# Patient Record
Sex: Male | Born: 1945 | Race: White | Hispanic: No | Smoking: Former smoker
Health system: Southern US, Community
[De-identification: ages and names within clinical notes are randomized; demographics above are authoritative.]

## PROBLEM LIST (undated history)

## (undated) DIAGNOSIS — F1911 Other psychoactive substance abuse, in remission: Secondary | ICD-10-CM

## (undated) DIAGNOSIS — G8929 Other chronic pain: Secondary | ICD-10-CM

## (undated) DIAGNOSIS — Z95810 Presence of automatic (implantable) cardiac defibrillator: Secondary | ICD-10-CM

## (undated) DIAGNOSIS — E079 Disorder of thyroid, unspecified: Secondary | ICD-10-CM

## (undated) DIAGNOSIS — K635 Polyp of colon: Secondary | ICD-10-CM

## (undated) DIAGNOSIS — F329 Major depressive disorder, single episode, unspecified: Secondary | ICD-10-CM

## (undated) DIAGNOSIS — M545 Low back pain, unspecified: Secondary | ICD-10-CM

## (undated) DIAGNOSIS — K219 Gastro-esophageal reflux disease without esophagitis: Secondary | ICD-10-CM

## (undated) DIAGNOSIS — L03115 Cellulitis of right lower limb: Secondary | ICD-10-CM

## (undated) DIAGNOSIS — Z8674 Personal history of sudden cardiac arrest: Secondary | ICD-10-CM

## (undated) DIAGNOSIS — K5790 Diverticulosis of intestine, part unspecified, without perforation or abscess without bleeding: Secondary | ICD-10-CM

## (undated) DIAGNOSIS — L02415 Cutaneous abscess of right lower limb: Secondary | ICD-10-CM

## (undated) DIAGNOSIS — I10 Essential (primary) hypertension: Secondary | ICD-10-CM

## (undated) DIAGNOSIS — H269 Unspecified cataract: Secondary | ICD-10-CM

## (undated) DIAGNOSIS — F32A Depression, unspecified: Secondary | ICD-10-CM

## (undated) DIAGNOSIS — G4700 Insomnia, unspecified: Secondary | ICD-10-CM

## (undated) DIAGNOSIS — B1920 Unspecified viral hepatitis C without hepatic coma: Secondary | ICD-10-CM

## (undated) HISTORY — DX: Diverticulosis of intestine, part unspecified, without perforation or abscess without bleeding: K57.90

## (undated) HISTORY — DX: Polyp of colon: K63.5

## (undated) HISTORY — DX: Gastro-esophageal reflux disease without esophagitis: K21.9

## (undated) HISTORY — DX: Disorder of thyroid, unspecified: E07.9

## (undated) HISTORY — PX: JOINT REPLACEMENT: SHX530

## (undated) HISTORY — DX: Depression, unspecified: F32.A

## (undated) HISTORY — DX: Low back pain, unspecified: M54.50

## (undated) HISTORY — DX: Major depressive disorder, single episode, unspecified: F32.9

## (undated) HISTORY — DX: Other chronic pain: G89.29

## (undated) HISTORY — DX: Low back pain: M54.5

## (undated) HISTORY — DX: Insomnia, unspecified: G47.00

## (undated) HISTORY — PX: CARDIAC CATHETERIZATION: SHX172

## (undated) HISTORY — DX: Unspecified cataract: H26.9

## (undated) HISTORY — DX: Essential (primary) hypertension: I10

---

## 2003-11-24 ENCOUNTER — Emergency Department (HOSPITAL_COMMUNITY): Admission: EM | Admit: 2003-11-24 | Discharge: 2003-11-24 | Payer: Self-pay | Admitting: Emergency Medicine

## 2003-12-07 ENCOUNTER — Ambulatory Visit: Payer: Self-pay | Admitting: Internal Medicine

## 2003-12-12 ENCOUNTER — Ambulatory Visit: Payer: Self-pay | Admitting: Internal Medicine

## 2003-12-13 ENCOUNTER — Ambulatory Visit: Payer: Self-pay | Admitting: *Deleted

## 2004-01-28 ENCOUNTER — Ambulatory Visit: Payer: Self-pay | Admitting: Internal Medicine

## 2004-01-29 ENCOUNTER — Ambulatory Visit: Payer: Self-pay | Admitting: Internal Medicine

## 2004-03-12 ENCOUNTER — Ambulatory Visit: Payer: Self-pay | Admitting: Internal Medicine

## 2004-04-10 ENCOUNTER — Ambulatory Visit: Payer: Self-pay | Admitting: Internal Medicine

## 2004-05-19 ENCOUNTER — Ambulatory Visit: Payer: Self-pay | Admitting: Internal Medicine

## 2004-05-20 ENCOUNTER — Ambulatory Visit (HOSPITAL_COMMUNITY): Admission: RE | Admit: 2004-05-20 | Discharge: 2004-05-20 | Payer: Self-pay | Admitting: Internal Medicine

## 2004-06-13 ENCOUNTER — Ambulatory Visit: Payer: Self-pay | Admitting: Internal Medicine

## 2004-07-03 ENCOUNTER — Ambulatory Visit: Payer: Self-pay | Admitting: Internal Medicine

## 2004-07-22 ENCOUNTER — Ambulatory Visit: Payer: Self-pay | Admitting: Internal Medicine

## 2004-07-30 ENCOUNTER — Encounter: Payer: Self-pay | Admitting: Cardiology

## 2004-07-30 ENCOUNTER — Ambulatory Visit (HOSPITAL_COMMUNITY): Admission: RE | Admit: 2004-07-30 | Discharge: 2004-07-30 | Payer: Self-pay | Admitting: Internal Medicine

## 2004-07-30 ENCOUNTER — Ambulatory Visit: Payer: Self-pay | Admitting: Cardiology

## 2004-08-14 ENCOUNTER — Ambulatory Visit: Payer: Self-pay | Admitting: Internal Medicine

## 2004-09-18 ENCOUNTER — Ambulatory Visit: Payer: Self-pay | Admitting: Nurse Practitioner

## 2004-09-18 ENCOUNTER — Ambulatory Visit (HOSPITAL_COMMUNITY): Admission: RE | Admit: 2004-09-18 | Discharge: 2004-09-18 | Payer: Self-pay | Admitting: Nurse Practitioner

## 2004-09-24 ENCOUNTER — Ambulatory Visit: Payer: Self-pay | Admitting: Internal Medicine

## 2004-09-30 ENCOUNTER — Ambulatory Visit (HOSPITAL_COMMUNITY): Admission: RE | Admit: 2004-09-30 | Discharge: 2004-09-30 | Payer: Self-pay

## 2004-10-16 ENCOUNTER — Ambulatory Visit: Payer: Self-pay | Admitting: Internal Medicine

## 2004-10-23 ENCOUNTER — Ambulatory Visit: Payer: Self-pay | Admitting: Internal Medicine

## 2004-12-25 ENCOUNTER — Ambulatory Visit: Payer: Self-pay | Admitting: Family Medicine

## 2004-12-29 ENCOUNTER — Ambulatory Visit: Payer: Self-pay | Admitting: Internal Medicine

## 2005-05-18 ENCOUNTER — Ambulatory Visit: Payer: Self-pay | Admitting: Internal Medicine

## 2005-06-23 ENCOUNTER — Ambulatory Visit (HOSPITAL_COMMUNITY): Admission: RE | Admit: 2005-06-23 | Discharge: 2005-06-23 | Payer: Self-pay | Admitting: Internal Medicine

## 2005-08-04 ENCOUNTER — Ambulatory Visit: Payer: Self-pay | Admitting: Internal Medicine

## 2005-08-20 ENCOUNTER — Ambulatory Visit: Payer: Self-pay | Admitting: Internal Medicine

## 2005-08-20 ENCOUNTER — Ambulatory Visit (HOSPITAL_COMMUNITY): Admission: RE | Admit: 2005-08-20 | Discharge: 2005-08-20 | Payer: Self-pay | Admitting: Internal Medicine

## 2005-09-04 ENCOUNTER — Ambulatory Visit: Payer: Self-pay | Admitting: *Deleted

## 2005-11-25 ENCOUNTER — Encounter (INDEPENDENT_AMBULATORY_CARE_PROVIDER_SITE_OTHER): Payer: Self-pay | Admitting: Internal Medicine

## 2005-11-25 LAB — CONVERTED CEMR LAB: PSA: 0.72 ng/mL

## 2005-12-07 ENCOUNTER — Ambulatory Visit: Payer: Self-pay | Admitting: Internal Medicine

## 2006-09-20 ENCOUNTER — Encounter (INDEPENDENT_AMBULATORY_CARE_PROVIDER_SITE_OTHER): Payer: Self-pay | Admitting: Internal Medicine

## 2006-09-20 DIAGNOSIS — E039 Hypothyroidism, unspecified: Secondary | ICD-10-CM | POA: Insufficient documentation

## 2006-09-20 DIAGNOSIS — F17200 Nicotine dependence, unspecified, uncomplicated: Secondary | ICD-10-CM | POA: Insufficient documentation

## 2006-09-20 DIAGNOSIS — Z8679 Personal history of other diseases of the circulatory system: Secondary | ICD-10-CM | POA: Insufficient documentation

## 2006-09-20 DIAGNOSIS — M2550 Pain in unspecified joint: Secondary | ICD-10-CM | POA: Insufficient documentation

## 2006-09-20 DIAGNOSIS — I10 Essential (primary) hypertension: Secondary | ICD-10-CM | POA: Insufficient documentation

## 2006-12-08 ENCOUNTER — Encounter (INDEPENDENT_AMBULATORY_CARE_PROVIDER_SITE_OTHER): Payer: Self-pay | Admitting: *Deleted

## 2008-03-23 HISTORY — PX: TOTAL SHOULDER REPLACEMENT: SUR1217

## 2009-03-05 ENCOUNTER — Inpatient Hospital Stay (HOSPITAL_COMMUNITY): Admission: RE | Admit: 2009-03-05 | Discharge: 2009-03-08 | Payer: Self-pay | Admitting: Orthopedic Surgery

## 2010-06-23 LAB — CBC
Hemoglobin: 9.8 g/dL — ABNORMAL LOW (ref 13.0–17.0)
MCHC: 34.8 g/dL (ref 30.0–36.0)
MCV: 92.1 fL (ref 78.0–100.0)
RBC: 3.04 MIL/uL — ABNORMAL LOW (ref 4.22–5.81)
WBC: 7 10*3/uL (ref 4.0–10.5)

## 2010-06-23 LAB — BASIC METABOLIC PANEL
CO2: 26 mEq/L (ref 19–32)
Calcium: 7.8 mg/dL — ABNORMAL LOW (ref 8.4–10.5)
Chloride: 104 mEq/L (ref 96–112)
GFR calc Af Amer: >60 mL/min (ref >60)
Sodium: 134 mEq/L — ABNORMAL LOW (ref 135–145)

## 2010-06-24 LAB — URINE MICROSCOPIC-ADD ON

## 2010-06-24 LAB — ANAEROBIC CULTURE

## 2010-06-24 LAB — URINALYSIS, ROUTINE W REFLEX MICROSCOPIC
Bilirubin Urine: NEGATIVE
Glucose, UA: NEGATIVE mg/dL
Hgb urine dipstick: NEGATIVE
Ketones, ur: NEGATIVE mg/dL
Nitrite: NEGATIVE
Specific Gravity, Urine: 1.018 (ref 1.005–1.030)
pH: 7 (ref 5.0–8.0)

## 2010-06-24 LAB — HEPATIC FUNCTION PANEL
Albumin: 3.6 g/dL (ref 3.5–5.2)
Alkaline Phosphatase: 85 U/L (ref 39–117)
Bilirubin, Direct: 0.3 mg/dL (ref 0.0–0.3)
Total Bilirubin: 0.9 mg/dL (ref 0.3–1.2)

## 2010-06-24 LAB — CBC
Hemoglobin: 15.7 g/dL (ref 13.0–17.0)
MCHC: 35.4 g/dL (ref 30.0–36.0)
MCV: 90.7 fL (ref 78.0–100.0)
Platelets: 177 10*3/uL (ref 150–400)
RBC: 4.9 MIL/uL (ref 4.22–5.81)
RDW: 13.7 % (ref 11.5–15.5)
RDW: 13.8 % (ref 11.5–15.5)
WBC: 6.4 10*3/uL (ref 4.0–10.5)

## 2010-06-24 LAB — BASIC METABOLIC PANEL
BUN: 8 mg/dL (ref 6–23)
BUN: 8 mg/dL (ref 6–23)
CO2: 27 mEq/L (ref 19–32)
CO2: 30 mEq/L (ref 19–32)
Calcium: 7.5 mg/dL — ABNORMAL LOW (ref 8.4–10.5)
Calcium: 7.8 mg/dL — ABNORMAL LOW (ref 8.4–10.5)
Calcium: 7.8 mg/dL — ABNORMAL LOW (ref 8.4–10.5)
Calcium: 9 mg/dL (ref 8.4–10.5)
Chloride: 101 mEq/L (ref 96–112)
Creatinine, Ser: 0.78 mg/dL (ref 0.4–1.5)
Creatinine, Ser: 0.85 mg/dL (ref 0.4–1.5)
GFR calc Af Amer: >60 mL/min (ref >60)
GFR calc Af Amer: >60 mL/min (ref >60)
GFR calc Af Amer: >60 mL/min (ref >60)
GFR calc non Af Amer: >60 mL/min (ref >60)
GFR calc non Af Amer: >60 mL/min (ref >60)
Glucose, Bld: 143 mg/dL — ABNORMAL HIGH (ref 70–99)
Glucose, Bld: 150 mg/dL — ABNORMAL HIGH (ref 70–99)
Glucose, Bld: 93 mg/dL (ref 70–99)
Sodium: 132 mEq/L — ABNORMAL LOW (ref 135–145)
Sodium: 132 mEq/L — ABNORMAL LOW (ref 135–145)
Sodium: 137 mEq/L (ref 135–145)

## 2010-06-24 LAB — DIFFERENTIAL
Basophils Absolute: 0 10*3/uL (ref 0.0–0.1)
Basophils Relative: 1 % (ref 0–1)
Eosinophils Absolute: 0.1 10*3/uL (ref 0.0–0.7)
Eosinophils Relative: 2 % (ref 0–5)
Monocytes Absolute: 0.6 10*3/uL (ref 0.1–1.0)
Monocytes Relative: 13 % — ABNORMAL HIGH (ref 3–12)
Neutro Abs: 2.9 10*3/uL (ref 1.7–7.7)

## 2010-06-24 LAB — WOUND CULTURE

## 2010-06-24 LAB — TYPE AND SCREEN: Antibody Screen: NEGATIVE

## 2010-06-24 LAB — APTT: aPTT: 28 seconds (ref 24–37)

## 2010-09-22 LAB — HM COLONOSCOPY

## 2010-11-27 ENCOUNTER — Ambulatory Visit (HOSPITAL_COMMUNITY): Payer: Self-pay | Admitting: Dentistry

## 2010-11-27 DIAGNOSIS — Z09 Encounter for follow-up examination after completed treatment for conditions other than malignant neoplasm: Secondary | ICD-10-CM

## 2012-06-11 ENCOUNTER — Other Ambulatory Visit: Payer: Self-pay | Admitting: Physician Assistant

## 2012-06-20 ENCOUNTER — Other Ambulatory Visit: Payer: Self-pay | Admitting: Physician Assistant

## 2012-06-20 DIAGNOSIS — G4700 Insomnia, unspecified: Secondary | ICD-10-CM

## 2012-06-20 NOTE — Telephone Encounter (Signed)
Medication refilled per protocol.Patient needs to be seen before any further refills 

## 2012-06-21 ENCOUNTER — Other Ambulatory Visit: Payer: Self-pay | Admitting: Physician Assistant

## 2012-06-21 NOTE — Telephone Encounter (Signed)
Refill 3/31  denied

## 2012-06-22 ENCOUNTER — Encounter: Payer: Self-pay | Admitting: Family Medicine

## 2012-06-22 NOTE — Telephone Encounter (Signed)
Can you make erroneous encounter please. This was done the other day. Thanks!

## 2012-06-22 NOTE — Telephone Encounter (Signed)
This encounter was created in error - please disregard.

## 2012-07-19 ENCOUNTER — Other Ambulatory Visit: Payer: Self-pay | Admitting: Physician Assistant

## 2012-07-20 ENCOUNTER — Other Ambulatory Visit: Payer: Self-pay | Admitting: Physician Assistant

## 2012-07-20 NOTE — Telephone Encounter (Signed)
Medication refilled per protocol. 

## 2012-09-05 ENCOUNTER — Other Ambulatory Visit: Payer: Self-pay | Admitting: Physician Assistant

## 2012-09-05 NOTE — Telephone Encounter (Signed)
Medication refilled per protocol.Patient needs to be seen before any further refills  Pt called appt made 

## 2012-09-09 ENCOUNTER — Other Ambulatory Visit: Payer: Self-pay | Admitting: Family Medicine

## 2012-09-09 ENCOUNTER — Encounter: Payer: Self-pay | Admitting: Family Medicine

## 2012-09-09 ENCOUNTER — Ambulatory Visit (INDEPENDENT_AMBULATORY_CARE_PROVIDER_SITE_OTHER): Payer: BC Managed Care – PPO | Admitting: Family Medicine

## 2012-09-09 VITALS — BP 110/72 | HR 68 | Temp 97.5°F | Resp 16 | Ht 68.5 in | Wt 187.0 lb

## 2012-09-09 DIAGNOSIS — K219 Gastro-esophageal reflux disease without esophagitis: Secondary | ICD-10-CM

## 2012-09-09 DIAGNOSIS — IMO0002 Reserved for concepts with insufficient information to code with codable children: Secondary | ICD-10-CM

## 2012-09-09 DIAGNOSIS — J449 Chronic obstructive pulmonary disease, unspecified: Secondary | ICD-10-CM

## 2012-09-09 DIAGNOSIS — G4700 Insomnia, unspecified: Secondary | ICD-10-CM

## 2012-09-09 DIAGNOSIS — T148XXA Other injury of unspecified body region, initial encounter: Secondary | ICD-10-CM

## 2012-09-09 DIAGNOSIS — I10 Essential (primary) hypertension: Secondary | ICD-10-CM

## 2012-09-09 DIAGNOSIS — E039 Hypothyroidism, unspecified: Secondary | ICD-10-CM

## 2012-09-09 MED ORDER — TEMAZEPAM 15 MG PO CAPS: 15.0000 mg | ORAL_CAPSULE | Freq: Every evening | ORAL | Status: DC | PRN

## 2012-09-09 NOTE — Patient Instructions (Addendum)
We will call with lab results New sleeping pill Temazepam at bedtime Stop trazodone F/U 3 months for CPE

## 2012-09-10 ENCOUNTER — Other Ambulatory Visit: Payer: Self-pay | Admitting: Family Medicine

## 2012-09-10 LAB — COMPREHENSIVE METABOLIC PANEL
ALT: 57 U/L — ABNORMAL HIGH (ref 0–53)
BUN: 17 mg/dL (ref 6–23)
CO2: 29 mEq/L (ref 19–32)
Calcium: 8.9 mg/dL (ref 8.4–10.5)
Creat: 0.85 mg/dL (ref 0.50–1.35)
Total Bilirubin: 0.7 mg/dL (ref 0.3–1.2)

## 2012-09-10 LAB — CBC
HCT: 39.3 % (ref 39.0–52.0)
Hemoglobin: 13.4 g/dL (ref 13.0–17.0)
MCV: 87.7 fL (ref 78.0–100.0)
RBC: 4.48 MIL/uL (ref 4.22–5.81)
RDW: 13.7 % (ref 11.5–15.5)
WBC: 5.3 10*3/uL (ref 4.0–10.5)

## 2012-09-10 LAB — T4: T4, Total: 23.8 ug/dL — ABNORMAL HIGH (ref 5.0–12.5)

## 2012-09-11 ENCOUNTER — Encounter: Payer: Self-pay | Admitting: Family Medicine

## 2012-09-11 DIAGNOSIS — K219 Gastro-esophageal reflux disease without esophagitis: Secondary | ICD-10-CM | POA: Insufficient documentation

## 2012-09-11 DIAGNOSIS — IMO0002 Reserved for concepts with insufficient information to code with codable children: Secondary | ICD-10-CM | POA: Insufficient documentation

## 2012-09-11 DIAGNOSIS — G4700 Insomnia, unspecified: Secondary | ICD-10-CM | POA: Insufficient documentation

## 2012-09-11 DIAGNOSIS — B1920 Unspecified viral hepatitis C without hepatic coma: Secondary | ICD-10-CM | POA: Insufficient documentation

## 2012-09-11 DIAGNOSIS — J449 Chronic obstructive pulmonary disease, unspecified: Secondary | ICD-10-CM | POA: Insufficient documentation

## 2012-09-11 NOTE — Assessment & Plan Note (Signed)
Currently stable, declines therapy, quit tobacco

## 2012-09-11 NOTE — Assessment & Plan Note (Signed)
Well controlled no change to meds 

## 2012-09-11 NOTE — Assessment & Plan Note (Signed)
Check TFT ,continue synthroid 

## 2012-09-11 NOTE — Assessment & Plan Note (Addendum)
Trial of restoril, stop trazodone Failed ambien, trazodone, could not afford lunesta Other option doxepin or elavil

## 2012-09-11 NOTE — Assessment & Plan Note (Signed)
Pt wishes to use OTC prilosec

## 2012-09-11 NOTE — Assessment & Plan Note (Signed)
Keep clean and bandage

## 2012-09-11 NOTE — Progress Notes (Signed)
  Subjective:    Patient ID: Tommy Robbins, male    DOB: 1945-04-03, 67 y.o.   MRN: [id number]  HPI Pt here to f/u chronic medical problems. He has not been seen in 1 year. Still taking all prescribed medications with exception of trazodone dose. He continues to have insomnia, had SE with ambien, trazodone has not helped so he started tapering off months ago, now down to 50mg  at bedtime.  Due for labs. History of HEP C no current treatment, had interferon in [facility], was receiving treatment 8 years ago when he moved here had SE therefore did not continue treatment. COPD- breathing okay, gets SOB with exertion, could not afford inhalers therefore does not use any.  Still under workman's comp for shoulder injury, in pain clinic getting MS Contin twice a day GERD- Stopped his meds, still has some heartburn  Cut skin on plastic bag a few weeks ago  Review of Systems  GEN- denies fatigue, fever, weight loss,weakness, recent illness HEENT- denies eye drainage, change in vision, nasal discharge, CVS- denies chest pain, palpitations RESP- denies SOB, cough, wheeze ABD- denies N/V, change in stools, abd pain GU- denies dysuria, hematuria, dribbling, incontinence MSK- denies joint pain, muscle aches, injury Neuro- denies headache, dizziness, syncope, seizure activity       Objective:   Physical Exam  GEN- NAD, alert and oriented x3 HEENT- PERRL, EOMI, non injected sclera, pink conjunctiva, MMM, oropharynx clear Neck- Supple, no thyromegaly CVS- RRR, no murmur RESP-CTAB ABD-NABS,soft,NT,ND EXT- No edema Pulses- Radial, DP- 2+ Psych- normal affect and mood Skin- Right forearm- skin tear, no drainage      Assessment & Plan:

## 2012-09-11 NOTE — Assessment & Plan Note (Signed)
Known Hep C had interferon treatments > 8 years, previous LFT elevated , repeat

## 2012-09-12 LAB — HEPATITIS C RNA QUANTITATIVE

## 2012-09-13 ENCOUNTER — Other Ambulatory Visit: Payer: Self-pay | Admitting: Family Medicine

## 2012-10-01 ENCOUNTER — Other Ambulatory Visit: Payer: Self-pay | Admitting: Physician Assistant

## 2012-10-15 ENCOUNTER — Other Ambulatory Visit: Payer: Self-pay | Admitting: Physician Assistant

## 2012-10-17 ENCOUNTER — Other Ambulatory Visit: Payer: Self-pay | Admitting: Physician Assistant

## 2012-10-17 NOTE — Telephone Encounter (Signed)
Medication refilled per protocol. 

## 2012-10-29 ENCOUNTER — Other Ambulatory Visit: Payer: Self-pay | Admitting: Physician Assistant

## 2012-10-31 ENCOUNTER — Encounter: Payer: Self-pay | Admitting: Family Medicine

## 2012-10-31 NOTE — Telephone Encounter (Signed)
Medication refill for one time only.  Patient needs to be seen.  Letter sent for patient to call and schedule 

## 2012-11-09 ENCOUNTER — Ambulatory Visit (INDEPENDENT_AMBULATORY_CARE_PROVIDER_SITE_OTHER): Payer: BC Managed Care – PPO | Admitting: Physician Assistant

## 2012-11-09 ENCOUNTER — Encounter: Payer: Self-pay | Admitting: Physician Assistant

## 2012-11-09 VITALS — BP 106/82 | HR 76 | Temp 98.4°F | Resp 20 | Ht 68.5 in | Wt 186.0 lb

## 2012-11-09 DIAGNOSIS — J449 Chronic obstructive pulmonary disease, unspecified: Secondary | ICD-10-CM

## 2012-11-09 DIAGNOSIS — B1920 Unspecified viral hepatitis C without hepatic coma: Secondary | ICD-10-CM

## 2012-11-09 DIAGNOSIS — K219 Gastro-esophageal reflux disease without esophagitis: Secondary | ICD-10-CM

## 2012-11-09 DIAGNOSIS — I10 Essential (primary) hypertension: Secondary | ICD-10-CM

## 2012-11-09 DIAGNOSIS — E039 Hypothyroidism, unspecified: Secondary | ICD-10-CM

## 2012-11-09 DIAGNOSIS — G4700 Insomnia, unspecified: Secondary | ICD-10-CM

## 2012-11-09 MED ORDER — AMLODIPINE BESYLATE 10 MG PO TABS: ORAL_TABLET | ORAL | Status: DC

## 2012-11-09 MED ORDER — POTASSIUM CHLORIDE CRYS ER 10 MEQ PO TBCR: EXTENDED_RELEASE_TABLET | ORAL | Status: DC

## 2012-11-09 MED ORDER — LISINOPRIL-HYDROCHLOROTHIAZIDE 20-12.5 MG PO TABS: ORAL_TABLET | ORAL | Status: DC

## 2012-11-09 MED ORDER — LEVOTHYROXINE SODIUM 50 MCG PO TABS: 50.0000 ug | ORAL_TABLET | Freq: Every day | ORAL | Status: DC

## 2012-11-09 NOTE — Progress Notes (Signed)
Patient ID: Tommy Robbins MRN: [id number], DOB: 03-23-1946, 67 y.o. Date of Encounter: @DATE @  Chief Complaint:  Chief Complaint  Patient presents with  . told needed appt to follow up???    ??thyroid  needs med refill 90 day    HPI: 67 y.o. year old white male  presents for regular office visit and followup. He has no specific complaints today.  #1 insomnia: He says in the past Ambien worked very well as far as him getting good sleep. However it caused him to have very weird dreams.  He prefers not to go back to Ambien. At his last office visit here with Dr. Jeanice Lim in June she started a trial of Restoril. He says that this is working well and he was continued this for now.  #2 hypothyroid: he is taking this as directed. No complaints of significant change in weight or energy level or changes in hair or skin.  #3 history of hepatitis C: He had interferon treatments in [place] in the past. He was receiving treatment for 8 years ago when he moved here. However he then developed adverse effects to the treatment and stopped the treatment.  #4 COPD he was prescribed some inhalers use in the past he cannot afford these. He has stopped smoking. His shortness of breath and dyspnea on exertion are stable even without treatment.  5 chronic shoulder pain he sees the pain clinic.    Past Medical History  Diagnosis Date  . Hypertension   . GERD (gastroesophageal reflux disease)   . Depression   . Hep C w/ coma, chronic   . Thyroid disease     hypothyroid  . Colon polyps   . Diverticulosis   . Insomnia      Home Meds: See attached medication section for current medication list. Any medications entered into computer today will not appear on this note's list. The medications listed below were entered prior to today. Current Outpatient Prescriptions on File Prior to Visit  Medication Sig Dispense Refill  . morphine (MS CONTIN) 15 MG 12 hr tablet Take 15 mg by mouth 3 (three) times daily.  Takes 75mg  BID      . morphine (MS CONTIN) 60 MG 12 hr tablet Take 60 mg by mouth 3 (three) times daily.       . temazepam (RESTORIL) 15 MG capsule Take 1 capsule (15 mg total) by mouth at bedtime as needed for sleep.  30 capsule  3  . traZODone (DESYREL) 100 MG tablet        No current facility-administered medications on file prior to visit.    Allergies: No Known Allergies  History   Social History  . Marital Status: Divorced    Spouse Name: N/A    Number of Children: N/A  . Years of Education: N/A   Occupational History  . Not on file.   Social History Main Topics  . Smoking status: Former Smoker -- 1.00 packs/day for 50 years    Types: Cigarettes    Quit date: 06/10/2011  . Smokeless tobacco: Not on file  . Alcohol Use: Not on file  . Drug Use: Not on file  . Sexual Activity: Not on file   Other Topics Concern  . Not on file   Social History Narrative  . No narrative on file    No family history on file.   Review of Systems:  See HPI for pertinent ROS. All other ROS negative.    Physical Exam: Blood  pressure 106/82, pulse 76, temperature 98.4 F (36.9 C), temperature source Oral, resp. rate 20, height 5' 8.5" (1.74 m), weight 186 lb (84.369 kg)., Body mass index is 27.87 kg/(m^2). General: well-nourished well-developed white male. Appears in no acute distress. Neck: Supple. No thyromegaly. No lymphadenopathy. Lungs: Clear bilaterally to auscultation without wheezes, rales, or rhonchi. Breathing is unlabored. Heart: RRR with S1 S2. No murmurs, rubs, or gallops. Abdomen: Soft, non-tender, non-distended with normoactive bowel sounds. No hepatomegaly. No rebound/guarding. No obvious abdominal masses. Musculoskeletal:  Strength and tone normal for age. Neuro: Alert and oriented X 3. Moves all extremities spontaneously. Gait is normal. CNII-XII grossly in tact. Psych:  Responds to questions appropriately with a normal affect.     ASSESSMENT AND PLAN:  67 y.o.  year old male with  1. HYPERTENSION, BENIGN ESSENTIAL At goal. Continue current medications. Bmet normal 6/14. - amLODipine (NORVASC) 10 MG tablet; TAKE ONE TABLET BY MOUTH ONCE DAILY  Dispense: 90 tablet; Refill: 3 - potassium chloride (KLOR-CON M10) 10 MEQ tablet; TAKE ONE TABLET BY MOUTH EVERY DAY  Dispense: 90 tablet; Refill: 3 - lisinopril-hydrochlorothiazide (PRINZIDE,ZESTORETIC) 20-12.5 MG per tablet; TAKE TWO TABLETS BY MOUTH ONCE DAILY  Dispense: 180 tablet; Refill: 3  2. HYPOTHYROIDISM NOS TSH within normal limits 614. Continue current dose. - levothyroxine (SYNTHROID, LEVOTHROID) 50 MCG tablet; Take 1 tablet (50 mcg total) by mouth daily before breakfast.  Dispense: 90 tablet; Refill: 3  3. Insomnia Continue Restoril when necessary.  4. COPD (chronic obstructive pulmonary disease) Stable off of medication. He did stop smoking one year ago and has remained off of cigarettes.  5. GERD (gastroesophageal reflux disease) Controlled with current medication.  6. Hepatitis C He did not complete therapy for this in the past secondary to adverse effects.   Murray Hodgkins [place], [place], [facility] 11/09/2012 3:13 PM

## 2013-01-04 ENCOUNTER — Telehealth: Payer: Self-pay | Admitting: Family Medicine

## 2013-01-04 MED ORDER — TEMAZEPAM 15 MG PO CAPS: 15.0000 mg | ORAL_CAPSULE | Freq: Every evening | ORAL | Status: DC | PRN

## 2013-01-04 NOTE — Telephone Encounter (Signed)
Okay to refill? 

## 2013-01-04 NOTE — Telephone Encounter (Signed)
Med phoned in °

## 2013-02-25 ENCOUNTER — Other Ambulatory Visit: Payer: Self-pay | Admitting: Physician Assistant

## 2013-02-27 NOTE — Telephone Encounter (Signed)
Medication refilled per protocol. 

## 2013-03-06 ENCOUNTER — Telehealth: Payer: Self-pay | Admitting: Family Medicine

## 2013-03-06 NOTE — Telephone Encounter (Signed)
Pt call back number [phone] Pt is needing you to call him because he states that the pharmacy told him that he need to call up here and tell us to call his insurance company about his medication (omeprazole)

## 2013-03-07 NOTE — Telephone Encounter (Signed)
Pt is calling back from yesterday and he would love a call back today he said

## 2013-03-08 NOTE — Telephone Encounter (Signed)
Called number listed the number is disconnected

## 2013-03-10 NOTE — Telephone Encounter (Signed)
Insurance still could not give me an answer.  States now it must go to a Well[employer] geologist and we will have answer in 24-48 hrs.  Pt aware of status

## 2013-03-10 NOTE — Telephone Encounter (Signed)
Still have not received prior auth from [employer].  Was sent in 03/01/13.  Told patient to call insurance to follow up on PA

## 2013-03-10 NOTE — Telephone Encounter (Signed)
I have contacted insurance company.  They are having problems at their end.  Told them we have been working on this for TWO weeks.  Still unable to give me an answer on OMEPRAZOLE!!!!! They asked that I try to call back in 3-4 hrs.  Pt informed

## 2013-03-21 ENCOUNTER — Telehealth: Payer: Self-pay | Admitting: Family Medicine

## 2013-03-21 MED ORDER — PANTOPRAZOLE SODIUM 40 MG PO TBEC: 40.0000 mg | DELAYED_RELEASE_TABLET | Freq: Every day | ORAL | Status: DC

## 2013-03-21 NOTE — Telephone Encounter (Signed)
Finally heard from insurance about prior auth for Omeprazole.  Omeprazole denied due to patient symptomtology.  Require high dose H2 blocker.  Per provider Protonix 40 mg to be ordered.  Pt called and made aware

## 2013-05-10 ENCOUNTER — Ambulatory Visit (INDEPENDENT_AMBULATORY_CARE_PROVIDER_SITE_OTHER): Payer: BC Managed Care – PPO | Admitting: Family Medicine

## 2013-05-10 ENCOUNTER — Telehealth: Payer: Self-pay | Admitting: *Deleted

## 2013-05-10 ENCOUNTER — Encounter: Payer: Self-pay | Admitting: Family Medicine

## 2013-05-10 VITALS — BP 108/70 | HR 70 | Temp 97.8°F | Resp 18 | Ht 67.0 in | Wt 185.0 lb

## 2013-05-10 DIAGNOSIS — J441 Chronic obstructive pulmonary disease with (acute) exacerbation: Secondary | ICD-10-CM

## 2013-05-10 MED ORDER — ALBUTEROL SULFATE HFA 108 (90 BASE) MCG/ACT IN AERS: 2.0000 | INHALATION_SPRAY | RESPIRATORY_TRACT | Status: DC | PRN

## 2013-05-10 MED ORDER — PREDNISONE 10 MG PO TABS: ORAL_TABLET | ORAL | Status: DC

## 2013-05-10 MED ORDER — DOXYCYCLINE HYCLATE 100 MG PO TABS: 100.0000 mg | ORAL_TABLET | Freq: Two times a day (BID) | ORAL | Status: DC

## 2013-05-10 MED ORDER — METHYLPREDNISOLONE ACETATE 40 MG/ML IJ SUSP
40.0000 mg | Freq: Once | INTRAMUSCULAR | Status: AC
  Administered 2013-05-10: 40 mg via INTRAMUSCULAR

## 2013-05-10 NOTE — Telephone Encounter (Signed)
Received VM from pt. Returned call. Reported that he has SOB and cough. Requested appointment. Appointment scheduled for 05/10/2013 @ 11:30am.

## 2013-05-10 NOTE — Progress Notes (Signed)
Patient ID: Tommy Robbins, male   DOB: Oct 27, 1945, 68 y.o.   MRN: [id number]   Subjective:    Patient ID: Tommy Robbins, male    DOB: 1945-08-12, 68 y.o.   MRN: [id number]  Patient presents for Cough and Shortness of Breath  patient here with cough shortness of breath and wheezing for the past 3 days. 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 He's not been on any maintenance inhalers for the past 2 years. He states that he was out in the cold on Sunday move in some wood when he began to have some cough. It subsequently progressed to shortness of breath and wheezing when he had difficulty walking up the steps. He has not taken any over-the-counter medications. He has not had any fever. He's not had any chest pain.    Review Of Systems:  GEN- denies fatigue, fever, weight loss,weakness, recent illness HEENT- denies eye drainage, change in vision, nasal discharge, CVS- denies chest pain, palpitations RESP- +SOB, +cough, +wheeze ABD- denies N/V, change in stools, abd pain Neuro- denies headache, dizziness, syncope, seizure activity       Objective:    BP 108/70  Pulse 70  Temp(Src) 97.8 F (36.6 C) (Oral)  Resp 18  Ht 5\' 7"  (1.702 m)  Wt 185 lb (83.915 kg)  BMI 28.97 kg/m2  SpO2 94% GEN- NAD, alert and oriented x3 HEENT- PERRL, EOMI, non injected sclera, pink conjunctiva, MMM, oropharynx clear Neck- Supple, no LAD CVS- RRR, no murmur RESP-decreased air movement bilat, +wheeze bilat, no retractions, sat 94%, +rhochi lower bases EXT- No edema Pulses- Radial 2+  S/p neb improved air movement, decreased wheeze, sat 94%      Assessment & Plan:      Problem List Items Addressed This Visit   COPD exacerbation - Primary     Given duo neb in the office. He was also given Depo-Medrol 40 mg IM for exacerbation. He will start prednisone taper tomorrow as well as doxycycline He's been given prescription for albuterol every 4 hours as needed He will followup Friday for recheck Will  likely need to start him on a maintenance medication such as Advair as he was on this in the past and did well on this.    Relevant Medications      predniSONE (DELTASONE) tablet      ALBUTEROL SULFATE HFA 108 (90 BASE) MCG/ACT IN AERS      Note: This dictation was prepared with Dragon dictation along with smaller phrase technology. Any transcriptional errors that result from this process are unintentional.

## 2013-05-10 NOTE — Addendum Note (Signed)
Addended by: Elvina Mattes T on: 05/10/2013 03:02 PM   Modules accepted: Orders

## 2013-05-10 NOTE — Patient Instructions (Signed)
Start prednisone tablets tomorrow Use the albuterol inhaler 2 puffs every 4 hours while awake Start antibiotics doxycycline today Followup on Friday for recheck on your breathing

## 2013-05-10 NOTE — Assessment & Plan Note (Signed)
Given duo neb in the office. He was also given Depo-Medrol 40 mg IM for exacerbation. He will start prednisone taper tomorrow as well as doxycycline He's been given prescription for albuterol every 4 hours as needed He will followup Friday for recheck Will likely need to start him on a maintenance medication such as Advair as he was on this in the past and did well on this.

## 2013-05-12 ENCOUNTER — Encounter: Payer: Self-pay | Admitting: Family Medicine

## 2013-05-12 ENCOUNTER — Telehealth: Payer: Self-pay | Admitting: *Deleted

## 2013-05-12 ENCOUNTER — Ambulatory Visit (INDEPENDENT_AMBULATORY_CARE_PROVIDER_SITE_OTHER): Payer: BC Managed Care – PPO | Admitting: Family Medicine

## 2013-05-12 VITALS — BP 120/70 | HR 90 | Temp 97.7°F | Resp 16 | Ht 67.0 in | Wt 184.0 lb

## 2013-05-12 DIAGNOSIS — J441 Chronic obstructive pulmonary disease with (acute) exacerbation: Secondary | ICD-10-CM

## 2013-05-12 MED ORDER — TEMAZEPAM 15 MG PO CAPS: 15.0000 mg | ORAL_CAPSULE | Freq: Every evening | ORAL | Status: DC | PRN

## 2013-05-12 NOTE — Telephone Encounter (Signed)
Okay, given 3 refills

## 2013-05-12 NOTE — Telephone Encounter (Signed)
Med phoned in °

## 2013-05-12 NOTE — Telephone Encounter (Signed)
Ok to refill 

## 2013-05-12 NOTE — Patient Instructions (Signed)
Continue the current medications Stay inside Okay to humidify the air  Mucinex or robitussin for the mucous Start advair on Monday Call if advair is helping then we will send prescriptions  F/U 2 months

## 2013-05-12 NOTE — Assessment & Plan Note (Signed)
His exam is much improved today I will have him continue the prednisone taper as well as antibiotics in the inhalers. He will start Advair 250/50 on Monday. We will followup in 2 weeks by phone to see how he is doing with the maintenance inhaler

## 2013-05-12 NOTE — Progress Notes (Signed)
Patient ID: Tommy Robbins, male   DOB: [birth year], 68 y.o.   MRN: [id number]   Subjective:    Patient ID: Tommy Robbins, male    DOB: 04/21/1945, 68 y.o.   MRN: [id number]  Patient presents for follow up from Wednesday and Chest Pain  Patient here for 48 hour followup for COPD exacerbation. He states that he still feels short of breath when he is walking up steps and try to do activities but he is not coughing and wheezing as much. He is also able to get some of the sputum up. He is using his inhaler as prescribed typically 3 times a day. He is currently on 40 mg of prednisone.   Review Of Systems:  GEN- denies fatigue, fever, weight loss,weakness, recent illness HEENT- denies eye drainage, change in vision, nasal discharge, CVS- denies chest pain, palpitations RESP- + SOB, +cough, +wheeze        Objective:    BP 120/70  Pulse 90  Temp(Src) 97.7 F (36.5 C) (Oral)  Resp 16  Ht 5\' 7"  (1.702 m)  Wt 184 lb (83.462 kg)  BMI 28.81 kg/m2  SpO2 94% GEN- NAD, alert and oriented x3 HEENT- PERRL, EOMI, non injected sclera, pink conjunctiva, MMM, oropharynx clear Neck- no retractions CVS- RRR, no murmur RESP-few scattered wheeze, good air movement, mild rhonchi walking sat 91%, at rest 95% Pulses- Radial 2+        Assessment & Plan:      Problem List Items Addressed This Visit   None      Note: This dictation was prepared with Dragon dictation along with smaller phrase technology. Any transcriptional errors that result from this process are unintentional.

## 2013-06-01 ENCOUNTER — Telehealth: Payer: Self-pay | Admitting: *Deleted

## 2013-06-01 MED ORDER — FLUTICASONE-SALMETEROL 250-50 MCG/DOSE IN AEPB: 1.0000 | INHALATION_SPRAY | Freq: Two times a day (BID) | RESPIRATORY_TRACT | Status: DC

## 2013-06-01 NOTE — Telephone Encounter (Signed)
Message copied by Phillips Odor on Thu Jun 01, 2013  9:38 AM ------      Message from: Milinda Antis F      Created: Wed May 31, 2013 10:29 PM      Regarding: FW: f/u advair, see if script needed       Please call pt and see if advair helped, if so go ahead and send in script, he was given samples from the office                  ----- Message -----         From: Salley Scarlet, MD         Sent: 05/29/2013           To: Salley Scarlet, MD      Subject: f/u advair, see if script needed                                ------

## 2013-06-01 NOTE — Telephone Encounter (Signed)
Call placed to patient.   States that he feels his breathing has improved with Advair.   Prescription sent to pharmacy.

## 2013-07-10 ENCOUNTER — Telehealth: Payer: Self-pay | Admitting: Family Medicine

## 2013-07-10 MED ORDER — PANTOPRAZOLE SODIUM 40 MG PO TBEC: 40.0000 mg | DELAYED_RELEASE_TABLET | Freq: Every day | ORAL | Status: DC

## 2013-07-10 NOTE — Telephone Encounter (Signed)
Call back number is [phone]  [phone] Pt is needing a refill on Amporzole

## 2013-07-10 NOTE — Telephone Encounter (Signed)
Refill appropriate and filled per protocol. 

## 2013-07-11 ENCOUNTER — Telehealth: Payer: Self-pay | Admitting: *Deleted

## 2013-07-11 MED ORDER — OMEPRAZOLE 20 MG PO CPDR: 20.0000 mg | DELAYED_RELEASE_CAPSULE | Freq: Every day | ORAL | Status: DC

## 2013-07-11 NOTE — Telephone Encounter (Signed)
Received fax from pharmacy stating Protonix is not covered by patient insurance and requesting PA.   MD made aware and new orders obtained to change prescription to Omeprazole.   Prescription sent to pharmacy.

## 2013-07-12 ENCOUNTER — Telehealth: Payer: Self-pay | Admitting: *Deleted

## 2013-07-12 MED ORDER — PANTOPRAZOLE SODIUM 40 MG PO TBEC: 40.0000 mg | DELAYED_RELEASE_TABLET | Freq: Every day | ORAL | Status: DC

## 2013-07-12 NOTE — Telephone Encounter (Signed)
Received PA request for Omeprazole from pharmacy.   Was advised that insurance does not cover daily Omeprazole.   Medication had been changed to Omeprazole from Protonix due to insurance not covering medication.   PA submitted for Protonix.

## 2013-07-18 NOTE — Telephone Encounter (Signed)
PA for Pantroprazole approved.   Case ID: [id number]  06/21/2013- 07/18/2014.  Pharmacy made aware.

## 2013-09-06 ENCOUNTER — Encounter: Payer: Self-pay | Admitting: Physician Assistant

## 2013-09-06 ENCOUNTER — Encounter: Payer: Self-pay | Admitting: Family Medicine

## 2013-09-06 ENCOUNTER — Ambulatory Visit (INDEPENDENT_AMBULATORY_CARE_PROVIDER_SITE_OTHER): Payer: BC Managed Care – PPO | Admitting: Physician Assistant

## 2013-09-06 VITALS — BP 110/74 | HR 68 | Temp 98.2°F | Resp 18 | Wt 192.0 lb

## 2013-09-06 DIAGNOSIS — B3789 Other sites of candidiasis: Secondary | ICD-10-CM

## 2013-09-06 MED ORDER — NYSTATIN 100000 UNIT/GM EX POWD: Freq: Four times a day (QID) | CUTANEOUS | Status: DC

## 2013-09-06 NOTE — Progress Notes (Signed)
Patient ID: Tommy MinerRichard M Robbins MRN: [id number], DOB: 10/18/1945, 68 y.o. Date of Encounter: 09/06/2013, 12:17 PM    Chief Complaint:  Chief Complaint  Patient presents with  . oozy rash in groin area     HPI: 68 y.o. year old white male is that this rash started on the morning of Monday 09/04/13. Started at the right groin and now also has the same type rash on the left groin. Says he has been doing some yard work and being outside sweaty quite a bit.     Home Meds:   Outpatient Prescriptions Prior to Visit  Medication Sig Dispense Refill  . albuterol (PROVENTIL HFA;VENTOLIN HFA) 108 (90 BASE) MCG/ACT inhaler Inhale 2 puffs into the lungs every 4 (four) hours as needed for wheezing or shortness of breath.  1 Inhaler  3  . amLODipine (NORVASC) 10 MG tablet TAKE ONE TABLET BY MOUTH ONCE DAILY  90 tablet  3  . Fluticasone-Salmeterol (ADVAIR) 250-50 MCG/DOSE AEPB Inhale 1 puff into the lungs 2 (two) times daily.  60 each  1  . levothyroxine (SYNTHROID, LEVOTHROID) 50 MCG tablet Take 1 tablet (50 mcg total) by mouth daily before breakfast.  90 tablet  3  . lisinopril-hydrochlorothiazide (PRINZIDE,ZESTORETIC) 20-12.5 MG per tablet TAKE TWO TABLETS BY MOUTH ONCE DAILY  180 tablet  3  . morphine (MS CONTIN) 15 MG 12 hr tablet Take 15 mg by mouth 3 (three) times daily. Takes 75mg  BID      . morphine (MS CONTIN) 60 MG 12 hr tablet Take 60 mg by mouth 3 (three) times daily.       . pantoprazole (PROTONIX) 40 MG tablet Take 1 tablet (40 mg total) by mouth daily.  30 tablet  3  . potassium chloride (KLOR-CON M10) 10 MEQ tablet TAKE ONE TABLET BY MOUTH EVERY DAY  90 tablet  3  . temazepam (RESTORIL) 15 MG capsule Take 1 capsule (15 mg total) by mouth at bedtime as needed for sleep.  30 capsule  3  . doxycycline (VIBRA-TABS) 100 MG tablet Take 1 tablet (100 mg total) by mouth 2 (two) times daily.  14 tablet  0  . predniSONE (DELTASONE) 10 MG tablet Take 40mg  x 3 days,20mg  x 3 days, 10mg  x 3 days  21  tablet  0  . traZODone (DESYREL) 100 MG tablet        No facility-administered medications prior to visit.    Allergies: No Known Allergies    Review of Systems: See HPI for pertinent ROS. All other ROS negative.    Physical Exam: Blood pressure 110/74, pulse 68, temperature 98.2 F (36.8 C), temperature source Oral, resp. rate 18, weight 192 lb (87.091 kg)., Body mass index is 30.06 kg/(m^2). General: WNWD WM. Appears in no acute distress. Lungs: Clear bilaterally to auscultation without wheezes, rales, or rhonchi. Breathing is unlabored. Heart: Regular rhythm. No murmurs, rubs, or gallops. Msk:  Strength and tone normal for age. Skin: Bilateral Groins, in the creases: Diffuse erythema. Moist.  Neuro: Alert and oriented X 3. Moves all extremities spontaneously. Gait is normal. CNII-XII grossly in tact. Psych:  Responds to questions appropriately with a normal affect.     ASSESSMENT AND PLAN:  68 y.o. year old male with  1. Candida rash of groin Duiscussed with him that this is caused by moisture in skin folds. Discussed that in order to prevent reoccurrence he needs to make sure that these areas are dried thoroughly when he gets out of the  shower and also anytime he gets sweaty these areas need to be dried and he needs to change into dry clothing. - nystatin (MYCOSTATIN) powder; Apply topically 4 (four) times daily.  Dispense: 30 g; Refill: 0   Signed, [address] [place], [place], [facility] 09/06/2013 12:17 PM

## 2013-09-14 ENCOUNTER — Other Ambulatory Visit: Payer: Self-pay | Admitting: Family Medicine

## 2013-09-14 NOTE — Telephone Encounter (Signed)
Ok to refill??  Last office visit 09/06/2013.  Last refill 05/12/2013, 2 refills

## 2013-09-15 NOTE — Telephone Encounter (Signed)
Medication called to pharmacy. 

## 2013-09-15 NOTE — Telephone Encounter (Signed)
Okay 

## 2013-09-21 ENCOUNTER — Other Ambulatory Visit: Payer: Self-pay | Admitting: Family Medicine

## 2013-10-11 ENCOUNTER — Other Ambulatory Visit: Payer: Self-pay | Admitting: Family Medicine

## 2013-10-12 NOTE — Telephone Encounter (Signed)
Ok to refill??  Last office visit 09/05/2013.  Last refill 09/15/2013.

## 2013-10-13 NOTE — Telephone Encounter (Signed)
Okay 

## 2013-10-13 NOTE — Telephone Encounter (Signed)
Medication called to pharmacy. 

## 2013-10-31 ENCOUNTER — Other Ambulatory Visit: Payer: Self-pay | Admitting: Family Medicine

## 2013-10-31 NOTE — Telephone Encounter (Signed)
Refill appropriate and filled per protocol. 

## 2013-11-18 ENCOUNTER — Other Ambulatory Visit: Payer: Self-pay | Admitting: Physician Assistant

## 2013-11-18 NOTE — Telephone Encounter (Signed)
Refill appropriate and filled per protocol. 

## 2013-12-09 ENCOUNTER — Other Ambulatory Visit: Payer: Self-pay | Admitting: Physician Assistant

## 2013-12-09 ENCOUNTER — Other Ambulatory Visit: Payer: Self-pay | Admitting: Family Medicine

## 2013-12-09 NOTE — Telephone Encounter (Signed)
Refill appropriate and filled per protocol. 

## 2013-12-11 ENCOUNTER — Other Ambulatory Visit: Payer: Self-pay | Admitting: Physician Assistant

## 2013-12-11 NOTE — Telephone Encounter (Signed)
Medication refilled per protocol. 

## 2013-12-13 ENCOUNTER — Other Ambulatory Visit: Payer: Self-pay | Admitting: Physician Assistant

## 2013-12-13 ENCOUNTER — Encounter: Payer: Self-pay | Admitting: Family Medicine

## 2013-12-13 NOTE — Telephone Encounter (Signed)
Medication refill for one time only.  Patient needs to be seen.  Letter sent for patient to call and schedule 

## 2013-12-18 ENCOUNTER — Other Ambulatory Visit: Payer: Self-pay | Admitting: Family Medicine

## 2013-12-18 NOTE — Telephone Encounter (Signed)
Medication called to pharmacy. 

## 2013-12-18 NOTE — Telephone Encounter (Signed)
Ok to refill??  Last office visit 09/06/2013.  Last refill 10/13/2013, #1 refill.

## 2013-12-18 NOTE — Telephone Encounter (Signed)
ok 

## 2013-12-18 NOTE — Telephone Encounter (Signed)
MD please advise

## 2013-12-25 ENCOUNTER — Other Ambulatory Visit: Payer: Self-pay | Admitting: Family Medicine

## 2013-12-25 ENCOUNTER — Ambulatory Visit (INDEPENDENT_AMBULATORY_CARE_PROVIDER_SITE_OTHER): Payer: BC Managed Care – PPO | Admitting: Physician Assistant

## 2013-12-25 ENCOUNTER — Encounter: Payer: Self-pay | Admitting: Physician Assistant

## 2013-12-25 VITALS — BP 104/78 | HR 72 | Temp 97.9°F | Resp 18 | Ht 67.0 in | Wt 196.0 lb

## 2013-12-25 DIAGNOSIS — G8929 Other chronic pain: Secondary | ICD-10-CM

## 2013-12-25 DIAGNOSIS — G4700 Insomnia, unspecified: Secondary | ICD-10-CM

## 2013-12-25 DIAGNOSIS — M545 Low back pain: Secondary | ICD-10-CM

## 2013-12-25 DIAGNOSIS — E039 Hypothyroidism, unspecified: Secondary | ICD-10-CM

## 2013-12-25 DIAGNOSIS — J439 Emphysema, unspecified: Secondary | ICD-10-CM

## 2013-12-25 DIAGNOSIS — K219 Gastro-esophageal reflux disease without esophagitis: Secondary | ICD-10-CM

## 2013-12-25 DIAGNOSIS — B1920 Unspecified viral hepatitis C without hepatic coma: Secondary | ICD-10-CM

## 2013-12-25 DIAGNOSIS — I10 Essential (primary) hypertension: Secondary | ICD-10-CM

## 2013-12-25 DIAGNOSIS — Z23 Encounter for immunization: Secondary | ICD-10-CM

## 2013-12-25 DIAGNOSIS — Z0000 Encounter for general adult medical examination without abnormal findings: Secondary | ICD-10-CM

## 2013-12-25 MED ORDER — POTASSIUM CHLORIDE CRYS ER 10 MEQ PO TBCR: 10.0000 meq | EXTENDED_RELEASE_TABLET | Freq: Every day | ORAL | Status: DC

## 2013-12-25 MED ORDER — LISINOPRIL-HYDROCHLOROTHIAZIDE 20-12.5 MG PO TABS: ORAL_TABLET | ORAL | Status: DC

## 2013-12-25 MED ORDER — AMLODIPINE BESYLATE 10 MG PO TABS: 10.0000 mg | ORAL_TABLET | Freq: Every day | ORAL | Status: DC

## 2013-12-25 MED ORDER — LEVOTHYROXINE SODIUM 50 MCG PO TABS: 50.0000 ug | ORAL_TABLET | Freq: Every day | ORAL | Status: DC

## 2013-12-25 NOTE — Progress Notes (Signed)
Subjective:   Patient presents for Medicare Annual/Subsequent preventive examination.   Review Past Medical/Family/Social: These are all reviewed today are all are updated in Epic today.   Risk Factors  Current exercise habits: He is unable to exercise secondary to chronic low back pain which she sees the pain clinic for. Dietary issues discussed: He eats a fairly healthy diet. Discussed low-fat diet.  Cardiac risk factors:  Male, Age, HTN, Past Smoker  Depression Screen  (Note: if answer to either of the following is "Yes", a more complete depression screening is indicated)  Over the past two weeks, have you felt down, depressed or hopeless? No Over the past two weeks, have you felt little interest or pleasure in doing things? No Have you lost interest or pleasure in daily life? No Do you often feel hopeless? No Do you cry easily over simple problems? No   Activities of Daily Living  In your present state of health, do you have any difficulty performing the following activities?:  Driving? No  Managing money? No  Feeding yourself? No  Getting from bed to chair? No  Climbing a flight of stairs? No  Preparing food and eating?: No  Bathing or showering? No  Getting dressed: No  Getting to the toilet? No  Using the toilet:No  Moving around from place to place: No  In the past year have you fallen or had a near fall?:No  Are you sexually active? No  Do you have more than one partner? No   Hearing Difficulties: No  Do you often ask people to speak up or repeat themselves? No  Do you experience ringing or noises in your ears? No Do you have difficulty understanding soft or whispered voices? No  Do you feel that you have a problem with memory? No Do you often misplace items? No  Do you feel safe at home? Yes  Cognitive Testing  Alert? Yes Normal Appearance?Yes  Oriented to person? Yes Place? Yes  Time? Yes  Recall of three objects? Yes  Can perform simple calculations? Yes   Displays appropriate judgment?Yes  Can read the correct time from a watch face?Yes   List the Names of Other Physician/Practitioners you currently use:  He goes to the pain clinic. No other medical providers except here and Pain clinic  Indicate any recent Medical Services you may have received from other than [facility] providers in the past year (date may be approximate).   Screening Tests / Date Colonoscopy-- 09/22/2010. Repeat 10 years.                     Zostavax  --discussed today. He is to call his insurance to find out cost and if he is agreeable to pay this price then he will call us and we will send an order to his pharmacy to actually receive immunization there  Mammogram --N/A Influenza Vaccine --plans to get this at [facility] as he has a discount there Tetanus/tdap--- given here 09/14/2011     Assessment:    Annual wellness medicare exam   Plan:    During the course of the visit the patient was educated and counseled about appropriate screening and preventive services including:  Screening mammography ---N/A Colorectal cancer screening --- he had colonoscopy 09/22/2010. To wait 10 years to repeat. Shingles vaccine. Prescription given to that she can get the vaccine at the pharmacy or Medicare part D. today I told him to call his insurance and find out cost of shingles  vaccine. He is agreeable to pay this price and he is to call us and we will send order to his pharmacy where he will actually receive the vaccine here. Screen + for depression. PHQ- 9 score of 12 (moderate depression). We discussed the options of counseling versus possibly a medication. I encouraged her strongly think about the counseling. She is going through some medical problems currently and her husband is as well Mrs. been very stressful for her. She says she will think about it. She does have Xanax to use as needed. Though she may benefit from an SSRI for her more depressive type symptoms but she wants to hold  off at this time.  I aksed her to please have her cardioloist send records since we have none on file.  Diet review for nutrition referral? Yes ____ Not Indicated __x__  Patient Instructions (the written plan) was given to the patient.  Medicare Attestation  I have personally reviewed:  The patient's medical and social history  Their use of alcohol, tobacco or illicit drugs  Their current medications and supplements  The patient's functional ability including ADLs,fall risks, home safety risks, cognitive, and hearing and visual impairment  Diet and physical activities  Evidence for depression or mood disorders  The patient's weight, height, BMI, and visual acuity have been recorded in the chart. I have made referrals, counseling, and provided education to the patient based on review of the above and I have provided the patient with a written personalized care plan for preventive services.     Chief Complaint: Physical (CPE)  HPI: 68 y.o. y/o male here for CPE.    Review of Systems: Consitutional: No fever, chills, fatigue, night sweats, lymphadenopathy, or weight changes. Eyes: No visual changes, eye redness, or discharge. ENT/Mouth: Ears: No otalgia, tinnitus, hearing loss, discharge. Nose: No congestion, rhinorrhea, sinus pain, or epistaxis. Throat: No sore throat, post nasal drip, or teeth pain. Cardiovascular: No CP, palpitations, diaphoresis, DOE, edema, orthopnea, PND. Respiratory: No cough, hemoptysis, SOB, or wheezing. Gastrointestinal: No anorexia, dysphagia, reflux, pain, nausea, vomiting, hematemesis, diarrhea, constipation, BRBPR, or melena. Genitourinary: No dysuria, frequency, urgency, hematuria, incontinence, nocturia, decreased urinary stream, discharge, impotence, or testicular pain/masses. Musculoskeletal:  He has chronic back pain.  Skin: No rash, erythema, lesion changes, pain, warmth, jaundice, or pruritis. Neurological: No headache, dizziness, syncope, seizures,  tremors, memory loss, coordination problems, or paresthesias. Psychological: No anxiety, depression, hallucinations, SI/HI. Endocrine: No fatigue, polydipsia, polyphagia, polyuria, or known diabetes. All other systems were reviewed and are otherwise negative.  Past Medical History  Diagnosis Date  . Hypertension   . GERD (gastroesophageal reflux disease)   . Depression   . Hep C w/ coma, chronic   . Thyroid disease     hypothyroid  . Colon polyps   . Diverticulosis   . Insomnia   . Chronic low back pain      Past Surgical History  Procedure Laterality Date  . Joint replacement Right 03/23/2008    Shoulder- Replacement    Home Meds:  Outpatient Prescriptions Prior to Visit  Medication Sig Dispense Refill  . ADVAIR DISKUS 250-50 MCG/DOSE AEPB INHALE 1 PUFF INTO THE LUNGS 2 (TWO) TIMES DAILY.  60 each  1  . albuterol (PROVENTIL HFA;VENTOLIN HFA) 108 (90 BASE) MCG/ACT inhaler Inhale 2 puffs into the lungs every 4 (four) hours as needed for wheezing or shortness of breath.  1 Inhaler  3  . amLODipine (NORVASC) 10 MG tablet TAKE ONE TABLET BY MOUTH ONCE DAILY  90 tablet  0  . KLOR-CON M10 10 MEQ tablet TAKE ONE TABLET BY MOUTH ONCE DAILY  30 tablet  0  . levothyroxine (SYNTHROID, LEVOTHROID) 50 MCG tablet TAKE ONE TABLET BY MOUTH ONCE DAILY BEFORE BREAKFAST  90 tablet  0  . lisinopril-hydrochlorothiazide (PRINZIDE,ZESTORETIC) 20-12.5 MG per tablet TAKE TWO TABLETS BY MOUTH ONCE DAILY  180 tablet  3  . morphine (MS CONTIN) 15 MG 12 hr tablet Take 15 mg by mouth 3 (three) times daily. Takes 75mg  BID      . morphine (MS CONTIN) 60 MG 12 hr tablet Take 60 mg by mouth 3 (three) times daily.       . pantoprazole (PROTONIX) 40 MG tablet TAKE 1 TABLET BY MOUTH EVERY DAY  30 tablet  3  . temazepam (RESTORIL) 15 MG capsule TAKE ONE CAPSULE BY MOUTH EVERY DAY AT BEDTIME AS NEEDED FOR SLEEP FOR 30 DAYS  30 capsule  1  . nystatin (MYCOSTATIN) powder Apply topically 4 (four) times daily.  30 g  0    No facility-administered medications prior to visit.    Allergies: No Known Allergies  History   Social History  . Marital Status: Divorced    Spouse Name: N/A    Number of Children: N/A  . Years of Education: N/A   Occupational History  . Not on file.   Social History Main Topics  . Smoking status: Former Smoker -- 1.00 packs/day for 50 years    Types: Cigarettes    Quit date: 06/10/2011  . Smokeless tobacco: Never Used  . Alcohol Use: Yes  . Drug Use: No  . Sexual Activity: Not on file   Other Topics Concern  . Not on file   Social History Narrative   Entered 12/2013:   Lives with his Sister, Niece, and Mother   They take turns caring for mother, who is 82   Quit smoking in 2013    Family History  Problem Relation Age of Onset  . Arthritis Father     rheumatoid  . Hypertension Sister   . Arthritis Sister     Physical Exam: Blood pressure 104/78, pulse 72, temperature 97.9 F (36.6 C), temperature source Oral, resp. rate 18, height 5\' 7"  (1.702 m), weight 196 lb (88.905 kg).  General: Well developed, well nourished, WM. Appears in no acute distress. HEENT: Normocephalic, atraumatic. Conjunctiva pink, sclera non-icteric. Pupils 2 mm constricting to 1 mm, round, regular, and equally reactive to light and accomodation. EOMI. Internal auditory canal clear. TMs with good cone of light and without pathology. Nasal mucosa pink. Nares are without discharge. No sinus tenderness. Oral mucosa pink. Dentition good. Pharynx without exudate.   Neck: Supple. Trachea midline. No thyromegaly. Full ROM. No lymphadenopathy. No carotid bruits.  Lungs: Clear to auscultation bilaterally without wheezes, rales, or rhonchi. Breathing is of normal effort and unlabored. Cardiovascular: RRR with S1 S2. No murmurs, rubs, or gallops. Distal pulses 2+ symmetrically. No carotid or abdominal bruits. Abdomen: Soft, non-tender, non-distended with normoactive bowel sounds. No hepatosplenomegaly  or masses. No rebound/guarding. No CVA tenderness. No hernias. Rectal: Deferred by pt. Checking PSA. Musculoskeletal: Full range of motion of legs and arms. Decreased ROM of back.  Skin: Warm and moist without erythema, ecchymosis, wounds, or rash. Neuro: A+Ox3. CN II-XII grossly intact. Moves all extremities spontaneously. Full sensation throughout. Normal gait.  Finger to nose intact. Psych:  Responds to questions appropriately with a normal affect.   Assessment/Plan:  67 y.o. y/o  male here  for CPE -1. Visit for preventive health examination  A. Screening Labs: He is not currently fasting but says that he can easily return tomorrow morning fasting and will do so. - CBC with Differential; Future - COMPLETE METABOLIC PANEL WITH GFR; Future - Lipid panel; Future - TSH; Future - PSA, Medicare; Future  B. Screening For Prostate Cancer: DRE deferred. Check PSA--as above.   C. Screening For Colorectal Cancer: He had colonoscopy 09/22/2010. Was told to repeat 10 years.  D. Immunizations: Flu--he does plan to get seasonal influenza vaccine. However plans to receive this at [facility] secondary to cost. Tetanus----Tdap was given here 09/14/2011 Pneumococcal--- he has received Pneumovax 23 in the past on 09/14/2011.  Will give Prevnar 13 today. He is agreeable to receive this today. Zostavax--- have written this on his AVS today--- he is to call his insurance and find out the coverage of this and what his cost out of pocket would be for this. If he isn't agreeable to pay the price that he will call us and we will send an order to his pharmacy and he will go to the pharmacy to receive the vaccine. Explained and discussed all of this with the patient today and he voices understanding of this process.  Need for prophylactic vaccination against Streptococcus pneumoniae (pneumococcus) - Pneumococcal conjugate vaccine 13-valent    2. Hypothyroidism, unspecified hypothyroidism type TSH  3.  HYPERTENSION, BENIGN ESSENTIAL Blood Pressure at goal. We'll check BMPT when he returns for labs tomorrow. Continue current medications.  4. Pulmonary emphysema, unspecified emphysema type Stable/controlled  5. Hepatitis C virus infection, unspecified chronicity Has been evaluated with the hepatitis clinic in the past.  6. Gastroesophageal reflux disease, esophagitis presence not specified Controlled with current medication  7. Insomnia Controlled with current medication  8. Chronic low back pain Managed by the pain clinic  Routine followup office visit in 6 months or sooner if needed.  Signed:   [address] [place], [place]  12/25/2013 11:29 AM

## 2013-12-26 ENCOUNTER — Other Ambulatory Visit: Payer: BC Managed Care – PPO

## 2013-12-26 DIAGNOSIS — Z0000 Encounter for general adult medical examination without abnormal findings: Secondary | ICD-10-CM

## 2013-12-26 LAB — COMPLETE METABOLIC PANEL WITH GFR
ALBUMIN: 4.1 g/dL (ref 3.5–5.2)
ALT: 57 U/L — ABNORMAL HIGH (ref 0–53)
AST: 57 U/L — AB (ref 0–37)
Alkaline Phosphatase: 90 U/L (ref 39–117)
BUN: 16 mg/dL (ref 6–23)
CALCIUM: 9.2 mg/dL (ref 8.4–10.5)
CO2: 29 mEq/L (ref 19–32)
Chloride: 100 mEq/L (ref 96–112)
Creat: 0.95 mg/dL (ref 0.50–1.35)
GFR, Est African American: >89 mL/min
GFR, Est Non African American: 82 mL/min
Glucose, Bld: 85 mg/dL (ref 70–99)
Potassium: 4 mEq/L (ref 3.5–5.3)
Sodium: 139 mEq/L (ref 135–145)
Total Bilirubin: 1.1 mg/dL (ref 0.2–1.2)
Total Protein: 6.8 g/dL (ref 6.0–8.3)

## 2013-12-26 LAB — CBC WITH DIFFERENTIAL/PLATELET
Basophils Absolute: 0 10*3/uL (ref 0.0–0.1)
Basophils Relative: 0 % (ref 0–1)
Eosinophils Absolute: 0.3 10*3/uL (ref 0.0–0.7)
Eosinophils Relative: 6 % — ABNORMAL HIGH (ref 0–5)
HEMATOCRIT: 42.9 % (ref 39.0–52.0)
Hemoglobin: 14.9 g/dL (ref 13.0–17.0)
LYMPHS PCT: 29 % (ref 12–46)
Lymphs Abs: 1.4 10*3/uL (ref 0.7–4.0)
MCH: 30.7 pg (ref 26.0–34.0)
MCHC: 34.7 g/dL (ref 30.0–36.0)
MCV: 88.3 fL (ref 78.0–100.0)
MONO ABS: 0.7 10*3/uL (ref 0.1–1.0)
Monocytes Relative: 14 % — ABNORMAL HIGH (ref 3–12)
NEUTROS ABS: 2.5 10*3/uL (ref 1.7–7.7)
Neutrophils Relative %: 51 % (ref 43–77)
PLATELETS: 175 10*3/uL (ref 150–400)
RBC: 4.86 MIL/uL (ref 4.22–5.81)
RDW: 14 % (ref 11.5–15.5)
WBC: 4.9 10*3/uL (ref 4.0–10.5)

## 2013-12-26 LAB — LIPID PANEL
Cholesterol: 127 mg/dL (ref 0–200)
HDL: 47 mg/dL (ref >39)
LDL Cholesterol: 52 mg/dL (ref 0–99)
Total CHOL/HDL Ratio: 2.7 Ratio
Triglycerides: 138 mg/dL (ref <150)
VLDL: 28 mg/dL (ref 0–40)

## 2013-12-26 LAB — TSH: TSH: 8.709 u[IU]/mL — ABNORMAL HIGH (ref 0.350–4.500)

## 2013-12-27 ENCOUNTER — Telehealth: Payer: Self-pay | Admitting: Family Medicine

## 2013-12-27 DIAGNOSIS — E039 Hypothyroidism, unspecified: Secondary | ICD-10-CM

## 2013-12-27 LAB — PSA, MEDICARE: PSA: 0.2 ng/mL (ref <=4.00)

## 2013-12-27 MED ORDER — LEVOTHYROXINE SODIUM 75 MCG PO TABS: 75.0000 ug | ORAL_TABLET | Freq: Every day | ORAL | Status: DC

## 2013-12-27 NOTE — Telephone Encounter (Signed)
Pt aware of lab results and change to Thyroid medication.  Understands also need to repeat TSH in 6 weeks

## 2013-12-27 NOTE — Telephone Encounter (Signed)
Message copied by Donne Anon on Wed Dec 27, 2013  4:16 PM ------      Message from: Allayne Butcher      Created: Wed Dec 27, 2013  7:49 AM       Tell patient that the only lab that is a little bit abnormal is his Thyroid. He is on thyroid medication with current dose 50 mcg. Increase the dose to 75 mcg daily.      Send new prescription for levothyroxine 75 mcg one by mouth daily #30 with 1 refill.      Place future order for TSH--tell patient to recheck a TSH in 6 weeks.      Tell patient that all other labs are good.      (FYI--elevated LFTs are secondary to known history of hepatitis) ------

## 2014-01-22 ENCOUNTER — Telehealth: Payer: Self-pay | Admitting: Family Medicine

## 2014-01-22 DIAGNOSIS — I10 Essential (primary) hypertension: Secondary | ICD-10-CM

## 2014-01-22 MED ORDER — LISINOPRIL-HYDROCHLOROTHIAZIDE 20-12.5 MG PO TABS: ORAL_TABLET | ORAL | Status: DC

## 2014-01-22 NOTE — Telephone Encounter (Signed)
Medication refilled per protocol. 

## 2014-01-23 ENCOUNTER — Telehealth: Payer: Self-pay | Admitting: Family Medicine

## 2014-01-23 DIAGNOSIS — I10 Essential (primary) hypertension: Secondary | ICD-10-CM

## 2014-01-23 MED ORDER — LISINOPRIL-HYDROCHLOROTHIAZIDE 20-12.5 MG PO TABS
ORAL_TABLET | ORAL | Status: DC
Start: 2014-01-23 — End: 2014-01-29

## 2014-01-23 NOTE — Telephone Encounter (Signed)
Medication refilled per protocol. 

## 2014-01-29 ENCOUNTER — Other Ambulatory Visit: Payer: Self-pay | Admitting: Family Medicine

## 2014-01-29 DIAGNOSIS — I10 Essential (primary) hypertension: Secondary | ICD-10-CM

## 2014-01-29 MED ORDER — LISINOPRIL-HYDROCHLOROTHIAZIDE 20-12.5 MG PO TABS: ORAL_TABLET | ORAL | Status: DC

## 2014-02-12 ENCOUNTER — Other Ambulatory Visit: Payer: Self-pay | Admitting: Family Medicine

## 2014-02-12 NOTE — Telephone Encounter (Signed)
Ok to refill??  Last office visit 12/25/2013.  Last refill 12/18/2013, #1 refills.

## 2014-02-12 NOTE — Telephone Encounter (Signed)
Okay to refill? 

## 2014-02-13 NOTE — Telephone Encounter (Signed)
Medication called to pharmacy. 

## 2014-02-19 ENCOUNTER — Telehealth: Payer: Self-pay | Admitting: Family Medicine

## 2014-02-19 NOTE — Telephone Encounter (Signed)
Pt was due for 6 week repeat TSH mid November.  Called patient to come have done before refill Thyroid medication.

## 2014-02-20 ENCOUNTER — Other Ambulatory Visit: Payer: BC Managed Care – PPO

## 2014-02-20 DIAGNOSIS — E039 Hypothyroidism, unspecified: Secondary | ICD-10-CM

## 2014-02-20 DIAGNOSIS — Z79899 Other long term (current) drug therapy: Secondary | ICD-10-CM

## 2014-02-20 LAB — TSH: TSH: 2.159 u[IU]/mL (ref 0.350–4.500)

## 2014-02-20 MED ORDER — PANTOPRAZOLE SODIUM 40 MG PO TBEC: 40.0000 mg | DELAYED_RELEASE_TABLET | Freq: Every day | ORAL | Status: DC

## 2014-02-20 NOTE — Telephone Encounter (Signed)
Pt did return my call and I told him we really need to recheck TSH before renewing Thyroid med Jakel term.  He acknowledged understanding.  Pantoprazole refilled per protocol

## 2014-02-21 MED ORDER — LEVOTHYROXINE SODIUM 75 MCG PO TABS: 75.0000 ug | ORAL_TABLET | Freq: Every day | ORAL | Status: DC

## 2014-02-21 NOTE — Telephone Encounter (Signed)
Pt did come have lab done and was called with results.  Refill to pharmacy

## 2014-02-21 NOTE — Telephone Encounter (Signed)
-----   Message from Dorena Bodo, PA-C sent at 02/21/2014  7:35 AM EST ----- TSH normal. Continue current dose of thyroid medication. Recheck TSH 6 months. Send refill of medication to last 6 months.

## 2014-02-26 ENCOUNTER — Telehealth: Payer: Self-pay | Admitting: Family Medicine

## 2014-02-26 DIAGNOSIS — I10 Essential (primary) hypertension: Secondary | ICD-10-CM

## 2014-02-26 MED ORDER — AMLODIPINE BESYLATE 10 MG PO TABS: 10.0000 mg | ORAL_TABLET | Freq: Every day | ORAL | Status: DC

## 2014-02-26 NOTE — Telephone Encounter (Signed)
Medication refilled per protocol. 

## 2014-03-07 ENCOUNTER — Other Ambulatory Visit: Payer: Self-pay | Admitting: Family Medicine

## 2014-03-07 NOTE — Telephone Encounter (Signed)
Refill appropriate and filled per protocol. 

## 2014-04-02 ENCOUNTER — Telehealth: Payer: Self-pay | Admitting: Family Medicine

## 2014-04-02 DIAGNOSIS — I10 Essential (primary) hypertension: Secondary | ICD-10-CM

## 2014-04-02 MED ORDER — POTASSIUM CHLORIDE CRYS ER 10 MEQ PO TBCR: 10.0000 meq | EXTENDED_RELEASE_TABLET | Freq: Every day | ORAL | Status: DC

## 2014-04-02 NOTE — Telephone Encounter (Signed)
Medication refilled per protocol. 

## 2014-04-11 ENCOUNTER — Other Ambulatory Visit: Payer: Self-pay | Admitting: Family Medicine

## 2014-04-11 NOTE — Telephone Encounter (Signed)
Approved. #30+2. 

## 2014-04-11 NOTE — Telephone Encounter (Signed)
?   OK to Refill - Last OV - 12/25/13 CPE with MBD and last refill 03/15/14

## 2014-04-11 NOTE — Telephone Encounter (Signed)
LRF 11/24 #30 + 1.  LOV 12/25/13  OK refill?

## 2014-04-12 NOTE — Telephone Encounter (Signed)
Medication called to pharmacy. 

## 2014-05-15 ENCOUNTER — Telehealth: Payer: Self-pay | Admitting: Family Medicine

## 2014-05-15 NOTE — Telephone Encounter (Signed)
Can Order # 90 + 0.

## 2014-05-15 NOTE — Telephone Encounter (Signed)
Mail order pharmacy asking for 90 day supply of Temazepam.  LRF 04/12/14  #30 + 2 to local pharmacy.  Due to cost pt would like form mail order  OK refill?

## 2014-05-16 MED ORDER — TEMAZEPAM 15 MG PO CAPS: 15.0000 mg | ORAL_CAPSULE | Freq: Every evening | ORAL | Status: DC | PRN

## 2014-05-16 NOTE — Telephone Encounter (Signed)
Rx printed and faxed to pharmacy. 

## 2014-06-23 ENCOUNTER — Other Ambulatory Visit: Payer: Self-pay | Admitting: Physician Assistant

## 2014-06-25 NOTE — Telephone Encounter (Signed)
Medication filled x1 with no refills.   Requires office visit before any further refills can be given.   Letter sent.  

## 2014-07-16 ENCOUNTER — Other Ambulatory Visit: Payer: Self-pay | Admitting: Physician Assistant

## 2014-07-16 ENCOUNTER — Encounter: Payer: Self-pay | Admitting: Family Medicine

## 2014-07-16 NOTE — Telephone Encounter (Signed)
Medication refill for one time only.  Patient needs to be seen.  Letter sent for patient to call and schedule 

## 2014-07-25 ENCOUNTER — Ambulatory Visit (INDEPENDENT_AMBULATORY_CARE_PROVIDER_SITE_OTHER): Payer: BC Managed Care – PPO | Admitting: Family Medicine

## 2014-07-25 ENCOUNTER — Encounter: Payer: Self-pay | Admitting: Family Medicine

## 2014-07-25 VITALS — BP 128/70 | HR 76 | Temp 98.2°F | Resp 14 | Ht 67.0 in | Wt 184.0 lb

## 2014-07-25 DIAGNOSIS — E038 Other specified hypothyroidism: Secondary | ICD-10-CM | POA: Diagnosis not present

## 2014-07-25 DIAGNOSIS — I10 Essential (primary) hypertension: Secondary | ICD-10-CM | POA: Diagnosis not present

## 2014-07-25 DIAGNOSIS — K219 Gastro-esophageal reflux disease without esophagitis: Secondary | ICD-10-CM

## 2014-07-25 DIAGNOSIS — J439 Emphysema, unspecified: Secondary | ICD-10-CM | POA: Diagnosis not present

## 2014-07-25 DIAGNOSIS — G4700 Insomnia, unspecified: Secondary | ICD-10-CM | POA: Diagnosis not present

## 2014-07-25 LAB — COMPREHENSIVE METABOLIC PANEL
ALT: 48 U/L (ref 0–53)
AST: 45 U/L — ABNORMAL HIGH (ref 0–37)
Albumin: 4 g/dL (ref 3.5–5.2)
Alkaline Phosphatase: 87 U/L (ref 39–117)
BILIRUBIN TOTAL: 1.7 mg/dL — AB (ref 0.2–1.2)
BUN: 17 mg/dL (ref 6–23)
CO2: 25 meq/L (ref 19–32)
Calcium: 9.2 mg/dL (ref 8.4–10.5)
Chloride: 103 mEq/L (ref 96–112)
Creat: 0.86 mg/dL (ref 0.50–1.35)
GLUCOSE: 95 mg/dL (ref 70–99)
Potassium: 3.7 mEq/L (ref 3.5–5.3)
Sodium: 138 mEq/L (ref 135–145)
Total Protein: 6.5 g/dL (ref 6.0–8.3)

## 2014-07-25 LAB — T3, FREE: T3, Free: 3 pg/mL (ref 2.3–4.2)

## 2014-07-25 LAB — CBC WITH DIFFERENTIAL/PLATELET
Basophils Absolute: 0 10*3/uL (ref 0.0–0.1)
Basophils Relative: 0 % (ref 0–1)
EOS ABS: 0.1 10*3/uL (ref 0.0–0.7)
Eosinophils Relative: 2 % (ref 0–5)
HCT: 42 % (ref 39.0–52.0)
Hemoglobin: 14.6 g/dL (ref 13.0–17.0)
LYMPHS ABS: 1.3 10*3/uL (ref 0.7–4.0)
Lymphocytes Relative: 28 % (ref 12–46)
MCH: 30.5 pg (ref 26.0–34.0)
MCHC: 34.8 g/dL (ref 30.0–36.0)
MCV: 87.7 fL (ref 78.0–100.0)
MPV: 9.4 fL (ref 8.6–12.4)
Monocytes Absolute: 0.6 10*3/uL (ref 0.1–1.0)
Monocytes Relative: 14 % — ABNORMAL HIGH (ref 3–12)
NEUTROS ABS: 2.6 10*3/uL (ref 1.7–7.7)
Neutrophils Relative %: 56 % (ref 43–77)
Platelets: 181 10*3/uL (ref 150–400)
RBC: 4.79 MIL/uL (ref 4.22–5.81)
RDW: 14.4 % (ref 11.5–15.5)
WBC: 4.6 10*3/uL (ref 4.0–10.5)

## 2014-07-25 LAB — TSH: TSH: 1.682 u[IU]/mL (ref 0.350–4.500)

## 2014-07-25 MED ORDER — SUVOREXANT 10 MG PO TABS: 1.0000 | ORAL_TABLET | Freq: Every evening | ORAL | Status: DC | PRN

## 2014-07-25 NOTE — Assessment & Plan Note (Signed)
Blood pressure is well controlled medication medication 

## 2014-07-25 NOTE — Progress Notes (Signed)
Patient ID: [name], male   DOB: [birth year], 69 y.o.   MRN: [id number]   Subjective:    Patient ID: Tommy Robbins, male    DOB: Sep 11, 1945, 69 y.o.   MRN: [id number]  Patient presents for 6 month F/U  patient here to follow-up medications. He has no particular concerns today. He did have an episode of abdominal cramps with a large bowel movement on Sunday which turned into diarrhea but he has not had any episodes since then. He does not remember if he ate something different. He has history of acid reflux he typically takes his medication 3-4 times a week and does well with this. He is still being followed by his pain doctor and they're trying to taper down on his MS Contin.  He has history of chronic insomnia he's been on multiple medications in the past he does not sleep as well with temazepam any further sometimes he has to take a Benadryl appear does not follow sleep.  COPD his breathing has been stable  Review Of Systems:  GEN- denies fatigue, fever, weight loss,weakness, recent illness HEENT- denies eye drainage, change in vision, nasal discharge, CVS- denies chest pain, palpitations RESP- denies SOB, cough, wheeze ABD- denies N/V, change in stools, abd pain GU- denies dysuria, hematuria, dribbling, incontinence MSK- + joint pain, muscle aches, injury Neuro- denies headache, dizziness, syncope, seizure activity       Objective:    BP 128/70 mmHg  Pulse 76  Temp(Src) 98.2 F (36.8 C) (Oral)  Resp 14  Ht 5\' 7"  (1.702 m)  Wt 184 lb (83.462 kg)  BMI 28.81 kg/m2 GEN- NAD, alert and oriented x3 HEENT- PERRL, EOMI, non injected sclera, pink conjunctiva, MMM, oropharynx clear CVS- RRR, no murmur RESP-few scattered wheeze. Otherwise clear ABD-NABS,soft,NT,ND EXT- No edema Pulses- Radial,  2+        Assessment & Plan:      Problem List Items Addressed This Visit    Insomnia   HYPERTENSION, BENIGN ESSENTIAL - Primary   Relevant Orders   CBC with  Differential/Platelet   Comprehensive metabolic panel   GERD (gastroesophageal reflux disease)   COPD (chronic obstructive pulmonary disease)    Other Visit Diagnoses    Other specified hypothyroidism        Relevant Orders    TSH    T3, free       Note: This dictation was prepared with Dragon dictation along with smaller phrase technology. Any transcriptional errors that result from this process are unintentional.

## 2014-07-25 NOTE — Assessment & Plan Note (Signed)
Sleep continues to be a problem for him. I'm going to try him on the new Belsomra at 10 mg he was given a voucher for this

## 2014-07-25 NOTE — Assessment & Plan Note (Signed)
Recheck thyroid function testing continue with thyroid replacement

## 2014-07-25 NOTE — Patient Instructions (Signed)
We will call with lab results Try the Belsomra-sleep prescription- take 1 hour before bedtime F/U in Mid- October- for physical

## 2014-07-25 NOTE — Assessment & Plan Note (Signed)
His breathing is currently stable. He has not had any problems with the pollen. He will continue Advair and albuterol as his rescue inhaler

## 2014-08-02 ENCOUNTER — Telehealth: Payer: Self-pay | Admitting: Family Medicine

## 2014-08-02 NOTE — Telephone Encounter (Signed)
[phone] [facility] [address]  Pt is needing a refill on chantix (i didn't see it on his med list)

## 2014-08-02 NOTE — Telephone Encounter (Signed)
Ok to refill 

## 2014-08-03 MED ORDER — VARENICLINE TARTRATE 0.5 MG X 11 & 1 MG X 42 PO MISC: ORAL | Status: DC

## 2014-08-03 NOTE — Telephone Encounter (Signed)
Call placed to patient.   Reports that he has taken Chantix in the past with great results, but has recently started smoking again.   Reports that he would like to stop and requested starter pack.   Prescription sent to pharmacy.

## 2014-08-03 NOTE — Telephone Encounter (Signed)
Okay for pt to take, just clarify if he needs starting pack or not,

## 2014-08-06 ENCOUNTER — Telehealth: Payer: Self-pay | Admitting: Family Medicine

## 2014-08-06 MED ORDER — LISINOPRIL-HYDROCHLOROTHIAZIDE 20-12.5 MG PO TABS: 2.0000 | ORAL_TABLET | Freq: Every day | ORAL | Status: DC

## 2014-08-06 NOTE — Telephone Encounter (Signed)
Medication refilled per protocol. 

## 2014-08-13 ENCOUNTER — Telehealth: Payer: Self-pay | Admitting: Family Medicine

## 2014-08-13 ENCOUNTER — Other Ambulatory Visit: Payer: Self-pay | Admitting: Physician Assistant

## 2014-08-13 MED ORDER — TEMAZEPAM 15 MG PO CAPS: 15.0000 mg | ORAL_CAPSULE | Freq: Every evening | ORAL | Status: DC | PRN

## 2014-08-13 NOTE — Telephone Encounter (Signed)
Medication refilled per protocol. 

## 2014-08-13 NOTE — Telephone Encounter (Signed)
Refill faxed to mail order  

## 2014-08-13 NOTE — Telephone Encounter (Signed)
Mail order requesting 90 day refill of Temazepam 15 mg  LRF 05/16/14 #90.  LOV 07/25/14  OK refill?

## 2014-08-13 NOTE — Telephone Encounter (Signed)
Okay to refill? 

## 2014-08-26 ENCOUNTER — Other Ambulatory Visit: Payer: Self-pay | Admitting: Family Medicine

## 2014-08-28 MED ORDER — VARENICLINE TARTRATE 1 MG PO TABS: 1.0000 mg | ORAL_TABLET | Freq: Two times a day (BID) | ORAL | Status: DC

## 2014-08-28 NOTE — Telephone Encounter (Signed)
Refill for starter month denied.   Continuing pack sent.

## 2014-09-22 ENCOUNTER — Other Ambulatory Visit: Payer: Self-pay | Admitting: Family Medicine

## 2014-09-25 NOTE — Telephone Encounter (Signed)
Medication refilled per protocol. 

## 2014-11-06 ENCOUNTER — Other Ambulatory Visit: Payer: Self-pay | Admitting: Family Medicine

## 2014-11-06 NOTE — Telephone Encounter (Signed)
See if this is needed, chantix is only for 12 weeks total

## 2014-11-06 NOTE — Telephone Encounter (Signed)
Per pharmacy, patient has had starting pack and (2) continuing packs.   Refill denied.   Requires office visit before any further refills can be given.

## 2014-11-06 NOTE — Telephone Encounter (Signed)
Ok to refill 

## 2014-11-09 ENCOUNTER — Other Ambulatory Visit: Payer: Self-pay | Admitting: *Deleted

## 2014-11-09 DIAGNOSIS — G479 Sleep disorder, unspecified: Secondary | ICD-10-CM

## 2014-11-09 MED ORDER — TEMAZEPAM 15 MG PO CAPS: 15.0000 mg | ORAL_CAPSULE | Freq: Every evening | ORAL | Status: DC | PRN

## 2014-11-22 ENCOUNTER — Telehealth: Payer: Self-pay | Admitting: *Deleted

## 2014-11-22 NOTE — Telephone Encounter (Signed)
Pt called today stating that he had called his pharmacy [facility] and they stated they never received the prescription, I looked up the medication he requested and informed him that this prescripton Temazepam 15mg  was called in to his pharmacy on 11/09/14, I told the pt that I will contact his pharmacy and check on the status of the medication. I called and they have not received the prescription so I gave the authorization to fill his prescription.

## 2014-12-15 ENCOUNTER — Other Ambulatory Visit: Payer: Self-pay | Admitting: Physician Assistant

## 2014-12-17 ENCOUNTER — Encounter: Payer: Self-pay | Admitting: Family Medicine

## 2014-12-17 NOTE — Telephone Encounter (Signed)
Medication refilled per protocol. 

## 2014-12-31 ENCOUNTER — Ambulatory Visit (INDEPENDENT_AMBULATORY_CARE_PROVIDER_SITE_OTHER): Payer: Medicare Other | Admitting: Physician Assistant

## 2014-12-31 ENCOUNTER — Encounter: Payer: Self-pay | Admitting: Physician Assistant

## 2014-12-31 VITALS — BP 110/64 | HR 68 | Temp 98.0°F | Resp 18 | Ht 68.0 in | Wt 182.0 lb

## 2014-12-31 DIAGNOSIS — J439 Emphysema, unspecified: Secondary | ICD-10-CM

## 2014-12-31 DIAGNOSIS — E039 Hypothyroidism, unspecified: Secondary | ICD-10-CM | POA: Diagnosis not present

## 2014-12-31 DIAGNOSIS — M545 Low back pain: Secondary | ICD-10-CM | POA: Diagnosis not present

## 2014-12-31 DIAGNOSIS — B1920 Unspecified viral hepatitis C without hepatic coma: Secondary | ICD-10-CM | POA: Diagnosis not present

## 2014-12-31 DIAGNOSIS — G4700 Insomnia, unspecified: Secondary | ICD-10-CM | POA: Diagnosis not present

## 2014-12-31 DIAGNOSIS — I10 Essential (primary) hypertension: Secondary | ICD-10-CM | POA: Diagnosis not present

## 2014-12-31 DIAGNOSIS — Z0000 Encounter for general adult medical examination without abnormal findings: Secondary | ICD-10-CM

## 2014-12-31 DIAGNOSIS — K219 Gastro-esophageal reflux disease without esophagitis: Secondary | ICD-10-CM

## 2014-12-31 DIAGNOSIS — G8929 Other chronic pain: Secondary | ICD-10-CM

## 2014-12-31 NOTE — Progress Notes (Signed)
Subjective:   Patient presents for Medicare Annual/Subsequent preventive examination.   Review Past Medical/Family/Social: These are all reviewed today are all are updated in Epic today.   Risk Factors  Current exercise habits: He is unable to exercise secondary to chronic low back pain which she sees the pain clinic for. Dietary issues discussed: He eats a fairly healthy diet. Discussed low-fat diet.  Cardiac risk factors:  Male, Age, HTN, Past Smoker  Depression Screen  (Note: if answer to either of the following is "Yes", a more complete depression screening is indicated)  Over the past two weeks, have you felt down, depressed or hopeless? No Over the past two weeks, have you felt little interest or pleasure in doing things? No Have you lost interest or pleasure in daily life? No Do you often feel hopeless? No Do you cry easily over simple problems? No   Activities of Daily Living  In your present state of health, do you have any difficulty performing the following activities?:  Driving? No  Managing money? No  Feeding yourself? No  Getting from bed to chair? No  Climbing a flight of stairs? No  Preparing food and eating?: No  Bathing or showering? No  Getting dressed: No  Getting to the toilet? No  Using the toilet:No  Moving around from place to place: No  In the past year have you fallen or had a near fall?:No  Are you sexually active? No  Do you have more than one partner? No   Hearing Difficulties: No  Do you often ask people to speak up or repeat themselves? No  Do you experience ringing or noises in your ears? No Do you have difficulty understanding soft or whispered voices? No  Do you feel that you have a problem with memory? No Do you often misplace items? No  Do you feel safe at home? Yes  Cognitive Testing  Alert? Yes Normal Appearance?Yes  Oriented to person? Yes Place? Yes  Time? Yes  Recall of three objects? Yes  Can perform simple calculations? Yes   Displays appropriate judgment?Yes  Can read the correct time from a watch face?Yes   List the Names of Other Physician/Practitioners you currently use:  He goes to the pain clinic.Sees Dr. Vear Clock there.  No other medical providers except here and Pain clinic  Indicate any recent Medical Services you may have received from other than [facility] providers in the past year (date may be approximate).   Screening Tests / Date Colonoscopy-- 09/22/2010. Repeat 10 years.                     Zostavax  --discussed today. He is to call his insurance to find out cost and if he is agreeable to pay this price then he will call us and we will send an order to his pharmacy to actually receive immunization there  Mammogram --N/A Influenza Vaccine --He has already received this (12/2014) Tetanus/tdap--- given here 09/14/2011     Assessment:    Annual wellness medicare exam   Plan:    During the course of the visit the patient was educated and counseled about appropriate screening and preventive services including:  Screening mammography ---N/A Colorectal cancer screening --- he had colonoscopy 09/22/2010. To wait 10 years to repeat. Shingles vaccine. Prescription given to that she can get the vaccine at the pharmacy or Medicare part D. today I told him to call his insurance and find out cost of shingles vaccine. He is  agreeable to pay this price and he is to call us and we will send order to his pharmacy where he will actually receive the vaccine here. Screen + for depression. PHQ- 9 score of 12 (moderate depression). We discussed the options of counseling versus possibly a medication. I encouraged her strongly think about the counseling. She is going through some medical problems currently and her husband is as well Mrs. been very stressful for her. She says she will think about it. She does have Xanax to use as needed. Though she may benefit from an SSRI for her more depressive type symptoms but she wants to  hold off at this time.  I aksed her to please have her cardioloist send records since we have none on file.  Diet review for nutrition referral? Yes ____ Not Indicated __x__  Patient Instructions (the written plan) was given to the patient.  Medicare Attestation  I have personally reviewed:  The patient's medical and social history  Their use of alcohol, tobacco or illicit drugs  Their current medications and supplements  The patient's functional ability including ADLs,fall risks, home safety risks, cognitive, and hearing and visual impairment  Diet and physical activities  Evidence for depression or mood disorders  The patient's weight, height, BMI, and visual acuity have been recorded in the chart. I have made referrals, counseling, and provided education to the patient based on review of the above and I have provided the patient with a written personalized care plan for preventive services.     Chief Complaint: Physical (CPE)  HPI: 69 y.o. y/o male here for CPE.   He has no complaints or concerns today.   Review of Systems: Consitutional: No fever, chills, fatigue, night sweats, lymphadenopathy, or weight changes. Eyes: No visual changes, eye redness, or discharge. ENT/Mouth: Ears: No otalgia, tinnitus, hearing loss, discharge. Nose: No congestion, rhinorrhea, sinus pain, or epistaxis. Throat: No sore throat, post nasal drip, or teeth pain. Cardiovascular: No CP, palpitations, diaphoresis, DOE, edema, orthopnea, PND. Respiratory: No cough, hemoptysis, SOB, or wheezing. Gastrointestinal: No anorexia, dysphagia, reflux, pain, nausea, vomiting, hematemesis, diarrhea, constipation, BRBPR, or melena. Genitourinary: No dysuria, frequency, urgency, hematuria, incontinence, nocturia, decreased urinary stream, discharge, impotence, or testicular pain/masses. Musculoskeletal:  He has chronic back pain.  Skin: No rash, erythema, lesion changes, pain, warmth, jaundice, or  pruritis. Neurological: No headache, dizziness, syncope, seizures, tremors, memory loss, coordination problems, or paresthesias. Psychological: No anxiety, depression, hallucinations, SI/HI. Endocrine: No fatigue, polydipsia, polyphagia, polyuria, or known diabetes. All other systems were reviewed and are otherwise negative.  Past Medical History  Diagnosis Date  . Hypertension   . GERD (gastroesophageal reflux disease)   . Depression   . Hep C w/ coma, chronic (HCC)   . Thyroid disease     hypothyroid  . Colon polyps   . Diverticulosis   . Insomnia   . Chronic low back pain      Past Surgical History  Procedure Laterality Date  . Joint replacement Right 03/23/2008    Shoulder- Replacement    Home Meds:  Outpatient Prescriptions Prior to Visit  Medication Sig Dispense Refill  . amLODipine (NORVASC) 10 MG tablet TAKE 1 TABLET DAILY 90 tablet 1  . levothyroxine (SYNTHROID, LEVOTHROID) 75 MCG tablet TAKE 1 TABLET DAILY BEFORE BREAKFAST 90 tablet 1  . lisinopril-hydrochlorothiazide (PRINZIDE,ZESTORETIC) 20-12.5 MG per tablet Take 2 tablets by mouth daily. 180 tablet 1  . morphine (MS CONTIN) 30 MG 12 hr tablet Take 30 mg by mouth daily.    Marland Kitchen  morphine (MS CONTIN) 60 MG 12 hr tablet Take 60 mg by mouth every 12 (twelve) hours.     . Nutritional Supplements (JUICE PLUS FIBRE PO) Take 2 capsules by mouth 2 (two) times daily.    . pantoprazole (PROTONIX) 40 MG tablet TAKE 1 TABLET DAILY 90 tablet 0  . potassium chloride (KLOR-CON M10) 10 MEQ tablet Take 1 tablet (10 mEq total) by mouth daily. 90 tablet 1  . temazepam (RESTORIL) 15 MG capsule Take 1 capsule (15 mg total) by mouth at bedtime as needed for sleep. 90 capsule 0  . ADVAIR DISKUS 250-50 MCG/DOSE AEPB INHALE 1 PUFF INTO THE LUNGS 2 (TWO) TIMES DAILY. (Patient not taking: Reported on 12/31/2014) 60 each 1  . albuterol (PROVENTIL HFA;VENTOLIN HFA) 108 (90 BASE) MCG/ACT inhaler Inhale 2 puffs into the lungs every 4 (four) hours as  needed for wheezing or shortness of breath. (Patient not taking: Reported on 12/31/2014) 1 Inhaler 3  . Suvorexant (BELSOMRA) 10 MG TABS Take 1 tablet by mouth at bedtime as needed. (Patient not taking: Reported on 12/31/2014) 10 tablet 0  . varenicline (CHANTIX CONTINUING MONTH PAK) 1 MG tablet Take 1 tablet (1 mg total) by mouth 2 (two) times daily. (Patient not taking: Reported on 12/31/2014) 60 tablet 1   No facility-administered medications prior to visit.    Allergies: No Known Allergies  Social History   Social History  . Marital Status: Divorced    Spouse Name: N/A  . Number of Children: N/A  . Years of Education: N/A   Occupational History  . Not on file.   Social History Main Topics  . Smoking status: Former Smoker -- 1.00 packs/day for 50 years    Types: Cigarettes    Quit date: 06/10/2011  . Smokeless tobacco: Never Used  . Alcohol Use: Yes  . Drug Use: No  . Sexual Activity: Not on file   Other Topics Concern  . Not on file   Social History Narrative   Entered 12/2013:   Lives with his Sister, Niece, and Mother   They take turns caring for mother, who is 71   Quit smoking in 2013    Family History  Problem Relation Age of Onset  . Arthritis Father     rheumatoid  . Hypertension Sister   . Arthritis Sister     Physical Exam: Blood pressure 110/64, pulse 68, temperature 98 F (36.7 C), temperature source Oral, resp. rate 18, height  (1.727 m), weight 182 lb (82.555 kg).  General: Well developed, well nourished, WM. Appears in no acute distress. HEENT: Normocephalic, atraumatic. Conjunctiva pink, sclera non-icteric. Pupils 2 mm constricting to 1 mm, round, regular, and equally reactive to light and accomodation. EOMI. Internal auditory canal clear. TMs with good cone of light and without pathology. Nasal mucosa pink. Nares are without discharge. No sinus tenderness. Oral mucosa pink. Dentition good. Pharynx without exudate.   Neck: Supple. Trachea  midline. No thyromegaly. Full ROM. No lymphadenopathy. No carotid bruits.  Lungs: Clear to auscultation bilaterally without wheezes, rales, or rhonchi. Breathing is of normal effort and unlabored. Cardiovascular: RRR with S1 S2. No murmurs, rubs, or gallops. Distal pulses 2+ symmetrically. No carotid or abdominal bruits. Abdomen: Soft, non-tender, non-distended with normoactive bowel sounds. No hepatosplenomegaly or masses. No rebound/guarding. No CVA tenderness. No hernias. Rectal: Deferred by pt. Checking PSA. Musculoskeletal: Full range of motion of legs and arms. Decreased ROM of back.  Skin: Warm and moist without erythema, ecchymosis, wounds, or rash.  Neuro: A+Ox3. CN II-XII grossly intact. Moves all extremities spontaneously. Full sensation throughout. Normal gait.  Finger to nose intact. Psych:  Responds to questions appropriately with a normal affect.   Assessment/Plan:  69 y.o. y/o  male here for CPE -1. Visit for preventive health examination  A. Screening Labs: He is not currently fasting but says that he can easily return tomorrow morning fasting and will do so. - CBC with Differential; Future - COMPLETE METABOLIC PANEL WITH GFR; Future - Lipid panel; Future - TSH; Future - PSA, Medicare; Future  B. Screening For Prostate Cancer: DRE deferred. Check PSA--as above.   C. Screening For Colorectal Cancer: He had colonoscopy 09/22/2010. Was told to repeat 10 years.  D. Immunizations: Flu------------12/31/2014-----he has already received for this season Tetanus----Tdap was given here 09/14/2011 Pneumococcal--- he has received Pneumovax 23 in the past on 09/14/2011.  Prevnar 13 ---given here 12/25/2013 Zostavax--- have written this on his AVS today--- he is to call his insurance and find out the coverage of this and what his cost out of pocket would be for this. If he isn't agreeable to pay the price that he will call us and we will send an order to his pharmacy and he will go to the  pharmacy to receive the vaccine. Explained and discussed all of this with the patient today and he voices understanding of this process.   2. Hypothyroidism, unspecified hypothyroidism type TSH  3. HYPERTENSION, BENIGN ESSENTIAL Blood Pressure at goal. We'll check BMPT when he returns for labs tomorrow. Continue current medications.  4. Pulmonary emphysema, unspecified emphysema type Stable/controlled He had quit smoking in the past, then started back. At OV 12/31/2014---says that he quit again about 5 months ago, with Chantix  5. Hepatitis C virus infection, unspecified chronicity Has been evaluated with the hepatitis clinic in the past.  6. Gastroesophageal reflux disease, esophagitis presence not specified Controlled with current medication  7. Insomnia Controlled with current medication  8. Chronic low back pain Managed by the pain clinic  Routine followup office visit in 6 months or sooner if needed.  Signed:   [address] [place], [place]  12/31/2014 11:27 AM

## 2015-01-01 ENCOUNTER — Other Ambulatory Visit: Payer: Medicare Other

## 2015-01-01 DIAGNOSIS — Z0000 Encounter for general adult medical examination without abnormal findings: Secondary | ICD-10-CM

## 2015-01-01 LAB — COMPLETE METABOLIC PANEL WITH GFR
ALT: 56 U/L — ABNORMAL HIGH (ref 9–46)
AST: 51 U/L — ABNORMAL HIGH (ref 10–35)
Albumin: 4.2 g/dL (ref 3.6–5.1)
Alkaline Phosphatase: 96 U/L (ref 40–115)
BILIRUBIN TOTAL: 1 mg/dL (ref 0.2–1.2)
BUN: 16 mg/dL (ref 7–25)
CHLORIDE: 100 mmol/L (ref 98–110)
CO2: 30 mmol/L (ref 20–31)
Calcium: 9.4 mg/dL (ref 8.6–10.3)
Creat: 0.82 mg/dL (ref 0.70–1.25)
Glucose, Bld: 86 mg/dL (ref 70–99)
POTASSIUM: 4 mmol/L (ref 3.5–5.3)
Sodium: 141 mmol/L (ref 135–146)
TOTAL PROTEIN: 6.4 g/dL (ref 6.1–8.1)

## 2015-01-01 LAB — LIPID PANEL
CHOL/HDL RATIO: 2.3 ratio (ref <=5.0)
Cholesterol: 131 mg/dL (ref 125–200)
HDL: 58 mg/dL (ref >=40)
LDL CALC: 53 mg/dL (ref <130)
TRIGLYCERIDES: 101 mg/dL (ref <150)
VLDL: 20 mg/dL (ref <30)

## 2015-01-01 LAB — CBC WITH DIFFERENTIAL/PLATELET
BASOS PCT: 0 % (ref 0–1)
Basophils Absolute: 0 10*3/uL (ref 0.0–0.1)
EOS ABS: 0.2 10*3/uL (ref 0.0–0.7)
EOS PCT: 3 % (ref 0–5)
HCT: 41.7 % (ref 39.0–52.0)
Hemoglobin: 14.5 g/dL (ref 13.0–17.0)
LYMPHS ABS: 1 10*3/uL (ref 0.7–4.0)
Lymphocytes Relative: 20 % (ref 12–46)
MCH: 30.6 pg (ref 26.0–34.0)
MCHC: 34.8 g/dL (ref 30.0–36.0)
MCV: 88 fL (ref 78.0–100.0)
MONO ABS: 0.8 10*3/uL (ref 0.1–1.0)
MONOS PCT: 16 % — AB (ref 3–12)
MPV: 9.4 fL (ref 8.6–12.4)
Neutro Abs: 3.1 10*3/uL (ref 1.7–7.7)
Neutrophils Relative %: 61 % (ref 43–77)
PLATELETS: 169 10*3/uL (ref 150–400)
RBC: 4.74 MIL/uL (ref 4.22–5.81)
RDW: 14.1 % (ref 11.5–15.5)
WBC: 5 10*3/uL (ref 4.0–10.5)

## 2015-01-01 LAB — TSH: TSH: 7.398 u[IU]/mL — ABNORMAL HIGH (ref 0.350–4.500)

## 2015-01-02 ENCOUNTER — Telehealth: Payer: Self-pay | Admitting: Family Medicine

## 2015-01-02 DIAGNOSIS — Z79899 Other long term (current) drug therapy: Secondary | ICD-10-CM

## 2015-01-02 DIAGNOSIS — E039 Hypothyroidism, unspecified: Secondary | ICD-10-CM

## 2015-01-02 LAB — PSA, MEDICARE: PSA: 0.24 ng/mL (ref <=4.00)

## 2015-01-02 LAB — VITAMIN D 25 HYDROXY (VIT D DEFICIENCY, FRACTURES): VIT D 25 HYDROXY: 32 ng/mL (ref 30–100)

## 2015-01-02 MED ORDER — LEVOTHYROXINE SODIUM 88 MCG PO TABS: 88.0000 ug | ORAL_TABLET | Freq: Every day | ORAL | Status: DC

## 2015-01-02 NOTE — Telephone Encounter (Signed)
-----   Message from Dorena Bodo, PA-C sent at 01/02/2015  7:51 AM EDT ----- Thyroid lab is not normal. However just 5 months ago it was normal.  Verify with patient whether it is possible that he has skipped some doses.  If that is possible, then I would recommend he take it every single day and recheck TSH in 6 weeks.  If he feels quite certain that he has been taking it every day, with no skip doses, then increase dose of medication to 88 g daily then recheck TSH in 6 weeks.  Remainder of labs normal. Continue all other medications the same without changes.

## 2015-01-02 NOTE — Telephone Encounter (Signed)
Pt states has not missed any medication.  New dose Levothyroxine to pharmacy, repeat TSH 6 weeks

## 2015-02-12 ENCOUNTER — Other Ambulatory Visit: Payer: Self-pay | Admitting: Family Medicine

## 2015-02-12 DIAGNOSIS — G479 Sleep disorder, unspecified: Secondary | ICD-10-CM

## 2015-02-12 NOTE — Telephone Encounter (Signed)
Approved. #90+ 0. 

## 2015-02-12 NOTE — Telephone Encounter (Signed)
CPE 12/31/14  LRF Temazepam 11/09/14 #90 to mail order.  OK send another 90 refill?

## 2015-02-13 MED ORDER — LISINOPRIL-HYDROCHLOROTHIAZIDE 20-12.5 MG PO TABS: 2.0000 | ORAL_TABLET | Freq: Every day | ORAL | Status: DC

## 2015-02-13 MED ORDER — AMLODIPINE BESYLATE 10 MG PO TABS: 10.0000 mg | ORAL_TABLET | Freq: Every day | ORAL | Status: DC

## 2015-02-13 MED ORDER — TEMAZEPAM 15 MG PO CAPS: 15.0000 mg | ORAL_CAPSULE | Freq: Every evening | ORAL | Status: DC | PRN

## 2015-02-13 NOTE — Telephone Encounter (Signed)
Medication refilled per protocol.  Temazepam faxed to mail order

## 2015-03-01 ENCOUNTER — Other Ambulatory Visit: Payer: Self-pay | Admitting: Family Medicine

## 2015-03-01 NOTE — Telephone Encounter (Signed)
Medication refilled per protocol. 

## 2015-03-12 ENCOUNTER — Ambulatory Visit: Payer: Medicare Other

## 2015-03-12 ENCOUNTER — Other Ambulatory Visit: Payer: Medicare Other

## 2015-03-12 DIAGNOSIS — E039 Hypothyroidism, unspecified: Secondary | ICD-10-CM

## 2015-03-12 DIAGNOSIS — Z79899 Other long term (current) drug therapy: Secondary | ICD-10-CM

## 2015-03-13 ENCOUNTER — Other Ambulatory Visit: Payer: Self-pay | Admitting: Physician Assistant

## 2015-03-13 LAB — TSH: TSH: 2.861 u[IU]/mL (ref 0.350–4.500)

## 2015-03-13 NOTE — Telephone Encounter (Signed)
Medication refilled per protocol. 

## 2015-04-30 ENCOUNTER — Telehealth: Payer: Self-pay | Admitting: Family Medicine

## 2015-04-30 DIAGNOSIS — G479 Sleep disorder, unspecified: Secondary | ICD-10-CM

## 2015-04-30 NOTE — Telephone Encounter (Signed)
Pt needs a refill of Tamazepam capsules 15 mg to [facility] [phone]

## 2015-05-01 MED ORDER — TEMAZEPAM 15 MG PO CAPS: 15.0000 mg | ORAL_CAPSULE | Freq: Every evening | ORAL | Status: DC | PRN

## 2015-05-01 NOTE — Telephone Encounter (Signed)
?   OK to Refill  - LOV 12/31/14 CPE and LRF - 02/13/15 - ok to send to mail order

## 2015-05-01 NOTE — Telephone Encounter (Signed)
Approved. #90+ 0. 

## 2015-05-02 NOTE — Telephone Encounter (Signed)
rx faxed to mail order 

## 2015-06-25 ENCOUNTER — Other Ambulatory Visit: Payer: Self-pay | Admitting: Family Medicine

## 2015-06-25 ENCOUNTER — Encounter: Payer: Self-pay | Admitting: Family Medicine

## 2015-06-25 MED ORDER — PANTOPRAZOLE SODIUM 40 MG PO TBEC: 40.0000 mg | DELAYED_RELEASE_TABLET | Freq: Every day | ORAL | Status: DC

## 2015-06-25 NOTE — Telephone Encounter (Signed)
Medication refilled per protocol. 

## 2015-07-01 ENCOUNTER — Other Ambulatory Visit: Payer: Self-pay | Admitting: Family Medicine

## 2015-07-01 MED ORDER — LEVOTHYROXINE SODIUM 88 MCG PO TABS: 88.0000 ug | ORAL_TABLET | Freq: Every day | ORAL | Status: DC

## 2015-07-01 NOTE — Telephone Encounter (Signed)
Medication refilled per protocol. 

## 2015-07-19 ENCOUNTER — Other Ambulatory Visit: Payer: Self-pay | Admitting: Family Medicine

## 2015-07-19 MED ORDER — LISINOPRIL-HYDROCHLOROTHIAZIDE 20-12.5 MG PO TABS: 2.0000 | ORAL_TABLET | Freq: Every day | ORAL | Status: DC

## 2015-07-19 MED ORDER — AMLODIPINE BESYLATE 10 MG PO TABS: 10.0000 mg | ORAL_TABLET | Freq: Every day | ORAL | Status: DC

## 2015-07-19 NOTE — Telephone Encounter (Signed)
Medication refilled per protocol. 

## 2015-07-25 ENCOUNTER — Ambulatory Visit (INDEPENDENT_AMBULATORY_CARE_PROVIDER_SITE_OTHER): Payer: Medicare Other | Admitting: Physician Assistant

## 2015-07-25 ENCOUNTER — Encounter: Payer: Self-pay | Admitting: Physician Assistant

## 2015-07-25 VITALS — BP 100/68 | HR 60 | Temp 97.8°F | Resp 18 | Wt 175.0 lb

## 2015-07-25 DIAGNOSIS — M545 Low back pain: Secondary | ICD-10-CM

## 2015-07-25 DIAGNOSIS — L570 Actinic keratosis: Secondary | ICD-10-CM | POA: Diagnosis not present

## 2015-07-25 DIAGNOSIS — G4700 Insomnia, unspecified: Secondary | ICD-10-CM

## 2015-07-25 DIAGNOSIS — K219 Gastro-esophageal reflux disease without esophagitis: Secondary | ICD-10-CM | POA: Diagnosis not present

## 2015-07-25 DIAGNOSIS — J439 Emphysema, unspecified: Secondary | ICD-10-CM

## 2015-07-25 DIAGNOSIS — E039 Hypothyroidism, unspecified: Secondary | ICD-10-CM

## 2015-07-25 DIAGNOSIS — G8929 Other chronic pain: Secondary | ICD-10-CM | POA: Diagnosis not present

## 2015-07-25 DIAGNOSIS — I10 Essential (primary) hypertension: Secondary | ICD-10-CM

## 2015-07-25 LAB — BASIC METABOLIC PANEL WITH GFR
BUN: 16 mg/dL (ref 7–25)
CHLORIDE: 101 mmol/L (ref 98–110)
CO2: 22 mmol/L (ref 20–31)
Calcium: 9.1 mg/dL (ref 8.6–10.3)
Creat: 0.89 mg/dL (ref 0.70–1.25)
GFR, Est African American: >89 mL/min (ref >=60)
GFR, Est Non African American: 88 mL/min (ref >=60)
GLUCOSE: 108 mg/dL — AB (ref 70–99)
POTASSIUM: 4.1 mmol/L (ref 3.5–5.3)
Sodium: 136 mmol/L (ref 135–146)

## 2015-07-25 LAB — TSH: TSH: 0.01 m[IU]/L — AB (ref 0.40–4.50)

## 2015-07-25 NOTE — Progress Notes (Addendum)
Patient ID: Tommy Robbins MRN: [id number], DOB: Aug 26, 1945, 70 y.o. Date of Encounter: @DATE @  Chief Complaint:  Chief Complaint  Patient presents with  . routine check up/ med refills    is fasting    HPI: 70 y.o. year old male  Presents for routine f/u OV and labs.   LOV with me was for CPE/Preventive Care as well as routine f/u -- 12/31/2014.   Today his only complaint/concern is that he "feels rough spots on top of his head."  No other complaint/concern.   Taking thyroid medication daily.   Taking BP meds as directed. No lightheadedness.   Insomnia: controlled with Restoril. This causes no adverse effects.   Still going to Pain Clinic.   Has not started back smoking.   Past Medical History  Diagnosis Date  . Hypertension   . GERD (gastroesophageal reflux disease)   . Depression   . Hep C w/ coma, chronic (HCC)   . Thyroid disease     hypothyroid  . Colon polyps   . Diverticulosis   . Insomnia   . Chronic low back pain      Home Meds: Outpatient Prescriptions Prior to Visit  Medication Sig Dispense Refill  . amLODipine (NORVASC) 10 MG tablet Take 1 tablet (10 mg total) by mouth daily. 90 tablet 0  . gabapentin (NEURONTIN) 100 MG capsule TAKE 1 TO 2 CAPSULES BY MOUTH AT BEDTIME  3  . KLOR-CON M10 10 MEQ tablet TAKE 1 TABLET DAILY 90 tablet 1  . levothyroxine (SYNTHROID, LEVOTHROID) 88 MCG tablet Take 1 tablet (88 mcg total) by mouth daily before breakfast. 90 tablet 1  . lidocaine (LIDODERM) 5 % USE 3 PATCHES ON THE SKIN DAILY  4  . lisinopril-hydrochlorothiazide (PRINZIDE,ZESTORETIC) 20-12.5 MG tablet Take 2 tablets by mouth daily. 180 tablet 0  . morphine (MS CONTIN) 30 MG 12 hr tablet Take 30 mg by mouth every 8 (eight) hours. Takes 1 every 8 hrs TID    . Nutritional Supplements (JUICE PLUS FIBRE PO) Take 2 capsules by mouth 2 (two) times daily.    . pantoprazole (PROTONIX) 40 MG tablet Take 1 tablet (40 mg total) by mouth daily. 90 tablet 0  .  Suvorexant (BELSOMRA) 10 MG TABS Take 1 tablet by mouth at bedtime as needed. 10 tablet 0  . temazepam (RESTORIL) 15 MG capsule Take 1 capsule (15 mg total) by mouth at bedtime as needed for sleep. 90 capsule 0  . ADVAIR DISKUS 250-50 MCG/DOSE AEPB INHALE 1 PUFF INTO THE LUNGS 2 (TWO) TIMES DAILY. (Patient not taking: Reported on 12/31/2014) 60 each 1  . albuterol (PROVENTIL HFA;VENTOLIN HFA) 108 (90 BASE) MCG/ACT inhaler Inhale 2 puffs into the lungs every 4 (four) hours as needed for wheezing or shortness of breath. (Patient not taking: Reported on 12/31/2014) 1 Inhaler 3  . morphine (MS CONTIN) 60 MG 12 hr tablet Take 60 mg by mouth every 12 (twelve) hours.     . varenicline (CHANTIX CONTINUING MONTH PAK) 1 MG tablet Take 1 tablet (1 mg total) by mouth 2 (two) times daily. (Patient not taking: Reported on 12/31/2014) 60 tablet 1   No facility-administered medications prior to visit.    Allergies: No Known Allergies  Social History   Social History  . Marital Status: Divorced    Spouse Name: N/A  . Number of Children: N/A  . Years of Education: N/A   Occupational History  . Not on file.   Social History Main Topics  .  Smoking status: Former Smoker -- 1.00 packs/day for 50 years    Types: Cigarettes    Quit date: 06/10/2011  . Smokeless tobacco: Never Used  . Alcohol Use: Yes  . Drug Use: No  . Sexual Activity: Not on file   Other Topics Concern  . Not on file   Social History Narrative   Entered 12/2013:   Lives with his Sister, Niece, and Mother   They take turns caring for mother, who is 53   Quit smoking in 2013    Family History  Problem Relation Age of Onset  . Arthritis Father     rheumatoid  . Hypertension Sister   . Arthritis Sister      Review of Systems:  See HPI for pertinent ROS. All other ROS negative.    Physical Exam: Blood pressure 100/68, pulse 60, temperature 97.8 F (36.6 C), temperature source Oral, resp. rate 18, weight 175 lb (79.379  kg)., Body mass index is 26.61 kg/(m^2). General: WNWD WM. Kyphosis. Appears in no acute distress. Neck: Supple. No thyromegaly. No lymphadenopathy.No carotid bruits.  Lungs: Distant Decreased BS but no wheezes. Clear.  Heart: RRR with S1 S2. No murmurs, rubs, or gallops. Abdomen: Soft, non-tender, non-distended with normoactive bowel sounds. No hepatomegaly. No rebound/guarding. No obvious abdominal masses. Musculoskeletal:  Strength and tone normal for age. Extremities/Skin: Warm and dry. No LE edema.  Top of head--Bald---Areas of Actinic Keratosis.  Neuro: Alert and oriented X 3. Moves all extremities spontaneously. Gait is normal. CNII-XII grossly in tact. Psych:  Responds to questions appropriately with a normal affect.     ASSESSMENT AND PLAN:  70 y.o. year old male with  1. Hypothyroidism, unspecified hypothyroidism type On Repletion. Check lab to monitor. - TSH  2. HYPERTENSION, BENIGN ESSENTIAL BP well controlled. Cont current meds. Check lab to monitor. - BASIC METABOLIC PANEL WITH GFR  3. Pulmonary emphysema, unspecified emphysema type (HCC) Stable/Controlled  4. Gastroesophageal reflux disease, esophagitis presence not specified Stable/Controlled.  5. Insomnia Stable/Controlled.   6. Chronic low back pain Managed at Pain Clinic  Actinic Keratosis Cryotherapy applied to 7 areas of A.K. Told him to wear a hat any time he is outside to prevent further sun exposure.   ADDENDUM ADDED 10/02/2015: Colonoscopy performed by Dr. Sharrell Ku--- 10/01/2015. There is a polyp. Repeat colonoscopy 5 years.  F/U OV 6 months, sooner if needed.   Signed, [address] [place], [place], [facility] 07/25/2015 11:56 AM

## 2015-08-27 ENCOUNTER — Ambulatory Visit: Payer: Medicare Other | Admitting: Podiatry

## 2015-08-27 DIAGNOSIS — H5203 Hypermetropia, bilateral: Secondary | ICD-10-CM | POA: Diagnosis not present

## 2015-09-10 ENCOUNTER — Encounter: Payer: Self-pay | Admitting: Podiatry

## 2015-09-10 ENCOUNTER — Ambulatory Visit (INDEPENDENT_AMBULATORY_CARE_PROVIDER_SITE_OTHER): Payer: Medicare Other

## 2015-09-10 ENCOUNTER — Ambulatory Visit (INDEPENDENT_AMBULATORY_CARE_PROVIDER_SITE_OTHER): Payer: BC Managed Care – PPO | Admitting: Podiatry

## 2015-09-10 VITALS — BP 107/61 | HR 72 | Resp 16

## 2015-09-10 DIAGNOSIS — M216X2 Other acquired deformities of left foot: Secondary | ICD-10-CM

## 2015-09-10 DIAGNOSIS — M79673 Pain in unspecified foot: Secondary | ICD-10-CM | POA: Diagnosis not present

## 2015-09-10 NOTE — Progress Notes (Signed)
   Subjective:    Patient ID: Tommy Robbins, male    DOB: Aug 01, 1945, 70 y.o.   MRN: [id number]  HPI: He presents today with chief concern of the medial aspect of his left shoe seems to be wearing abnormally around his heel. He states that he has absolutely no foot pain no leg pain and knee pain but his family wanted him to come in because they thought it was abnormal for his shoes to wear like that. He states that his shoes are a dollar shoes from [employer] and his good shoes never wear like this. It's only the cheap shoes. He states that he doesn't think there is any problem with this.    Review of Systems  All other systems reviewed and are negative.      Objective:   Physical Exam: Vital signs are stable he is alert and oriented 3 pulses are palpable. Neurologic sensorium is intact deep tendon reflexes are intact muscle strength is normal bilateral. 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 Cutaneous evaluation does not demonstrate any reactive hyperkeratosis or skin breakdown.          Assessment & Plan:  Cavus foot bilateral. With normal shoewear.  Plan: Follow up with me on an as-needed basis discussed appropriate shoe gear.

## 2015-09-23 ENCOUNTER — Other Ambulatory Visit: Payer: Self-pay | Admitting: Family Medicine

## 2015-09-23 MED ORDER — POTASSIUM CHLORIDE CRYS ER 10 MEQ PO TBCR: 10.0000 meq | EXTENDED_RELEASE_TABLET | Freq: Every day | ORAL | Status: DC

## 2015-09-23 NOTE — Telephone Encounter (Signed)
Medication refilled per protocol. 

## 2015-10-01 DIAGNOSIS — Z86010 Personal history of colonic polyps: Secondary | ICD-10-CM | POA: Diagnosis not present

## 2015-10-01 DIAGNOSIS — K5730 Diverticulosis of large intestine without perforation or abscess without bleeding: Secondary | ICD-10-CM | POA: Diagnosis not present

## 2015-10-01 DIAGNOSIS — K641 Second degree hemorrhoids: Secondary | ICD-10-CM | POA: Diagnosis not present

## 2015-10-01 DIAGNOSIS — D120 Benign neoplasm of cecum: Secondary | ICD-10-CM | POA: Diagnosis not present

## 2015-10-01 LAB — HM COLONOSCOPY

## 2015-10-16 DIAGNOSIS — R350 Frequency of micturition: Secondary | ICD-10-CM | POA: Diagnosis not present

## 2015-10-16 DIAGNOSIS — N403 Nodular prostate with lower urinary tract symptoms: Secondary | ICD-10-CM | POA: Diagnosis not present

## 2015-10-18 ENCOUNTER — Other Ambulatory Visit: Payer: Self-pay | Admitting: *Deleted

## 2015-10-18 MED ORDER — AMLODIPINE BESYLATE 10 MG PO TABS: 10.0000 mg | ORAL_TABLET | Freq: Every day | ORAL | 0 refills | Status: DC

## 2015-10-18 MED ORDER — LISINOPRIL-HYDROCHLOROTHIAZIDE 20-12.5 MG PO TABS: 2.0000 | ORAL_TABLET | Freq: Every day | ORAL | 0 refills | Status: DC

## 2015-10-18 NOTE — Telephone Encounter (Signed)
Received fax requesting refill on Lisinopril/ HCTZ and Amlodipine.   Refill appropriate and filled per protocol.

## 2015-10-22 ENCOUNTER — Other Ambulatory Visit: Payer: Self-pay | Admitting: Physician Assistant

## 2015-10-22 NOTE — Telephone Encounter (Signed)
Medication refilled per protocol. 

## 2015-10-24 ENCOUNTER — Encounter: Payer: Self-pay | Admitting: Family Medicine

## 2015-10-29 ENCOUNTER — Telehealth: Payer: Self-pay | Admitting: Family Medicine

## 2015-10-29 NOTE — Telephone Encounter (Signed)
[facility]  Patient calling to see if he can get rerfill on his chantix sent if possible  And also wants to know if he can get handicap placcard  [phone] (H)

## 2015-10-30 NOTE — Telephone Encounter (Signed)
Prescription for Chantix approved. Can send prescription for starter Dosepak #1+0. Continuing Dosepak #1+5 refills. Also can complete a new handicap form. Please print this and have an M.D. sign.

## 2015-10-30 NOTE — Telephone Encounter (Signed)
MD please advise

## 2015-10-30 NOTE — Telephone Encounter (Signed)
Call placed to patient.   States that he has taken Chantix in the past, but he will need a starter pack at this time.    Also states that he had 5 year placard, but it expired. Reports that he did not know it was expired until he got a ticket.   Please advise.

## 2015-10-30 NOTE — Telephone Encounter (Signed)
I have not seen pt in > 1 year, as been seeing Tommy Robbins I dont see that he is already on Chantix, or is he asking for a new start? Also does he already have a handicap placard?

## 2015-10-31 MED ORDER — VARENICLINE TARTRATE 0.5 MG X 11 & 1 MG X 42 PO MISC: ORAL | 0 refills | Status: DC

## 2015-10-31 MED ORDER — VARENICLINE TARTRATE 1 MG PO TABS: 1.0000 mg | ORAL_TABLET | Freq: Two times a day (BID) | ORAL | 5 refills | Status: DC

## 2015-10-31 NOTE — Telephone Encounter (Signed)
Rx's for Chantix to mail order.  Handicap renewal in Dr Jeanice Lim office for signature.

## 2015-11-06 ENCOUNTER — Other Ambulatory Visit: Payer: Self-pay | Admitting: Family Medicine

## 2015-11-06 DIAGNOSIS — G479 Sleep disorder, unspecified: Secondary | ICD-10-CM

## 2015-11-06 NOTE — Telephone Encounter (Signed)
LRF 05/01/15 #90 to mail order.  LOV 07/25/15  OK refill to mail order again?

## 2015-11-06 NOTE — Telephone Encounter (Signed)
Approved. #90+ 0. 

## 2015-11-07 MED ORDER — TEMAZEPAM 15 MG PO CAPS: 15.0000 mg | ORAL_CAPSULE | Freq: Every evening | ORAL | 0 refills | Status: DC | PRN

## 2015-11-07 NOTE — Telephone Encounter (Signed)
Rx printed and faxed to [facility]

## 2015-12-06 ENCOUNTER — Other Ambulatory Visit: Payer: Self-pay

## 2015-12-25 DIAGNOSIS — H25812 Combined forms of age-related cataract, left eye: Secondary | ICD-10-CM | POA: Diagnosis not present

## 2015-12-25 DIAGNOSIS — H2512 Age-related nuclear cataract, left eye: Secondary | ICD-10-CM | POA: Diagnosis not present

## 2016-01-08 ENCOUNTER — Ambulatory Visit (INDEPENDENT_AMBULATORY_CARE_PROVIDER_SITE_OTHER): Payer: BC Managed Care – PPO | Admitting: Physician Assistant

## 2016-01-08 ENCOUNTER — Encounter: Payer: Self-pay | Admitting: Physician Assistant

## 2016-01-08 VITALS — BP 106/60 | HR 65 | Temp 97.5°F | Resp 16 | Wt 164.0 lb

## 2016-01-08 DIAGNOSIS — H6502 Acute serous otitis media, left ear: Secondary | ICD-10-CM

## 2016-01-08 MED ORDER — CETIRIZINE HCL 10 MG PO TABS: 10.0000 mg | ORAL_TABLET | Freq: Every day | ORAL | 11 refills | Status: DC

## 2016-01-08 NOTE — Patient Instructions (Signed)
and

## 2016-01-08 NOTE — Progress Notes (Signed)
Patient ID: Tommy Robbins MRN: [id number], DOB: [birth year], 70 y.o. Date of Encounter: 01/08/2016, 10:36 AM    Chief Complaint:  Chief Complaint  Patient presents with  . Otalgia    left ear      HPI: 70 y.o. year old male presents with above.   Says that left ear feels like there is water in his ear or something. Says it isn't feeling that way and also feeling a little bit itchy for several days but has gotten more uncomfortable so came in. States that he has had no nasal congestion her thick dark mucus from the nose. Says that he "always has some runny nose this time of year ". Has had no sore throat. No cough or chest congestion. No fevers or chills.     Home Meds:   Outpatient Medications Prior to Visit  Medication Sig Dispense Refill  . amLODipine (NORVASC) 10 MG tablet Take 1 tablet (10 mg total) by mouth daily. 90 tablet 0  . gabapentin (NEURONTIN) 100 MG capsule TAKE 1 TO 2 CAPSULES BY MOUTH AT BEDTIME  3  . levothyroxine (SYNTHROID, LEVOTHROID) 88 MCG tablet Take 1 tablet (88 mcg total) by mouth daily before breakfast. 90 tablet 1  . lidocaine (LIDODERM) 5 % USE 3 PATCHES ON THE SKIN DAILY  4  . lisinopril-hydrochlorothiazide (PRINZIDE,ZESTORETIC) 20-12.5 MG tablet Take 2 tablets by mouth daily. 180 tablet 0  . morphine (MS CONTIN) 15 MG 12 hr tablet Take 15 mg by mouth every morning. With 30 mg dose to equal 45 mg each morning  0  . morphine (MS CONTIN) 30 MG 12 hr tablet Take 30 mg by mouth every 8 (eight) hours. Takes 1 every 8 hrs TID    . Nutritional Supplements (JUICE PLUS FIBRE PO) Take 2 capsules by mouth 2 (two) times daily.    . pantoprazole (PROTONIX) 40 MG tablet TAKE 1 TABLET DAILY 90 tablet 1  . potassium chloride (KLOR-CON M10) 10 MEQ tablet Take 1 tablet (10 mEq total) by mouth daily. 90 tablet 1  . Suvorexant (BELSOMRA) 10 MG TABS Take 1 tablet by mouth at bedtime as needed. 10 tablet 0  . temazepam (RESTORIL) 15 MG capsule Take 1 capsule (15 mg total)  by mouth at bedtime as needed for sleep. 90 capsule 0  . varenicline (CHANTIX) 1 MG tablet Take 1 tablet (1 mg total) by mouth 2 (two) times daily. 60 tablet 5  . ADVAIR DISKUS 250-50 MCG/DOSE AEPB INHALE 1 PUFF INTO THE LUNGS 2 (TWO) TIMES DAILY. (Patient not taking: Reported on 01/08/2016) 60 each 1  . albuterol (PROVENTIL HFA;VENTOLIN HFA) 108 (90 BASE) MCG/ACT inhaler Inhale 2 puffs into the lungs every 4 (four) hours as needed for wheezing or shortness of breath. (Patient not taking: Reported on 01/08/2016) 1 Inhaler 3  . varenicline (CHANTIX PAK) 0.5 MG X 11 & 1 MG X 42 tablet Take one 0.5 mg tablet by mouth once daily for 3 days, then increase to one 0.5 mg tablet twice daily for 4 days, then increase to one 1 mg tablet twice daily. 53 tablet 0   No facility-administered medications prior to visit.     Allergies: No Known Allergies    Review of Systems: See HPI for pertinent ROS. All other ROS negative.    Physical Exam: Blood pressure 106/60, pulse 65, temperature 97.5 F (36.4 C), temperature source Oral, resp. rate 16, weight 164 lb (74.4 kg), SpO2 99 %., Body mass index is 24.94 kg/m.  General:  WNWD WM. Appears in no acute distress. HEENT: Normocephalic, atraumatic, eyes without discharge, sclera non-icteric, nares are without discharge. Bilateral auditory canals clear. Right TM is clear, normal. Left TM--there is clear / effusion.  Oral cavity moist, posterior pharynx without exudate, erythema, peritonsillar abscess. Neck: Supple. No thyromegaly. No lymphadenopathy. Lungs: Clear bilaterally to auscultation without wheezes, rales, or rhonchi. Breathing is unlabored. Heart: Regular rhythm. No murmurs, rubs, or gallops. Msk:  Strength and tone normal for age. Extremities/Skin: Warm and dry.  Neuro: Alert and oriented X 3. Moves all extremities spontaneously. Gait is normal. CNII-XII grossly in tact. Psych:  Responds to questions appropriately with a normal affect.      ASSESSMENT AND PLAN:  70 y.o. year old male with  1. Acute serous otitis media of left ear, recurrence not specified He is to take Zyrtec daily PRN  Also gave him samples of Norel AD---to take 1 every 4 hours as needed for until these congestion symptoms resolve. If pain in ear increases or if current symptoms are not controlled in one week then call and we'll consider adding antibiotic that he does not need antibiotic at this time. - cetirizine (ZYRTEC) 10 MG tablet; Take 1 tablet (10 mg total) by mouth daily.  Dispense: 30 tablet; Refill: [address]   Signed, Karem Farha Beth [place], [place], [facility] 01/08/2016 10:36 AM

## 2016-01-13 DIAGNOSIS — H25811 Combined forms of age-related cataract, right eye: Secondary | ICD-10-CM | POA: Diagnosis not present

## 2016-01-13 DIAGNOSIS — H2511 Age-related nuclear cataract, right eye: Secondary | ICD-10-CM | POA: Diagnosis not present

## 2016-01-14 ENCOUNTER — Other Ambulatory Visit: Payer: Self-pay | Admitting: *Deleted

## 2016-01-14 MED ORDER — AMLODIPINE BESYLATE 10 MG PO TABS: 10.0000 mg | ORAL_TABLET | Freq: Every day | ORAL | 0 refills | Status: DC

## 2016-01-14 MED ORDER — LISINOPRIL-HYDROCHLOROTHIAZIDE 20-12.5 MG PO TABS
2.0000 | ORAL_TABLET | Freq: Every day | ORAL | 0 refills | Status: DC
Start: 2016-01-14 — End: 2016-04-13

## 2016-01-14 NOTE — Telephone Encounter (Signed)
Received fax requesting refill on Lisinopril/HCTZ and Norvasc.   Refill appropriate and filled per protocol. 

## 2016-02-03 ENCOUNTER — Other Ambulatory Visit: Payer: Self-pay | Admitting: Physician Assistant

## 2016-02-03 NOTE — Telephone Encounter (Signed)
Rx refilled per protocol 

## 2016-02-07 ENCOUNTER — Telehealth: Payer: Self-pay

## 2016-02-07 NOTE — Telephone Encounter (Signed)
Temazepam needed a PA  was approved on 02-06-2016  for 15 capsules  Per month.  Pt had one more refiill from 8-17 and was given 45 capsules. PT is not due for another refill until 04-07-2016

## 2016-02-24 DIAGNOSIS — H59031 Cystoid macular edema following cataract surgery, right eye: Secondary | ICD-10-CM | POA: Diagnosis not present

## 2016-02-24 DIAGNOSIS — H4321 Crystalline deposits in vitreous body, right eye: Secondary | ICD-10-CM | POA: Diagnosis not present

## 2016-02-27 ENCOUNTER — Ambulatory Visit: Payer: BC Managed Care – PPO | Admitting: Family Medicine

## 2016-02-28 ENCOUNTER — Encounter: Payer: Self-pay | Admitting: Family Medicine

## 2016-02-28 ENCOUNTER — Ambulatory Visit (INDEPENDENT_AMBULATORY_CARE_PROVIDER_SITE_OTHER): Payer: BC Managed Care – PPO | Admitting: Family Medicine

## 2016-02-28 VITALS — BP 118/74 | HR 62 | Temp 98.0°F | Resp 16 | Wt 170.0 lb

## 2016-02-28 DIAGNOSIS — L02811 Cutaneous abscess of head [any part, except face]: Secondary | ICD-10-CM

## 2016-02-28 DIAGNOSIS — L03811 Cellulitis of head [any part, except face]: Secondary | ICD-10-CM

## 2016-02-28 MED ORDER — SULFAMETHOXAZOLE-TRIMETHOPRIM 800-160 MG PO TABS: 1.0000 | ORAL_TABLET | Freq: Two times a day (BID) | ORAL | 0 refills | Status: DC

## 2016-02-28 NOTE — Progress Notes (Signed)
Subjective:    Patient ID: Tommy Robbins, male    DOB: [birth year], 70 y.o.   MRN: [id number]  HPI  Patient presents today with left ear pain.  Visually, the left tragus is erythematous swollen painful. A pustule has come to the head on the surface. I squeezed the tragus and I'm able to express yellow opaque pus. The tragus is erythematous warm and painful. Past Medical History:  Diagnosis Date  . Cataract   . Chronic low back pain   . Colon polyps   . Depression   . Diverticulosis   . GERD (gastroesophageal reflux disease)   . Hep C w/ coma, chronic (HCC)   . Hypertension   . Insomnia   . Thyroid disease    hypothyroid   Past Surgical History:  Procedure Laterality Date  . JOINT REPLACEMENT Right 03/23/2008   Shoulder- Replacement   Current Outpatient Prescriptions on File Prior to Visit  Medication Sig Dispense Refill  . amLODipine (NORVASC) 10 MG tablet Take 1 tablet (10 mg total) by mouth daily. 90 tablet 0  . cetirizine (ZYRTEC) 10 MG tablet Take 1 tablet (10 mg total) by mouth daily. 30 tablet 11  . gabapentin (NEURONTIN) 100 MG capsule TAKE 1 TO 2 CAPSULES BY MOUTH AT BEDTIME  3  . levothyroxine (SYNTHROID, LEVOTHROID) 88 MCG tablet TAKE 1 TABLET DAILY BEFORE BREAKFAST 90 tablet 2  . lidocaine (LIDODERM) 5 % USE 3 PATCHES ON THE SKIN DAILY  4  . lisinopril-hydrochlorothiazide (PRINZIDE,ZESTORETIC) 20-12.5 MG tablet Take 2 tablets by mouth daily. 180 tablet 0  . morphine (MS CONTIN) 15 MG 12 hr tablet Take 15 mg by mouth every morning. With 30 mg dose to equal 45 mg each morning  0  . morphine (MS CONTIN) 30 MG 12 hr tablet Take 30 mg by mouth every 8 (eight) hours. Takes 1 every 8 hrs TID    . Nutritional Supplements (JUICE PLUS FIBRE PO) Take 2 capsules by mouth 2 (two) times daily.    . pantoprazole (PROTONIX) 40 MG tablet TAKE 1 TABLET DAILY 90 tablet 1  . potassium chloride (KLOR-CON M10) 10 MEQ tablet Take 1 tablet (10 mEq total) by mouth daily. 90 tablet 1  .  Suvorexant (BELSOMRA) 10 MG TABS Take 1 tablet by mouth at bedtime as needed. 10 tablet 0  . temazepam (RESTORIL) 15 MG capsule Take 1 capsule (15 mg total) by mouth at bedtime as needed for sleep. 90 capsule 0  . ADVAIR DISKUS 250-50 MCG/DOSE AEPB INHALE 1 PUFF INTO THE LUNGS 2 (TWO) TIMES DAILY. (Patient not taking: Reported on 02/28/2016) 60 each 1  . albuterol (PROVENTIL HFA;VENTOLIN HFA) 108 (90 BASE) MCG/ACT inhaler Inhale 2 puffs into the lungs every 4 (four) hours as needed for wheezing or shortness of breath. (Patient not taking: Reported on 02/28/2016) 1 Inhaler 3  . varenicline (CHANTIX) 1 MG tablet Take 1 tablet (1 mg total) by mouth 2 (two) times daily. (Patient not taking: Reported on 02/28/2016) 60 tablet 5   No current facility-administered medications on file prior to visit.    No Known Allergies Social History   Social History  . Marital status: Divorced    Spouse name: N/A  . Number of children: N/A  . Years of education: N/A   Occupational History  . Not on file.   Social History Main Topics  . Smoking status: Former Smoker    Packs/day: 1.00    Years: 50.00    Types: Cigarettes  Quit date: 06/10/2011  . Smokeless tobacco: Never Used  . Alcohol use Yes  . Drug use: No  . Sexual activity: Not on file   Other Topics Concern  . Not on file   Social History Narrative   Entered 12/2013:   Lives with his Sister, Niece, and Mother   They take turns caring for mother, who is [id number]   Quit smoking in 2013     Review of Systems  All other systems reviewed and are negative.      Objective:   Physical Exam  HENT:  Right Ear: Tympanic membrane and ear canal normal. Tympanic membrane is not injected, not scarred, not perforated, not erythematous, not retracted and not bulging.  Left Ear: Tympanic membrane and ear canal normal. There is swelling. Tympanic membrane is not injected, not scarred, not perforated, not erythematous, not retracted and not bulging.   Ears:  Cardiovascular: Normal rate and regular rhythm.   Pulmonary/Chest: Effort normal and breath sounds normal.  Vitals reviewed.         Assessment & Plan:  Cellulitis and abscess of head - Plan: sulfamethoxazole-trimethoprim (BACTRIM DS,SEPTRA DS) 800-160 MG tablet  Patient has cellulitis and an evolving abscess in the left tragus. Has spontaneously ruptured and is draining purulent material. Begin Bactrim double strength tablets 1 by mouth twice a day for 7 days. Warm compresses 3 times a day

## 2016-03-09 ENCOUNTER — Other Ambulatory Visit: Payer: Self-pay | Admitting: *Deleted

## 2016-03-09 MED ORDER — POTASSIUM CHLORIDE CRYS ER 10 MEQ PO TBCR: 10.0000 meq | EXTENDED_RELEASE_TABLET | Freq: Every day | ORAL | 1 refills | Status: DC

## 2016-03-09 NOTE — Telephone Encounter (Signed)
Received fax requesting refill on K+.  Refill appropriate and filled per protocol. 

## 2016-03-30 DIAGNOSIS — H4321 Crystalline deposits in vitreous body, right eye: Secondary | ICD-10-CM | POA: Diagnosis not present

## 2016-03-30 DIAGNOSIS — H35423 Microcystoid degeneration of retina, bilateral: Secondary | ICD-10-CM | POA: Diagnosis not present

## 2016-04-13 ENCOUNTER — Other Ambulatory Visit: Payer: Self-pay | Admitting: *Deleted

## 2016-04-13 MED ORDER — LISINOPRIL-HYDROCHLOROTHIAZIDE 20-12.5 MG PO TABS: 2.0000 | ORAL_TABLET | Freq: Every day | ORAL | 0 refills | Status: DC

## 2016-04-13 MED ORDER — AMLODIPINE BESYLATE 10 MG PO TABS: 10.0000 mg | ORAL_TABLET | Freq: Every day | ORAL | 0 refills | Status: DC

## 2016-04-13 NOTE — Telephone Encounter (Signed)
Received fax requesting refill on Lisinopril/HCTZ.   Refill appropriate and filled per protocol. 

## 2016-05-04 ENCOUNTER — Other Ambulatory Visit: Payer: Self-pay

## 2016-05-04 DIAGNOSIS — G479 Sleep disorder, unspecified: Secondary | ICD-10-CM

## 2016-05-05 MED ORDER — TEMAZEPAM 15 MG PO CAPS: 15.0000 mg | ORAL_CAPSULE | Freq: Every evening | ORAL | 0 refills | Status: DC | PRN

## 2016-05-05 NOTE — Telephone Encounter (Signed)
Approved. #90+ 0. 

## 2016-05-05 NOTE — Telephone Encounter (Signed)
Last OV 8-20  Last refill 04-07-2016 Ok to refill?

## 2016-05-05 NOTE — Telephone Encounter (Signed)
RX filled.

## 2016-05-07 ENCOUNTER — Other Ambulatory Visit: Payer: Self-pay

## 2016-05-07 DIAGNOSIS — G479 Sleep disorder, unspecified: Secondary | ICD-10-CM

## 2016-05-07 MED ORDER — TEMAZEPAM 15 MG PO CAPS: 15.0000 mg | ORAL_CAPSULE | Freq: Every evening | ORAL | 0 refills | Status: DC | PRN

## 2016-05-07 NOTE — Telephone Encounter (Signed)
Fax from [facility] states pt can only get a quantity of 45 every 75 days without a PA

## 2016-05-19 ENCOUNTER — Telehealth: Payer: Self-pay

## 2016-05-19 DIAGNOSIS — G479 Sleep disorder, unspecified: Secondary | ICD-10-CM

## 2016-05-19 NOTE — Telephone Encounter (Signed)
Received a fax from [facility] indicating a PA was needed for Restoril 15 mg. PT was allowed a quantity of 45 every 75 days and it appears the pt received the max this year.  I spoke with an agent Twyla at [facility] and she stated I needed to do a prior authorization.  Prior Authorization has been started

## 2016-05-19 NOTE — Telephone Encounter (Signed)
Mark with [facility] called to get additional information regarding the prior authorization. Including how often pt is to take restoril 15 mg. Explained pt is to take as needed for sleep Loraine Leriche stated he will continue to work on the Prior Authorization and that I could check the status is about an hour

## 2016-05-21 MED ORDER — TEMAZEPAM 15 MG PO CAPS: 15.0000 mg | ORAL_CAPSULE | Freq: Every evening | ORAL | 0 refills | Status: DC | PRN

## 2016-05-21 NOTE — Telephone Encounter (Signed)
Prior authorization was approved  through 05-20-2019

## 2016-07-12 ENCOUNTER — Other Ambulatory Visit: Payer: Self-pay | Admitting: Physician Assistant

## 2016-07-13 ENCOUNTER — Other Ambulatory Visit: Payer: Self-pay | Admitting: *Deleted

## 2016-07-13 MED ORDER — LISINOPRIL-HYDROCHLOROTHIAZIDE 20-12.5 MG PO TABS: 2.0000 | ORAL_TABLET | Freq: Every day | ORAL | 0 refills | Status: DC

## 2016-07-13 MED ORDER — AMLODIPINE BESYLATE 10 MG PO TABS: 10.0000 mg | ORAL_TABLET | Freq: Every day | ORAL | 0 refills | Status: DC

## 2016-07-13 NOTE — Telephone Encounter (Signed)
Medication refilled per protocol. Patient due for a 6 month office visit. Letter mailed to sch f/u appointment

## 2016-07-30 ENCOUNTER — Telehealth: Payer: Self-pay | Admitting: *Deleted

## 2016-07-30 DIAGNOSIS — G479 Sleep disorder, unspecified: Secondary | ICD-10-CM

## 2016-07-30 NOTE — Telephone Encounter (Signed)
Received fax requesting refill on Restoril.   Ok to refill to mail order?  Last office visit 02/28/2016.  Last refill 05/21/2016.

## 2016-07-31 ENCOUNTER — Other Ambulatory Visit: Payer: Self-pay

## 2016-07-31 DIAGNOSIS — G479 Sleep disorder, unspecified: Secondary | ICD-10-CM

## 2016-07-31 MED ORDER — TEMAZEPAM 15 MG PO CAPS: 15.0000 mg | ORAL_CAPSULE | Freq: Every evening | ORAL | 0 refills | Status: DC | PRN

## 2016-07-31 NOTE — Telephone Encounter (Signed)
okay

## 2016-07-31 NOTE — Telephone Encounter (Signed)
Prescription faxed

## 2016-08-01 ENCOUNTER — Other Ambulatory Visit: Payer: Self-pay | Admitting: Physician Assistant

## 2016-08-25 DIAGNOSIS — H35033 Hypertensive retinopathy, bilateral: Secondary | ICD-10-CM | POA: Diagnosis not present

## 2016-08-25 DIAGNOSIS — H04123 Dry eye syndrome of bilateral lacrimal glands: Secondary | ICD-10-CM | POA: Diagnosis not present

## 2016-08-25 DIAGNOSIS — H43811 Vitreous degeneration, right eye: Secondary | ICD-10-CM | POA: Diagnosis not present

## 2016-08-25 DIAGNOSIS — H16223 Keratoconjunctivitis sicca, not specified as Sjogren's, bilateral: Secondary | ICD-10-CM | POA: Diagnosis not present

## 2016-09-09 ENCOUNTER — Other Ambulatory Visit: Payer: Self-pay | Admitting: *Deleted

## 2016-09-09 MED ORDER — POTASSIUM CHLORIDE CRYS ER 10 MEQ PO TBCR: 10.0000 meq | EXTENDED_RELEASE_TABLET | Freq: Every day | ORAL | 1 refills | Status: DC

## 2016-09-30 ENCOUNTER — Other Ambulatory Visit: Payer: Self-pay | Admitting: Physician Assistant

## 2016-10-01 ENCOUNTER — Ambulatory Visit (INDEPENDENT_AMBULATORY_CARE_PROVIDER_SITE_OTHER): Payer: BC Managed Care – PPO | Admitting: Physician Assistant

## 2016-10-01 ENCOUNTER — Telehealth: Payer: Self-pay | Admitting: *Deleted

## 2016-10-01 ENCOUNTER — Encounter: Payer: Self-pay | Admitting: Physician Assistant

## 2016-10-01 VITALS — BP 110/78 | HR 69 | Temp 97.5°F | Resp 16 | Wt 165.4 lb

## 2016-10-01 DIAGNOSIS — G479 Sleep disorder, unspecified: Secondary | ICD-10-CM

## 2016-10-01 DIAGNOSIS — L0291 Cutaneous abscess, unspecified: Secondary | ICD-10-CM

## 2016-10-01 MED ORDER — TEMAZEPAM 15 MG PO CAPS: 15.0000 mg | ORAL_CAPSULE | Freq: Every evening | ORAL | 0 refills | Status: DC | PRN

## 2016-10-01 MED ORDER — SULFAMETHOXAZOLE-TRIMETHOPRIM 800-160 MG PO TABS: 1.0000 | ORAL_TABLET | Freq: Two times a day (BID) | ORAL | 0 refills | Status: DC

## 2016-10-01 NOTE — Telephone Encounter (Signed)
Ok to refill x 1 to mail order for restoril.

## 2016-10-01 NOTE — Telephone Encounter (Signed)
Prescription faxed

## 2016-10-01 NOTE — Telephone Encounter (Signed)
Received fax requesting refill on Amlodipine, Lisinopril/ HCTZ, Pantoprazole, and Restoril to mail order.   Prescription sent to pharmacy for routine medications.   Ok to refill??  Last office visit 10/01/2016.  Last refill 07/31/2016 to mail order.

## 2016-10-01 NOTE — Progress Notes (Signed)
Patient ID: Tommy Robbins MRN: [id number], DOB: June 28, 1945, 71 y.o. Date of Encounter: 10/01/2016, 10:30 AM    Chief Complaint:  Chief Complaint  Patient presents with  . cyst on right leg    x1week      HPI: 71 y.o. year old male presents with above.   Says he had noticed a bump on right medial thigh. Then saw spot of blood and drainage on toilet seat--so came in.     Home Meds:   Outpatient Medications Prior to Visit  Medication Sig Dispense Refill  . amLODipine (NORVASC) 10 MG tablet Take 1 tablet (10 mg total) by mouth daily. 90 tablet 0  . gabapentin (NEURONTIN) 100 MG capsule TAKE 1 TO 2 CAPSULES BY MOUTH AT BEDTIME  3  . levothyroxine (SYNTHROID, LEVOTHROID) 88 MCG tablet TAKE 1 TABLET DAILY BEFORE BREAKFAST 90 tablet 2  . lidocaine (LIDODERM) 5 % USE 3 PATCHES ON THE SKIN DAILY  4  . lisinopril-hydrochlorothiazide (PRINZIDE,ZESTORETIC) 20-12.5 MG tablet Take 2 tablets by mouth daily. 180 tablet 0  . morphine (MS CONTIN) 15 MG 12 hr tablet Take 15 mg by mouth every morning. With 30 mg dose to equal 45 mg each morning  0  . morphine (MS CONTIN) 30 MG 12 hr tablet Take 30 mg by mouth every 8 (eight) hours. Takes 1 every 8 hrs TID    . Nutritional Supplements (JUICE PLUS FIBRE PO) Take 2 capsules by mouth 2 (two) times daily.    . pantoprazole (PROTONIX) 40 MG tablet TAKE 1 TABLET DAILY 30 tablet 0  . potassium chloride (KLOR-CON M10) 10 MEQ tablet Take 1 tablet (10 mEq total) by mouth daily. 90 tablet 1  . Suvorexant (BELSOMRA) 10 MG TABS Take 1 tablet by mouth at bedtime as needed. 10 tablet 0  . temazepam (RESTORIL) 15 MG capsule Take 1 capsule (15 mg total) by mouth at bedtime as needed for sleep. 45 capsule 0  . varenicline (CHANTIX) 1 MG tablet Take 1 tablet (1 mg total) by mouth 2 (two) times daily. 60 tablet 5  . ADVAIR DISKUS 250-50 MCG/DOSE AEPB INHALE 1 PUFF INTO THE LUNGS 2 (TWO) TIMES DAILY. (Patient not taking: Reported on 02/28/2016) 60 each 1  . albuterol  (PROVENTIL HFA;VENTOLIN HFA) 108 (90 BASE) MCG/ACT inhaler Inhale 2 puffs into the lungs every 4 (four) hours as needed for wheezing or shortness of breath. (Patient not taking: Reported on 02/28/2016) 1 Inhaler 3  . cetirizine (ZYRTEC) 10 MG tablet Take 1 tablet (10 mg total) by mouth daily. 30 tablet 11  . sulfamethoxazole-trimethoprim (BACTRIM DS,SEPTRA DS) 800-160 MG tablet Take 1 tablet by mouth 2 (two) times daily. 14 tablet 0   No facility-administered medications prior to visit.     Allergies: No Known Allergies    Review of Systems: See HPI for pertinent ROS. All other ROS negative.    Physical Exam: Blood pressure 110/78, pulse 69, temperature (!) 97.5 F (36.4 C), temperature source Oral, resp. rate 16, weight 165 lb 6.4 oz (75 kg), SpO2 98 %., Body mass index is 25.15 kg/m. General:  Thin WM. Appears in no acute distress. Neck: Supple. No thyromegaly. No lymphadenopathy. Lungs: Clear bilaterally to auscultation without wheezes, rales, or rhonchi. Breathing is unlabored. Heart: Regular rhythm. No murmurs, rubs, or gallops. Msk:  Strength and tone normal for age. Extremities/Skin: Medial aspect of right upper thigh---- ~ 1 cm area of firmness. This is already draining. There is no area of fluctuance to incise.  Neuro: Alert and oriented X 3. Moves all extremities spontaneously. Gait is normal. CNII-XII grossly in tact. Psych:  Responds to questions appropriately with a normal affect.     ASSESSMENT AND PLAN:  71 y.o. year old male with  1. Abscess Take abx as directed. Monitor site. If worsens or does not resolve upon completion of abx, then f/u.  Also discussed that this is infection and discussed proper hygiene etc to prevent spread of infection. - sulfamethoxazole-trimethoprim (BACTRIM DS,SEPTRA DS) 800-160 MG tablet; Take 1 tablet by mouth 2 (two) times daily.  Dispense: 20 tablet; Refill: 0   Signed, [address] [place], [place], [facility] 10/01/2016 10:30 AM

## 2016-10-06 ENCOUNTER — Ambulatory Visit (INDEPENDENT_AMBULATORY_CARE_PROVIDER_SITE_OTHER): Payer: BC Managed Care – PPO | Admitting: Family Medicine

## 2016-10-06 ENCOUNTER — Encounter: Payer: Self-pay | Admitting: Family Medicine

## 2016-10-06 VITALS — BP 112/68 | HR 66 | Temp 97.4°F | Resp 14 | Ht 62.0 in | Wt 167.0 lb

## 2016-10-06 DIAGNOSIS — L02415 Cutaneous abscess of right lower limb: Secondary | ICD-10-CM

## 2016-10-06 DIAGNOSIS — L03115 Cellulitis of right lower limb: Secondary | ICD-10-CM

## 2016-10-06 DIAGNOSIS — S81801S Unspecified open wound, right lower leg, sequela: Secondary | ICD-10-CM | POA: Diagnosis not present

## 2016-10-06 HISTORY — DX: Cellulitis of right lower limb: L03.115

## 2016-10-06 HISTORY — DX: Cellulitis of right lower limb: L02.415

## 2016-10-06 MED ORDER — DOXYCYCLINE HYCLATE 100 MG PO TABS: 100.0000 mg | ORAL_TABLET | Freq: Two times a day (BID) | ORAL | 0 refills | Status: DC

## 2016-10-06 NOTE — Patient Instructions (Addendum)
Take the doxycycline Stop the bactrim  Referral to wound clinic

## 2016-10-06 NOTE — Progress Notes (Signed)
   Subjective:    Patient ID: Tommy Robbins, male    DOB: 08-Jun-1945, 71 y.o.   MRN: [id number]  Patient presents for Abscess (has been on ABTx x4 days)  Patient here with worsening abscess to his right posterior thigh. He was seen on July 12 at that time he had a small pimple-like lesion, but was getting larger. He came in was given Bactrim DS note I indeed was performed at that time. Over the weekend he states it is worsened on this opening is still draining a good amount. He has not had any fever no chills he does have pain at the site is on chronic pain medication. He does not recall something bit him   Review Of Systems:  GEN- denies fatigue, fever, weight loss,weakness, recent illness HEENT- denies eye drainage, change in vision, nasal discharge, CVS- denies chest pain, palpitations RESP- denies SOB, cough, wheeze ABD- denies N/V, change in stools, abd pain GU- denies dysuria, hematuria, dribbling, incontinence MSK- denies joint pain, muscle aches, injury Neuro- denies headache, dizziness, syncope, seizure activity       Objective:    BP 112/68   Pulse 66   Temp (!) 97.4 F (36.3 C) (Oral)   Resp 14   Ht 5\' 2"  (1.575 m)   Wt 167 lb (75.8 kg)   SpO2 98%   BMI 30.54 kg/m  GEN- NAD, alert and oriented x3 Skin- Right post thigh- circular with  Yellow exudate/pus leaking tissue appears to be necrosis on both ends, wound 3x3.5x2cm  Ext- no edema        Assessment & Plan:      Problem List Items Addressed This Visit    None    Visit Diagnoses    Cellulitis and abscess of right leg    -  Primary   Worsening abscess seems to be getting necrosis of the tissue and deepening of the wound on the sides, yellow exudate, wound culture taken. I did inject with Lidocaine to evaluate the wound some areas didn't bleed easily with a chief hemostasis very quickly. There was no specific area of abscess to drain. I did not make any incisions. I went ahead and dressed the wound and we  called urgently for wound appointment which she will be seen on Thursday morning. I'll go ahead and stop the Bactrim and start him on doxycycline. Where if he had some type of spider bite or other infectious lesion to cause this rapid deterioration of the wound. He is not having any systemic symptoms    Relevant Orders   WOUND CULTURE   Ambulatory referral to Wound Clinic   Wound of right leg, sequela       Relevant Orders   WOUND CULTURE   Ambulatory referral to Wound Clinic      Note: This dictation was prepared with Dragon dictation along with smaller phrase technology. Any transcriptional errors that result from this process are unintentional.

## 2016-10-09 DIAGNOSIS — L02415 Cutaneous abscess of right lower limb: Secondary | ICD-10-CM | POA: Diagnosis not present

## 2016-10-09 LAB — WOUND CULTURE
Gram Stain: NONE SEEN
Gram Stain: NONE SEEN
Gram Stain: NONE SEEN

## 2016-10-24 ENCOUNTER — Encounter (HOSPITAL_COMMUNITY)
Admission: RE | Disposition: A | Discharge status: Home / Self Care | Payer: Self-pay | Source: Other Acute Inpatient Hospital | Attending: Cardiovascular Disease

## 2016-10-24 ENCOUNTER — Inpatient Hospital Stay (HOSPITAL_COMMUNITY)
Admission: RE | Admit: 2016-10-24 | Discharge: 2016-10-29 | DRG: 270 | Disposition: A | Discharge status: Home / Self Care | Payer: BC Managed Care – PPO | Source: Other Acute Inpatient Hospital | Attending: Cardiovascular Disease | Admitting: Cardiovascular Disease

## 2016-10-24 DIAGNOSIS — I2510 Atherosclerotic heart disease of native coronary artery without angina pectoris: Secondary | ICD-10-CM | POA: Diagnosis not present

## 2016-10-24 DIAGNOSIS — Z8249 Family history of ischemic heart disease and other diseases of the circulatory system: Secondary | ICD-10-CM

## 2016-10-24 DIAGNOSIS — F17200 Nicotine dependence, unspecified, uncomplicated: Secondary | ICD-10-CM | POA: Diagnosis present

## 2016-10-24 DIAGNOSIS — E039 Hypothyroidism, unspecified: Secondary | ICD-10-CM | POA: Diagnosis present

## 2016-10-24 DIAGNOSIS — R05 Cough: Secondary | ICD-10-CM

## 2016-10-24 DIAGNOSIS — Z955 Presence of coronary angioplasty implant and graft: Secondary | ICD-10-CM

## 2016-10-24 DIAGNOSIS — I2102 ST elevation (STEMI) myocardial infarction involving left anterior descending coronary artery: Secondary | ICD-10-CM | POA: Diagnosis not present

## 2016-10-24 DIAGNOSIS — R079 Chest pain, unspecified: Secondary | ICD-10-CM

## 2016-10-24 DIAGNOSIS — Y848 Other medical procedures as the cause of abnormal reaction of the patient, or of later complication, without mention of misadventure at the time of the procedure: Secondary | ICD-10-CM | POA: Diagnosis present

## 2016-10-24 DIAGNOSIS — Z96611 Presence of right artificial shoulder joint: Secondary | ICD-10-CM | POA: Diagnosis present

## 2016-10-24 DIAGNOSIS — I110 Hypertensive heart disease with heart failure: Secondary | ICD-10-CM | POA: Diagnosis present

## 2016-10-24 DIAGNOSIS — S299XXA Unspecified injury of thorax, initial encounter: Secondary | ICD-10-CM | POA: Diagnosis present

## 2016-10-24 DIAGNOSIS — K219 Gastro-esophageal reflux disease without esophagitis: Secondary | ICD-10-CM | POA: Diagnosis present

## 2016-10-24 DIAGNOSIS — I5021 Acute systolic (congestive) heart failure: Secondary | ICD-10-CM | POA: Diagnosis present

## 2016-10-24 DIAGNOSIS — M545 Low back pain, unspecified: Secondary | ICD-10-CM | POA: Diagnosis present

## 2016-10-24 DIAGNOSIS — G4700 Insomnia, unspecified: Secondary | ICD-10-CM | POA: Diagnosis present

## 2016-10-24 DIAGNOSIS — I10 Essential (primary) hypertension: Secondary | ICD-10-CM | POA: Diagnosis present

## 2016-10-24 DIAGNOSIS — I469 Cardiac arrest, cause unspecified: Secondary | ICD-10-CM

## 2016-10-24 DIAGNOSIS — E785 Hyperlipidemia, unspecified: Secondary | ICD-10-CM | POA: Diagnosis present

## 2016-10-24 DIAGNOSIS — B1920 Unspecified viral hepatitis C without hepatic coma: Secondary | ICD-10-CM | POA: Diagnosis present

## 2016-10-24 DIAGNOSIS — J449 Chronic obstructive pulmonary disease, unspecified: Secondary | ICD-10-CM | POA: Diagnosis present

## 2016-10-24 DIAGNOSIS — R570 Cardiogenic shock: Secondary | ICD-10-CM

## 2016-10-24 DIAGNOSIS — R059 Cough, unspecified: Secondary | ICD-10-CM

## 2016-10-24 DIAGNOSIS — I462 Cardiac arrest due to underlying cardiac condition: Secondary | ICD-10-CM | POA: Diagnosis present

## 2016-10-24 DIAGNOSIS — Z79899 Other long term (current) drug therapy: Secondary | ICD-10-CM

## 2016-10-24 DIAGNOSIS — I2582 Chronic total occlusion of coronary artery: Secondary | ICD-10-CM | POA: Diagnosis present

## 2016-10-24 DIAGNOSIS — I4901 Ventricular fibrillation: Secondary | ICD-10-CM | POA: Diagnosis present

## 2016-10-24 DIAGNOSIS — Z87891 Personal history of nicotine dependence: Secondary | ICD-10-CM

## 2016-10-24 DIAGNOSIS — G8929 Other chronic pain: Secondary | ICD-10-CM | POA: Diagnosis present

## 2016-10-24 DIAGNOSIS — E876 Hypokalemia: Secondary | ICD-10-CM | POA: Diagnosis present

## 2016-10-24 DIAGNOSIS — B182 Chronic viral hepatitis C: Secondary | ICD-10-CM | POA: Diagnosis present

## 2016-10-24 HISTORY — DX: Cellulitis of right lower limb: L03.115

## 2016-10-24 HISTORY — PX: IABP INSERTION: CATH118242

## 2016-10-24 HISTORY — PX: LEFT HEART CATH AND CORONARY ANGIOGRAPHY: CATH118249

## 2016-10-24 HISTORY — DX: Cutaneous abscess of right lower limb: L02.415

## 2016-10-24 SURGERY — LEFT HEART CATH AND CORONARY ANGIOGRAPHY
Anesthesia: LOCAL

## 2016-10-24 MED ORDER — HEPARIN (PORCINE) IN NACL 2-0.9 UNIT/ML-% IJ SOLN
INTRAMUSCULAR | Status: AC | PRN
  Administered 2016-10-24: 1500 mL

## 2016-10-24 MED ORDER — BIVALIRUDIN TRIFLUOROACETATE 250 MG IV SOLR
INTRAVENOUS | Status: AC
  Filled 2016-10-24: qty 250

## 2016-10-24 MED ORDER — BIVALIRUDIN BOLUS VIA INFUSION - CUPID
INTRAVENOUS | Status: DC | PRN
  Administered 2016-10-24: 57 mg via INTRAVENOUS

## 2016-10-24 MED ORDER — SODIUM CHLORIDE 0.9 % IV SOLN
INTRAVENOUS | Status: DC | PRN
  Administered 2016-10-24: 1.75 mg/kg/h via INTRAVENOUS

## 2016-10-24 MED ORDER — ASPIRIN 81 MG PO CHEW
CHEWABLE_TABLET | ORAL | Status: DC | PRN
  Administered 2016-10-24: 324 mg via ORAL

## 2016-10-24 MED ORDER — LIDOCAINE HCL (PF) 1 % IJ SOLN
INTRAMUSCULAR | Status: DC | PRN
  Administered 2016-10-24: 45 mL

## 2016-10-24 MED ORDER — POTASSIUM CHLORIDE 10 MEQ/100ML IV SOLN
10.0000 meq | INTRAVENOUS | Status: AC
  Administered 2016-10-25 (×3): 10 meq via INTRAVENOUS
  Filled 2016-10-24 (×3): qty 100

## 2016-10-24 SURGICAL SUPPLY — 19 items
BALLN LINEAR 7.5FR IABP 34CC (BALLOONS) ×3
BALLN SAPPHIRE 2.0X12 (BALLOONS) ×3
BALLOON LINEAR 7.5FR IABP 34CC (BALLOONS) IMPLANT
BALLOON SAPPHIRE 2.0X12 (BALLOONS) ×1 IMPLANT
CATH INFINITI 5FR MULTPACK ANG (CATHETERS) ×2 IMPLANT
CATH LAUNCHER 6FR EBU3.5 (CATHETERS) ×2 IMPLANT
CATH VISTA GUIDE 6FR XB4 (CATHETERS) ×2 IMPLANT
DEVICE SECURE STATLOCK IABP (MISCELLANEOUS) ×4 IMPLANT
KIT HEART LEFT (KITS) ×3 IMPLANT
NEEDLE SMART REG 18GX2-3/4 (NEEDLE) ×3 IMPLANT
PACK CARDIAC CATHETERIZATION (CUSTOM PROCEDURE TRAY) ×3 IMPLANT
SHEATH PINNACLE 6F 10CM (SHEATH) ×4 IMPLANT
SYR MEDRAD MARK V 150ML (SYRINGE) ×4 IMPLANT
TRANSDUCER W/STOPCOCK (MISCELLANEOUS) ×3 IMPLANT
TUBING CIL FLEX 10 FLL-RA (TUBING) ×3 IMPLANT
TUBING CONTRAST HIGH PRESS 20 (MISCELLANEOUS) ×3 IMPLANT
WIRE ASAHI PROWATER 180CM (WIRE) ×3 IMPLANT
WIRE EMERALD 3MM-J .035X150CM (WIRE) ×4 IMPLANT
WIRE HI TORQ VERSACORE-J 145CM (WIRE) ×2 IMPLANT

## 2016-10-24 NOTE — H&P (Signed)
CARDIOLOGY INPATIENT HISTORY AND PHYSICAL EXAMINATION NOTE  Patient ID: Tommy Robbins MRN: [id number], DOB/AGE: 70-Apr-1947   Admit date: 10/24/2016   Primary Physician: Salley Scarlet, MD Primary Cardiologist: none  Reason for admission: VF arrest  HPI: This is a 71 y.o. black male without known CAD but h/o HTN, COPD, chronic Hepatitis C, GERD, chronic lower back pain and tobacco use 60 pack years presented with VF arrest around 2200 hrs today and post CPR ST elevation and chest pain.  Patient received 15 min CPR, 4 x shocks, no meds given. No aspirin given en route. Patient c/o CP post CPR. Central in location, 10/10 no radiation. No prior history of chest pain, PND, orthopnea, leg swelling, increased abdominal girth, syncope, presyncope, palpitations. Patient has Tiegs standing history of chronic back pain and remote history of IV drug use.  Patient underwent cardiac cath and demonstrated poor LAD target, mid LAD CTO, RCA CTO and OM1 dz. IABP was placed and patient was admitted to floor for further evaluation.    Problem List: Past Medical History:  Diagnosis Date  . Cataract   . Cellulitis and abscess of right leg 10/06/2016   right thigh  . Chronic low back pain   . Colon polyps   . Depression   . Diverticulosis   . GERD (gastroesophageal reflux disease)   . Hep C w/ coma, chronic (HCC)   . Hypertension   . Insomnia   . Thyroid disease    hypothyroid    Past Surgical History:  Procedure Laterality Date  . JOINT REPLACEMENT Right 03/23/2008   Shoulder- Replacement     Allergies: No Known Allergies   Home Medications Current Facility-Administered Medications  Medication Dose Route Frequency Provider Last Rate Last Dose  . 0.9 %  sodium chloride infusion   Intravenous Continuous Runell Gess, MD 75 mL/hr at 10/25/16 0100    . 0.9 %  sodium chloride infusion  250 mL Intravenous PRN Runell Gess, MD      . acetaminophen (TYLENOL) tablet 650 mg  650 mg Oral Q4H  PRN Runell Gess, MD      . aspirin chewable tablet 81 mg  81 mg Oral Daily Runell Gess, MD      . heparin ADULT infusion 100 units/mL (25000 units/270mL sodium chloride 0.45%)  1,000 Units/hr Intravenous Continuous Runell Gess, MD 10 mL/hr at 10/25/16 0200 1,000 Units/hr at 10/25/16 0200  . levothyroxine (SYNTHROID, LEVOTHROID) tablet 88 mcg  88 mcg Oral QAC breakfast Runell Gess, MD      . MEDLINE mouth rinse  15 mL Mouth Rinse BID Timoteo Expose T, MD   15 mL at 10/25/16 0230  . morphine 4 MG/ML injection 2 mg  2 mg Intravenous Q4H PRN Timoteo Expose T, MD   2 mg at 10/25/16 0335  . ondansetron (ZOFRAN) injection 4 mg  4 mg Intravenous Q6H PRN Runell Gess, MD      . pantoprazole (PROTONIX) EC tablet 40 mg  40 mg Oral Daily Runell Gess, MD      . Melene Muller ON 10/26/2016] pneumococcal 23 valent vaccine (PNU-IMMUNE) injection 0.5 mL  0.5 mL Intramuscular Tomorrow-1000 Timoteo Expose T, MD      . potassium chloride 10 mEq in 100 mL IVPB  10 mEq Intravenous Q1 Hr x 3 Virgina Organ, Waqas T, MD 100 mL/hr at 10/25/16 0643 10 mEq at 10/25/16 0643  . sodium chloride flush (NS) 0.9 % injection 3 mL  3 mL  Intravenous Q12H Runell Gess, MD   10 mL at 10/25/16 0200  . sodium chloride flush (NS) 0.9 % injection 3 mL  3 mL Intravenous PRN Runell Gess, MD         Family History  Problem Relation Age of Onset  . Arthritis Father        rheumatoid  . Hypertension Sister   . Arthritis Sister      Social History   Social History  . Marital status: Divorced    Spouse name: N/A  . Number of children: N/A  . Years of education: N/A   Occupational History  . Not on file.   Social History Main Topics  . Smoking status: Former Smoker    Packs/day: 1.00    Years: 50.00    Types: Cigarettes    Quit date: 06/10/2011  . Smokeless tobacco: Never Used  . Alcohol use Yes  . Drug use: No  . Sexual activity: Not on file   Other Topics Concern  . Not on file   Social  History Narrative   Entered 12/2013:   Lives with his Sister, Niece, and Mother   They take turns caring for mother, who is 82   Quit smoking in 2013     Review of Systems: General: negative for chills, fever, night sweats or weight changes.  Cardiovascular: chest pain, dyspnea  Dermatological: negative for rash Respiratory: negative for cough or wheezing Urologic: negative for hematuria Abdominal: negative for nausea, vomiting, diarrhea, bright red blood per rectum, melena, or hematemesis Neurologic: negative for visual changes, syncope, or dizziness Endocrine: no diabetes, no hypothyroidism Immunological: no lymph adenopathy Psych: non homicidal/suicidal  Physical Exam: Vitals: BP 80/59 HR 120 RR 30 O2 sats 97% on 2l [place] [facility]: writhering in pain Neck: JVP flat, neck supple Heart: regular rate and rhythm, S1, S2, no murmurs  Lungs: crackles coarse bilaterally GI: non tender, non distended, bowel sounds present Extremities: no edema Neuro: AAO x 3  Psych: normal affect, no anxiety   Labs:   Results for orders placed or performed during the hospital encounter of 10/24/16 (from the past 24 hour(s))  I-STAT, chem 8     Status: Abnormal   Collection Time: 10/24/16 11:42 PM  Result Value Ref Range   Sodium 141 135 - 145 mmol/L   Potassium 2.4 (LL) 3.5 - 5.1 mmol/L   Chloride 102 101 - 111 mmol/L   BUN 15 6 - 20 mg/dL   Creatinine, Ser 1.61 0.61 - 1.24 mg/dL   Glucose, Bld 096 (H) 65 - 99 mg/dL   Calcium, Ion 0.45 (L) 1.15 - 1.40 mmol/L   TCO2 16 0 - 100 mmol/L   Hemoglobin 12.2 (L) 13.0 - 17.0 g/dL   HCT 40.9 (L) 81.1 - 91.4 %   Comment NOTIFIED PHYSICIAN   POCT Activated clotting time     Status: None   Collection Time: 10/24/16 11:46 PM  Result Value Ref Range   Activated Clotting Time 411 seconds  MRSA PCR Screening     Status: None   Collection Time: 10/25/16  1:00 AM  Result Value Ref Range   MRSA by PCR NEGATIVE NEGATIVE  Troponin I (q 6hr x 3)     Status:  Abnormal   Collection Time: 10/25/16  1:04 AM  Result Value Ref Range   Troponin I 2.67 (HH) <0.03 ng/mL  Troponin I (q 6hr x 3)     Status: Abnormal   Collection Time: 10/25/16  5:12 AM  Result Value Ref Range   Troponin I 11.45 (HH) <0.03 ng/mL  Basic metabolic panel     Status: Abnormal   Collection Time: 10/25/16  5:12 AM  Result Value Ref Range   Sodium 136 135 - 145 mmol/L   Potassium 3.4 (L) 3.5 - 5.1 mmol/L   Chloride 105 101 - 111 mmol/L   CO2 24 22 - 32 mmol/L   Glucose, Bld 117 (H) 65 - 99 mg/dL   BUN 18 6 - 20 mg/dL   Creatinine, Ser 1.61 0.61 - 1.24 mg/dL   Calcium 8.4 (L) 8.9 - 10.3 mg/dL   GFR calc non Af Amer >60 >60 mL/min   GFR calc Af Amer >60 >60 mL/min   Anion gap 7 5 - 15  CBC     Status: Abnormal   Collection Time: 10/25/16  5:12 AM  Result Value Ref Range   WBC 8.7 4.0 - 10.5 K/uL   RBC 4.29 4.22 - 5.81 MIL/uL   Hemoglobin 13.0 13.0 - 17.0 g/dL   HCT 09.6 (L) 04.5 - 40.9 %   MCV 85.1 78.0 - 100.0 fL   MCH 30.3 26.0 - 34.0 pg   MCHC 35.6 30.0 - 36.0 g/dL   RDW 81.1 91.4 - 78.2 %   Platelets 154 150 - 400 K/uL     Radiology/Studies: No results found.  EKG: normal sinus rhythm with anterolateral ST elevation  Echo: pending  Cardiac cath: pending  Medical decision making:  Discussed care with the patient Reviewed labs and imaging personally Reviewed prior records  ASSESSMENT AND PLAN:  This is a 71 y.o. male without known CAD w/history of HTN and tobacco use presented with VF arrest around 2200 hrs with ROSC ~17 min and c/o CP post CPR. ECG demonstrated anterior ST elevation.     Active Problems:   Hypothyroidism   DISORDER, TOBACCO USE   HYPERTENSION, BENIGN ESSENTIAL   COPD (chronic obstructive pulmonary disease) (HCC)   Hepatitis C   GERD (gastroesophageal reflux disease)   Chronic low back pain   Acute ST elevation myocardial infarction (STEMI) involving left anterior descending (LAD) coronary artery (HCC)   Anteriolateral STEMI  s/p cardiac arrest 17 min CPR Emergent cardiac catheterization and PCI demonstrated  CTO of mid LAD, CTO of RCA and OM1 dz with moderate dz of Lcx. IABP was placed with augmented bp 107/93. Risk stratify with HbA1c, lipid profile, TSH Echocardiogram in the morning Aspirin, high dose statin and beta blocker if tolerated Tobacco cessation counseling provided Medication compliance emphasized to the patient  Tobacco abuse  - smoking cessation advised  COPD, not in exacerbation - continue home meds  GERD - continue PPI  Hypothyroidism - check TSH  Hypertension, essential - continue home meds  Chronic back pain - prn meds   Signed, Joellyn Rued, MD MS 10/25/2016, 7:20 AM

## 2016-10-25 ENCOUNTER — Inpatient Hospital Stay (HOSPITAL_COMMUNITY): Payer: BC Managed Care – PPO

## 2016-10-25 ENCOUNTER — Encounter (HOSPITAL_COMMUNITY): Payer: Self-pay | Admitting: *Deleted

## 2016-10-25 DIAGNOSIS — K219 Gastro-esophageal reflux disease without esophagitis: Secondary | ICD-10-CM | POA: Diagnosis present

## 2016-10-25 DIAGNOSIS — I2102 ST elevation (STEMI) myocardial infarction involving left anterior descending coronary artery: Secondary | ICD-10-CM | POA: Diagnosis present

## 2016-10-25 DIAGNOSIS — I2510 Atherosclerotic heart disease of native coronary artery without angina pectoris: Secondary | ICD-10-CM | POA: Diagnosis present

## 2016-10-25 DIAGNOSIS — E876 Hypokalemia: Secondary | ICD-10-CM | POA: Diagnosis present

## 2016-10-25 DIAGNOSIS — Z87891 Personal history of nicotine dependence: Secondary | ICD-10-CM | POA: Diagnosis not present

## 2016-10-25 DIAGNOSIS — E785 Hyperlipidemia, unspecified: Secondary | ICD-10-CM | POA: Diagnosis present

## 2016-10-25 DIAGNOSIS — Z96611 Presence of right artificial shoulder joint: Secondary | ICD-10-CM | POA: Diagnosis present

## 2016-10-25 DIAGNOSIS — I110 Hypertensive heart disease with heart failure: Secondary | ICD-10-CM | POA: Diagnosis present

## 2016-10-25 DIAGNOSIS — F17200 Nicotine dependence, unspecified, uncomplicated: Secondary | ICD-10-CM | POA: Diagnosis not present

## 2016-10-25 DIAGNOSIS — I462 Cardiac arrest due to underlying cardiac condition: Secondary | ICD-10-CM | POA: Diagnosis present

## 2016-10-25 DIAGNOSIS — I361 Nonrheumatic tricuspid (valve) insufficiency: Secondary | ICD-10-CM

## 2016-10-25 DIAGNOSIS — Z79899 Other long term (current) drug therapy: Secondary | ICD-10-CM | POA: Diagnosis not present

## 2016-10-25 DIAGNOSIS — Y848 Other medical procedures as the cause of abnormal reaction of the patient, or of later complication, without mention of misadventure at the time of the procedure: Secondary | ICD-10-CM | POA: Diagnosis present

## 2016-10-25 DIAGNOSIS — Z8674 Personal history of sudden cardiac arrest: Secondary | ICD-10-CM

## 2016-10-25 DIAGNOSIS — G4700 Insomnia, unspecified: Secondary | ICD-10-CM | POA: Diagnosis present

## 2016-10-25 DIAGNOSIS — S299XXA Unspecified injury of thorax, initial encounter: Secondary | ICD-10-CM | POA: Diagnosis present

## 2016-10-25 DIAGNOSIS — I4901 Ventricular fibrillation: Secondary | ICD-10-CM | POA: Diagnosis present

## 2016-10-25 DIAGNOSIS — J449 Chronic obstructive pulmonary disease, unspecified: Secondary | ICD-10-CM | POA: Diagnosis present

## 2016-10-25 DIAGNOSIS — E039 Hypothyroidism, unspecified: Secondary | ICD-10-CM | POA: Diagnosis present

## 2016-10-25 DIAGNOSIS — J439 Emphysema, unspecified: Secondary | ICD-10-CM | POA: Diagnosis not present

## 2016-10-25 DIAGNOSIS — B182 Chronic viral hepatitis C: Secondary | ICD-10-CM | POA: Diagnosis present

## 2016-10-25 DIAGNOSIS — I10 Essential (primary) hypertension: Secondary | ICD-10-CM | POA: Diagnosis not present

## 2016-10-25 DIAGNOSIS — I2582 Chronic total occlusion of coronary artery: Secondary | ICD-10-CM | POA: Diagnosis present

## 2016-10-25 DIAGNOSIS — G8929 Other chronic pain: Secondary | ICD-10-CM | POA: Diagnosis present

## 2016-10-25 DIAGNOSIS — Z8249 Family history of ischemic heart disease and other diseases of the circulatory system: Secondary | ICD-10-CM | POA: Diagnosis not present

## 2016-10-25 DIAGNOSIS — M545 Low back pain: Secondary | ICD-10-CM | POA: Diagnosis present

## 2016-10-25 DIAGNOSIS — I5021 Acute systolic (congestive) heart failure: Secondary | ICD-10-CM | POA: Diagnosis present

## 2016-10-25 HISTORY — DX: Personal history of sudden cardiac arrest: Z86.74

## 2016-10-25 LAB — CBC
HEMATOCRIT: 36.5 % — AB (ref 39.0–52.0)
Hemoglobin: 13 g/dL (ref 13.0–17.0)
MCH: 30.3 pg (ref 26.0–34.0)
MCHC: 35.6 g/dL (ref 30.0–36.0)
MCV: 85.1 fL (ref 78.0–100.0)
Platelets: 154 10*3/uL (ref 150–400)
RBC: 4.29 MIL/uL (ref 4.22–5.81)
RDW: 12.5 % (ref 11.5–15.5)
WBC: 8.7 10*3/uL (ref 4.0–10.5)

## 2016-10-25 LAB — POCT I-STAT, CHEM 8
BUN: 15 mg/dL (ref 6–20)
Calcium, Ion: 0.92 mmol/L — ABNORMAL LOW (ref 1.15–1.40)
Chloride: 102 mmol/L (ref 101–111)
Creatinine, Ser: 0.9 mg/dL (ref 0.61–1.24)
GLUCOSE: 213 mg/dL — AB (ref 65–99)
HCT: 36 % — ABNORMAL LOW (ref 39.0–52.0)
HEMOGLOBIN: 12.2 g/dL — AB (ref 13.0–17.0)
POTASSIUM: 2.4 mmol/L — AB (ref 3.5–5.1)
SODIUM: 141 mmol/L (ref 135–145)
TCO2: 16 mmol/L (ref 0–100)

## 2016-10-25 LAB — BASIC METABOLIC PANEL
Anion gap: 7 (ref 5–15)
Anion gap: 9 (ref 5–15)
BUN: 18 mg/dL (ref 6–20)
BUN: 16 mg/dL (ref 6–20)
CALCIUM: 8.2 mg/dL — AB (ref 8.9–10.3)
CHLORIDE: 103 mmol/L (ref 101–111)
CO2: 24 mmol/L (ref 22–32)
CO2: 22 mmol/L (ref 22–32)
CREATININE: 1.01 mg/dL (ref 0.61–1.24)
Calcium: 8.4 mg/dL — ABNORMAL LOW (ref 8.9–10.3)
Chloride: 105 mmol/L (ref 101–111)
Creatinine, Ser: 1.14 mg/dL (ref 0.61–1.24)
GFR calc Af Amer: >60 mL/min (ref >60)
GFR calc Af Amer: >60 mL/min (ref >60)
GFR calc non Af Amer: >60 mL/min (ref >60)
GLUCOSE: 117 mg/dL — AB (ref 65–99)
Glucose, Bld: 157 mg/dL — ABNORMAL HIGH (ref 65–99)
POTASSIUM: 3.4 mmol/L — AB (ref 3.5–5.1)
Potassium: 3.4 mmol/L — ABNORMAL LOW (ref 3.5–5.1)
Sodium: 136 mmol/L (ref 135–145)
Sodium: 134 mmol/L — ABNORMAL LOW (ref 135–145)

## 2016-10-25 LAB — TROPONIN I
TROPONIN I: 11.45 ng/mL — AB (ref <0.03)
TROPONIN I: 17.76 ng/mL — AB (ref <0.03)
Troponin I: 2.67 ng/mL (ref <0.03)

## 2016-10-25 LAB — HEPARIN LEVEL (UNFRACTIONATED): Heparin Unfractionated: 0.24 IU/mL — ABNORMAL LOW (ref 0.30–0.70)

## 2016-10-25 LAB — MRSA PCR SCREENING: MRSA by PCR: NEGATIVE

## 2016-10-25 LAB — ECHOCARDIOGRAM COMPLETE
HEIGHTINCHES: 70 in
Weight: 2578.5 oz

## 2016-10-25 LAB — POCT ACTIVATED CLOTTING TIME: ACTIVATED CLOTTING TIME: 411 s

## 2016-10-25 MED ORDER — LABETALOL HCL 5 MG/ML IV SOLN: 10.0000 mg | INTRAVENOUS | Status: AC | PRN

## 2016-10-25 MED ORDER — SODIUM CHLORIDE 0.9% FLUSH
3.0000 mL | Freq: Two times a day (BID) | INTRAVENOUS | Status: DC
  Administered 2016-10-25: 10 mL via INTRAVENOUS
  Administered 2016-10-26: 3 mL via INTRAVENOUS
  Administered 2016-10-26: 10 mL via INTRAVENOUS
  Administered 2016-10-27 – 2016-10-28 (×4): 3 mL via INTRAVENOUS

## 2016-10-25 MED ORDER — POTASSIUM CHLORIDE CRYS ER 20 MEQ PO TBCR
40.0000 meq | EXTENDED_RELEASE_TABLET | Freq: Two times a day (BID) | ORAL | Status: AC
  Administered 2016-10-25 (×2): 40 meq via ORAL
  Filled 2016-10-25 (×2): qty 2

## 2016-10-25 MED ORDER — POTASSIUM CHLORIDE CRYS ER 20 MEQ PO TBCR: 40.0000 meq | EXTENDED_RELEASE_TABLET | Freq: Once | ORAL | Status: DC

## 2016-10-25 MED ORDER — LIDOCAINE HCL (PF) 1 % IJ SOLN
INTRAMUSCULAR | Status: AC
  Filled 2016-10-25: qty 60

## 2016-10-25 MED ORDER — NITROGLYCERIN 1 MG/10 ML FOR IR/CATH LAB
INTRA_ARTERIAL | Status: AC
  Filled 2016-10-25: qty 10

## 2016-10-25 MED ORDER — ORAL CARE MOUTH RINSE
15.0000 mL | Freq: Two times a day (BID) | OROMUCOSAL | Status: DC
  Administered 2016-10-25 – 2016-10-27 (×6): 15 mL via OROMUCOSAL

## 2016-10-25 MED ORDER — ONDANSETRON HCL 4 MG/2ML IJ SOLN: 4.0000 mg | Freq: Four times a day (QID) | INTRAMUSCULAR | Status: DC | PRN

## 2016-10-25 MED ORDER — SODIUM CHLORIDE 0.9 % IV SOLN
INTRAVENOUS | Status: AC | PRN
Start: 2016-10-25 — End: 2016-10-25
  Administered 2016-10-25: 10 mL/h via INTRAVENOUS

## 2016-10-25 MED ORDER — ASPIRIN 81 MG PO CHEW
81.0000 mg | CHEWABLE_TABLET | Freq: Every day | ORAL | Status: DC
  Administered 2016-10-25 – 2016-10-29 (×5): 81 mg via ORAL
  Filled 2016-10-25 (×5): qty 1

## 2016-10-25 MED ORDER — ASPIRIN 81 MG PO CHEW
CHEWABLE_TABLET | ORAL | Status: AC
  Filled 2016-10-25: qty 4

## 2016-10-25 MED ORDER — IOPAMIDOL (ISOVUE-370) INJECTION 76%
INTRAVENOUS | Status: AC
  Filled 2016-10-25: qty 100

## 2016-10-25 MED ORDER — SODIUM CHLORIDE 0.9 % IV SOLN
INTRAVENOUS | Status: AC | PRN
  Administered 2016-10-25: 100 mL/h via INTRAVENOUS

## 2016-10-25 MED ORDER — IOPAMIDOL (ISOVUE-370) INJECTION 76%
INTRAVENOUS | Status: AC
  Filled 2016-10-25: qty 125

## 2016-10-25 MED ORDER — HYDRALAZINE HCL 20 MG/ML IJ SOLN: 5.0000 mg | INTRAMUSCULAR | Status: AC | PRN

## 2016-10-25 MED ORDER — POTASSIUM CHLORIDE 10 MEQ/100ML IV SOLN
10.0000 meq | INTRAVENOUS | Status: AC
  Administered 2016-10-25: 10 meq via INTRAVENOUS
  Filled 2016-10-25 (×3): qty 100

## 2016-10-25 MED ORDER — MORPHINE SULFATE (PF) 4 MG/ML IV SOLN
2.0000 mg | INTRAVENOUS | Status: DC | PRN
  Administered 2016-10-25: 2 mg via INTRAVENOUS
  Administered 2016-10-25 – 2016-10-28 (×9): 4 mg via INTRAVENOUS
  Administered 2016-10-28 – 2016-10-29 (×2): 2 mg via INTRAVENOUS
  Administered 2016-10-29: 4 mg via INTRAVENOUS
  Filled 2016-10-25 (×13): qty 1

## 2016-10-25 MED ORDER — PNEUMOCOCCAL VAC POLYVALENT 25 MCG/0.5ML IJ INJ
0.5000 mL | INJECTION | INTRAMUSCULAR | Status: AC
  Administered 2016-10-29: 0.5 mL via INTRAMUSCULAR
  Filled 2016-10-25: qty 0.5

## 2016-10-25 MED ORDER — SODIUM CHLORIDE 0.9 % IV SOLN
250.0000 mL | INTRAVENOUS | Status: DC | PRN
  Administered 2016-10-25: 1000 mL via INTRAVENOUS

## 2016-10-25 MED ORDER — SODIUM CHLORIDE 0.9 % IV SOLN
INTRAVENOUS | Status: AC
  Administered 2016-10-25 (×2): via INTRAVENOUS

## 2016-10-25 MED ORDER — LEVOTHYROXINE SODIUM 88 MCG PO TABS
88.0000 ug | ORAL_TABLET | Freq: Every day | ORAL | Status: DC
  Administered 2016-10-25 – 2016-10-28 (×4): 88 ug via ORAL
  Filled 2016-10-25 (×4): qty 1

## 2016-10-25 MED ORDER — ACETAMINOPHEN 325 MG PO TABS
650.0000 mg | ORAL_TABLET | ORAL | Status: DC | PRN
  Administered 2016-10-25 – 2016-10-26 (×2): 650 mg via ORAL
  Filled 2016-10-25 (×2): qty 2

## 2016-10-25 MED ORDER — HEPARIN (PORCINE) IN NACL 2-0.9 UNIT/ML-% IJ SOLN
INTRAMUSCULAR | Status: AC
  Filled 2016-10-25: qty 1500

## 2016-10-25 MED ORDER — LIDOCAINE 5 % EX PTCH
1.0000 | MEDICATED_PATCH | CUTANEOUS | Status: DC
  Administered 2016-10-25 – 2016-10-29 (×5): 1 via TRANSDERMAL
  Filled 2016-10-25 (×6): qty 1

## 2016-10-25 MED ORDER — PANTOPRAZOLE SODIUM 40 MG PO TBEC
40.0000 mg | DELAYED_RELEASE_TABLET | Freq: Every day | ORAL | Status: DC
  Administered 2016-10-25 – 2016-10-29 (×5): 40 mg via ORAL
  Filled 2016-10-25 (×5): qty 1

## 2016-10-25 MED ORDER — MORPHINE SULFATE (PF) 4 MG/ML IV SOLN
2.0000 mg | INTRAVENOUS | Status: DC | PRN
  Administered 2016-10-25: 2 mg via INTRAVENOUS
  Filled 2016-10-25: qty 1

## 2016-10-25 MED ORDER — HEPARIN (PORCINE) IN NACL 100-0.45 UNIT/ML-% IJ SOLN
1150.0000 [IU]/h | INTRAMUSCULAR | Status: DC
  Administered 2016-10-25: 1150 [IU]/h via INTRAVENOUS
  Administered 2016-10-25: 1000 [IU]/h via INTRAVENOUS
  Filled 2016-10-25 (×3): qty 250

## 2016-10-25 MED ORDER — POTASSIUM CHLORIDE 10 MEQ/100ML IV SOLN
INTRAVENOUS | Status: AC | PRN
  Administered 2016-10-25: 10 meq via INTRAVENOUS

## 2016-10-25 MED ORDER — SODIUM CHLORIDE 0.9% FLUSH: 3.0000 mL | INTRAVENOUS | Status: DC | PRN

## 2016-10-25 NOTE — Progress Notes (Addendum)
ANTICOAGULATION CONSULT NOTE - Initial Consult  Pharmacy Consult for Heparin Indication: chest pain/ACS  No Known Allergies  Patient Measurements:   Heparin Dosing Weight: 70 kg  Vital Signs: BP: 110/66 (08/05 0029) Pulse Rate: 102 (08/05 0034)  Labs: No results for input(s): HGB, HCT, PLT, APTT, LABPROT, INR, HEPARINUNFRC, HEPRLOWMOCWT, CREATININE, CKTOTAL, CKMB, TROPONINI in the last 72 hours.  CrCl cannot be calculated (Patient's most recent lab result is older than the maximum 21 days allowed.).   Medical History: Past Medical History:  Diagnosis Date  . Cataract   . Chronic low back pain   . Colon polyps   . Depression   . Diverticulosis   . GERD (gastroesophageal reflux disease)   . Hep C w/ coma, chronic (HCC)   . Hypertension   . Insomnia   . Thyroid disease    hypothyroid    Medications:  No current facility-administered medications on file prior to encounter.    Current Outpatient Prescriptions on File Prior to Encounter  Medication Sig Dispense Refill  . amLODipine (NORVASC) 10 MG tablet Take 1 tablet (10 mg total) by mouth daily. 90 tablet 0  . doxycycline (VIBRA-TABS) 100 MG tablet Take 1 tablet (100 mg total) by mouth 2 (two) times daily. 14 tablet 0  . gabapentin (NEURONTIN) 100 MG capsule TAKE 1 TO 2 CAPSULES BY MOUTH AT BEDTIME  3  . levothyroxine (SYNTHROID, LEVOTHROID) 88 MCG tablet TAKE 1 TABLET DAILY BEFORE BREAKFAST 90 tablet 2  . lidocaine (LIDODERM) 5 % USE 3 PATCHES ON THE SKIN DAILY  4  . lisinopril-hydrochlorothiazide (PRINZIDE,ZESTORETIC) 20-12.5 MG tablet Take 2 tablets by mouth daily. 180 tablet 0  . morphine (MS CONTIN) 15 MG 12 hr tablet Take 15 mg by mouth every morning. With 30 mg dose to equal 45 mg each morning  0  . morphine (MS CONTIN) 30 MG 12 hr tablet Take 30 mg by mouth every 8 (eight) hours. Takes 1 every 8 hrs TID    . Nutritional Supplements (JUICE PLUS FIBRE PO) Take 2 capsules by mouth 2 (two) times daily.    .  pantoprazole (PROTONIX) 40 MG tablet TAKE 1 TABLET DAILY 30 tablet 0  . potassium chloride (KLOR-CON M10) 10 MEQ tablet Take 1 tablet (10 mEq total) by mouth daily. 90 tablet 1  . sulfamethoxazole-trimethoprim (BACTRIM DS,SEPTRA DS) 800-160 MG tablet Take 1 tablet by mouth 2 (two) times daily. 20 tablet 0  . temazepam (RESTORIL) 15 MG capsule Take 1 capsule (15 mg total) by mouth at bedtime as needed for sleep. 45 capsule 0  . traZODone (DESYREL) 50 MG tablet Take 25 mg by mouth at bedtime.  2  . varenicline (CHANTIX) 1 MG tablet Take 1 tablet (1 mg total) by mouth 2 (two) times daily. 60 tablet 5    Assessment: 71 y.o. male with STEMI s/p cath, on IABP, for heparin.  Angiomax given during procedure.    Goal of Therapy:  Heparin level 0.3-0.7 Monitor platelets by anticoagulation protocol: Yes   Plan:  Start heparin 1000 units/hr Check heparin level in 8 hours.   Eddie Candle 10/25/2016,12:48 AM

## 2016-10-25 NOTE — Progress Notes (Addendum)
Progress Note  Patient Name: Tommy Robbins Date of Encounter: 10/25/2016  Primary Cardiologist: Dr. Allyson Sabal  Subjective   Still having severe chest pain.  Of note, he is on chronic pain meds including MS Contin, Lidoderm patch.  Inpatient Medications    Scheduled Meds: . aspirin  81 mg Oral Daily  . levothyroxine  88 mcg Oral QAC breakfast  . mouth rinse  15 mL Mouth Rinse BID  . pantoprazole  40 mg Oral Daily  . [START ON 10/26/2016] pneumococcal 23 valent vaccine  0.5 mL Intramuscular Tomorrow-1000  . sodium chloride flush  3 mL Intravenous Q12H   Continuous Infusions: . sodium chloride 75 mL/hr at 10/25/16 0100  . sodium chloride    . heparin 1,000 Units/hr (10/25/16 0200)   PRN Meds: sodium chloride, acetaminophen, morphine injection, ondansetron (ZOFRAN) IV, sodium chloride flush   Vital Signs    Vitals:   10/25/16 0400 10/25/16 0500 10/25/16 0600 10/25/16 0700  BP: 104/81 97/74 102/71 97/67  Pulse: (!) 113 (!) 114 (!) 136 (!) 129  Resp: 16 17 17 12   Temp:      TempSrc:      SpO2: 96% 97% 96% 97%  Weight:      Height:        Intake/Output Summary (Last 24 hours) at 10/25/16 0801 Last data filed at 10/25/16 0700  Gross per 24 hour  Intake             1150 ml  Output              900 ml  Net              250 ml   Filed Weights   10/25/16 0115  Weight: 161 lb 2.5 oz (73.1 kg)    Telemetry    Prior AFib, now NSR - Personally Reviewed  ECG    AFib with RVR - Personally Reviewed  Physical Exam   GEN: No acute distress.   Neck: No JVD Cardiac: RRR, no murmurs, rubs, or gallops.  Respiratory: Clear to auscultation bilaterally. GI: Soft, nontender, non-distended  MS: No edema; No deformity.Left groin with IABP and venous sheath.  No hematoma. 2+ left PT pulse.  1+ right PT pulse Neuro:  Nonfocal  Psych: Normal affect   Labs    Chemistry Recent Labs Lab 10/24/16 2342 10/25/16 0512  NA 141 136  K 2.4* 3.4*  CL 102 105  CO2  --  24  GLUCOSE  213* 117*  BUN 15 18  CREATININE 0.90 1.14  CALCIUM  --  8.4*  GFRNONAA  --  >60  GFRAA  --  >60  ANIONGAP  --  7     Hematology Recent Labs Lab 10/24/16 2342 10/25/16 0512  WBC  --  8.7  RBC  --  4.29  HGB 12.2* 13.0  HCT 36.0* 36.5*  MCV  --  85.1  MCH  --  30.3  MCHC  --  35.6  RDW  --  12.5  PLT  --  154    Cardiac Enzymes Recent Labs Lab 10/25/16 0104 10/25/16 0512  TROPONINI 2.67* 11.45*   No results for input(s): TROPIPOC in the last 168 hours.   BNPNo results for input(s): BNP, PROBNP in the last 168 hours.   DDimer No results for input(s): DDIMER in the last 168 hours.   Radiology    No results found.  Cardiac Studies   Cath films personally reviewed: LAD CTO in the mid  vessel.  Faint right to left collaterals  Patient Profile     71 y.o. male with acute anterior MI  Assessment & Plan    1) CAD/MI: Persistent chest pain.  Support with pain meds and IABP.  Discussed case and reviewed films with CT surgery.  No target in LAD for CABG.  Left PDA is a poor target as well for CABG.  Medical therapy will be the only option.  Morphine frequency increased given that he takes MS Contin at home.  Start clopidogrel.  Will give 300 mg dose at this time.  Continue IABP today.  Venous sheath should be removed tomorrow.  2) Hypokalemia: Give oral potassium now.    3) Lidoderm patch ordered as well.    Critical care time 50 minutes.  Signed, Lance Muss, MD  10/25/2016, 8:01 AM

## 2016-10-25 NOTE — Plan of Care (Signed)
Problem: Physical Regulation: Goal: Ability to maintain clinical measurements within normal limits will improve Outcome: Progressing Pt converted from afib back to SR Goal: Will remain free from infection Outcome: Progressing Pt given IS and instructed on pulmonary toilet due to productive cough, recent rib fx following fall at home several weeks ago, and bedrest while on IABP  Problem: Nutrition: Goal: Adequate nutrition will be maintained Outcome: Progressing Pt with decreased appetite, pt willing to eat some of each meal, discussed other options, family to bring in his normal breakfast shake tomorrow  Problem: Bowel/Gastric: Goal: Will not experience complications related to bowel motility Outcome: Progressing Pt with several formed BMs today

## 2016-10-25 NOTE — Progress Notes (Signed)
ANTICOAGULATION CONSULT NOTE  Pharmacy Consult for Heparin Indication: chest pain/ACS  No Known Allergies  Patient Measurements: Height: 5\' 10"  (177.8 cm) Weight: 161 lb 2.5 oz (73.1 kg) IBW/kg (Calculated) : 73 Heparin Dosing Weight: 70 kg  Vital Signs: Temp: 98.1 F (36.7 C) (08/05 1100) Temp Source: Oral (08/05 1100) BP: 102/73 (08/05 1300) Pulse Rate: 72 (08/05 1300)  Labs:  Recent Labs  10/24/16 2342 10/25/16 0104 10/25/16 0512 10/25/16 1140 10/25/16 1241  HGB 12.2*  --  13.0  --   --   HCT 36.0*  --  36.5*  --   --   PLT  --   --  154  --   --   HEPARINUNFRC  --   --   --   --  0.24*  CREATININE 0.90  --  1.14 1.01  --   TROPONINI  --  2.67* 11.45* 17.76*  --     Estimated Creatinine Clearance: 70.3 mL/min (by C-G formula based on SCr of 1.01 mg/dL).   Medical History: Past Medical History:  Diagnosis Date  . Cataract   . Cellulitis and abscess of right leg 10/06/2016   right thigh  . Chronic low back pain   . Colon polyps   . Depression   . Diverticulosis   . GERD (gastroesophageal reflux disease)   . Hep C w/ coma, chronic (HCC)   . Hypertension   . Insomnia   . Thyroid disease    hypothyroid    Assessment: 71 y.o. male with STEMI s/p cath, on IABP, for heparin.   Patient remains on IABP today, he was loaded with clopidogrel this morning. His initial heparin level is just below desired goal at 0.24, no bleeding issues noted, cbc stable.  Shooting for higher goal than usual within the first 48 hours d/t no intervention able to be performed. Will consider reducing anti-xa level goal to 0.2-0.5 after another 24 hours. Goal of Therapy:  Heparin level 0.3-0.7 Monitor platelets by anticoagulation protocol: Yes   Plan:  Increase heparin to 1150 units/hr Daily heparin level and cbc  Sheppard Coil PharmD., BCPS Clinical Pharmacist Pager [phone] 10/25/2016 1:52 PM

## 2016-10-25 NOTE — Progress Notes (Deleted)
ANTICOAGULATION CONSULT NOTE  Pharmacy Consult for Heparin Indication: chest pain/ACS  No Known Allergies  Patient Measurements: Height: 5\' 10"  (177.8 cm) Weight: 161 lb 2.5 oz (73.1 kg) IBW/kg (Calculated) : 73 Heparin Dosing Weight: 70 kg  Vital Signs: Temp: 98.1 F (36.7 C) (08/05 1100) Temp Source: Oral (08/05 1100) BP: 102/69 (08/05 1100) Pulse Rate: 85 (08/05 1100)  Labs:  Recent Labs  10/24/16 2342 10/25/16 0104 10/25/16 0512  HGB 12.2*  --  13.0  HCT 36.0*  --  36.5*  PLT  --   --  154  CREATININE 0.90  --  1.14  TROPONINI  --  2.67* 11.45*    Estimated Creatinine Clearance: 62.3 mL/min (by C-G formula based on SCr of 1.14 mg/dL).   Medical History: Past Medical History:  Diagnosis Date  . Cataract   . Cellulitis and abscess of right leg 10/06/2016   right thigh  . Chronic low back pain   . Colon polyps   . Depression   . Diverticulosis   . GERD (gastroesophageal reflux disease)   . Hep C w/ coma, chronic (HCC)   . Hypertension   . Insomnia   . Thyroid disease    hypothyroid    Assessment: 71 y.o. male with STEMI s/p cath, on IABP, for heparin.   Patient remains on IABP today, he was loaded with clopidogrel this morning. His initial heparin level is at goal, no bleeding issues noted, cbc stable.  Goal of Therapy:  Heparin level 0.2-0.5 Monitor platelets by anticoagulation protocol: Yes   Plan:  Continue heparin at 1000 units/hr Daily heparin level and cbc  Sheppard Coil PharmD., BCPS Clinical Pharmacist Pager [phone] 10/25/2016 12:16 PM

## 2016-10-25 NOTE — Progress Notes (Signed)
  Echocardiogram 2D Echocardiogram has been performed.  Leta Jungling M 10/25/2016, 10:14 AM

## 2016-10-25 NOTE — Plan of Care (Signed)
Problem: Cardiovascular: Goal: Ability to achieve and maintain adequate cardiovascular perfusion will improve Outcome: Progressing Pt more stable tonight, remains on IABP  Goal: Vascular access site(s) Level 0-1 will be maintained Outcome: Progressing Left groin level 0   Problem: Education: Goal: Understanding of CV disease, CV risk reduction, and recovery process will improve Outcome: Progressing Pt is actively asking questions and seeking to understand

## 2016-10-25 NOTE — Progress Notes (Signed)
Orthopedic Tech Progress Note Patient Details:  Tommy Robbins Aug 05, 1945 [id number]  Ortho Devices Type of Ortho Device: Knee Immobilizer Ortho Device/Splint Location: lle Ortho Device/Splint Interventions: Application   Chantia Amalfitano 10/25/2016, 2:19 PM

## 2016-10-25 NOTE — Progress Notes (Addendum)
CRITICAL VALUE ALERT  Critical Value: Trop 2.67  Date & Time Notied:  0236 10/25/16  Provider Notified: Dr. Virgina Organ  Orders Received/Actions taken: n/a

## 2016-10-26 ENCOUNTER — Encounter (HOSPITAL_COMMUNITY): Payer: Self-pay | Admitting: Cardiovascular Disease

## 2016-10-26 ENCOUNTER — Inpatient Hospital Stay (HOSPITAL_COMMUNITY): Payer: BC Managed Care – PPO

## 2016-10-26 DIAGNOSIS — J439 Emphysema, unspecified: Secondary | ICD-10-CM

## 2016-10-26 DIAGNOSIS — I4901 Ventricular fibrillation: Secondary | ICD-10-CM

## 2016-10-26 LAB — CBC
HEMATOCRIT: 36.2 % — AB (ref 39.0–52.0)
HEMOGLOBIN: 12.7 g/dL — AB (ref 13.0–17.0)
MCH: 30 pg (ref 26.0–34.0)
MCHC: 35.1 g/dL (ref 30.0–36.0)
MCV: 85.6 fL (ref 78.0–100.0)
PLATELETS: 123 10*3/uL — AB (ref 150–400)
RBC: 4.23 MIL/uL (ref 4.22–5.81)
RDW: 12.7 % (ref 11.5–15.5)
WBC: 8.1 10*3/uL (ref 4.0–10.5)

## 2016-10-26 LAB — BASIC METABOLIC PANEL
ANION GAP: 5 (ref 5–15)
BUN: 8 mg/dL (ref 6–20)
CHLORIDE: 108 mmol/L (ref 101–111)
CO2: 25 mmol/L (ref 22–32)
Calcium: 8.3 mg/dL — ABNORMAL LOW (ref 8.9–10.3)
Creatinine, Ser: 0.77 mg/dL (ref 0.61–1.24)
GFR calc Af Amer: >60 mL/min (ref >60)
GLUCOSE: 127 mg/dL — AB (ref 65–99)
POTASSIUM: 3.4 mmol/L — AB (ref 3.5–5.1)
Sodium: 138 mmol/L (ref 135–145)

## 2016-10-26 LAB — HEPARIN LEVEL (UNFRACTIONATED): Heparin Unfractionated: 0.42 IU/mL (ref 0.30–0.70)

## 2016-10-26 LAB — POCT ACTIVATED CLOTTING TIME: Activated Clotting Time: 114 seconds

## 2016-10-26 MED ORDER — LISINOPRIL 10 MG PO TABS
10.0000 mg | ORAL_TABLET | Freq: Every day | ORAL | Status: DC
  Administered 2016-10-26 – 2016-10-28 (×3): 10 mg via ORAL
  Filled 2016-10-26 (×3): qty 1

## 2016-10-26 MED ORDER — POTASSIUM CHLORIDE CRYS ER 20 MEQ PO TBCR
40.0000 meq | EXTENDED_RELEASE_TABLET | Freq: Once | ORAL | Status: AC
  Administered 2016-10-26: 40 meq via ORAL
  Filled 2016-10-26: qty 2

## 2016-10-26 MED ORDER — MORPHINE SULFATE ER 30 MG PO TBCR
45.0000 mg | EXTENDED_RELEASE_TABLET | Freq: Every day | ORAL | Status: DC
  Administered 2016-10-27 – 2016-10-29 (×3): 45 mg via ORAL
  Filled 2016-10-26 (×2): qty 1
  Filled 2016-10-26: qty 3

## 2016-10-26 MED ORDER — METOPROLOL TARTRATE 25 MG PO TABS
25.0000 mg | ORAL_TABLET | Freq: Two times a day (BID) | ORAL | Status: DC
  Administered 2016-10-26 – 2016-10-29 (×7): 25 mg via ORAL
  Filled 2016-10-26 (×7): qty 1

## 2016-10-26 MED ORDER — MORPHINE SULFATE ER 30 MG PO TBCR
30.0000 mg | EXTENDED_RELEASE_TABLET | Freq: Two times a day (BID) | ORAL | Status: DC
  Administered 2016-10-26 – 2016-10-28 (×6): 30 mg via ORAL
  Filled 2016-10-26: qty 1
  Filled 2016-10-26: qty 2
  Filled 2016-10-26 (×3): qty 1
  Filled 2016-10-26: qty 2

## 2016-10-26 MED ORDER — CLOPIDOGREL BISULFATE 75 MG PO TABS
75.0000 mg | ORAL_TABLET | Freq: Every day | ORAL | Status: DC
  Administered 2016-10-26 – 2016-10-29 (×4): 75 mg via ORAL
  Filled 2016-10-26 (×4): qty 1

## 2016-10-26 MED ORDER — ZOLPIDEM TARTRATE 5 MG PO TABS
5.0000 mg | ORAL_TABLET | Freq: Every evening | ORAL | Status: DC | PRN
  Administered 2016-10-26 – 2016-10-28 (×3): 5 mg via ORAL
  Filled 2016-10-26 (×3): qty 1

## 2016-10-26 MED ORDER — ENOXAPARIN SODIUM 40 MG/0.4ML ~~LOC~~ SOLN
40.0000 mg | SUBCUTANEOUS | Status: DC
  Administered 2016-10-27 – 2016-10-28 (×2): 40 mg via SUBCUTANEOUS
  Filled 2016-10-26 (×3): qty 0.4

## 2016-10-26 MED ORDER — ATORVASTATIN CALCIUM 80 MG PO TABS
80.0000 mg | ORAL_TABLET | Freq: Every day | ORAL | Status: DC
  Administered 2016-10-26 – 2016-10-28 (×3): 80 mg via ORAL
  Filled 2016-10-26 (×3): qty 1

## 2016-10-26 MED ORDER — GUAIFENESIN ER 600 MG PO TB12
600.0000 mg | ORAL_TABLET | Freq: Two times a day (BID) | ORAL | Status: DC | PRN
  Administered 2016-10-26 – 2016-10-28 (×3): 600 mg via ORAL
  Filled 2016-10-26 (×3): qty 1

## 2016-10-26 NOTE — Progress Notes (Signed)
31fr sheath aspirated and removed from left femoral vein. Manual pressure applied for 5 minutes, then IABP removed from left femoral artery. Manual pressure applied to both sites for 30 minutes. Groin level 0, no s+s of hematoma. Tegaderm dressing applied. Bedrest instructions given. Bilateral dp and pt pulses are palpable.   Bedrest begins at 14:30:00

## 2016-10-26 NOTE — Progress Notes (Signed)
Progress Note  Patient Name: Tommy Robbins Date of Encounter: 10/26/2016  Primary Cardiologist: Nanetta Batty MD  Subjective   Patient complains of inability to sleep or get comfortable with IABP in place. Constant chest pain worse with breathing (post CPR). Dry cough all night but denies dyspnea.   Inpatient Medications    Scheduled Meds: . aspirin  81 mg Oral Daily  . atorvastatin  80 mg Oral q1800  . clopidogrel  75 mg Oral Daily  . [START ON 10/27/2016] enoxaparin (LOVENOX) injection  40 mg Subcutaneous Q24H  . levothyroxine  88 mcg Oral QAC breakfast  . lidocaine  1 patch Transdermal Q24H  . lisinopril  10 mg Oral Daily  . mouth rinse  15 mL Mouth Rinse BID  . metoprolol tartrate  25 mg Oral BID  . pantoprazole  40 mg Oral Daily  . pneumococcal 23 valent vaccine  0.5 mL Intramuscular Tomorrow-1000  . potassium chloride  40 mEq Oral Once  . sodium chloride flush  3 mL Intravenous Q12H   Continuous Infusions: . sodium chloride 1,000 mL (10/25/16 2200)   PRN Meds: sodium chloride, acetaminophen, guaiFENesin, morphine injection, ondansetron (ZOFRAN) IV, sodium chloride flush   Vital Signs    Vitals:   10/26/16 0500 10/26/16 0600 10/26/16 0700 10/26/16 0746  BP: 108/83 129/90 134/81   Pulse: 79 72 83   Resp: 15 17 12    Temp:    98.7 F (37.1 C)  TempSrc:    Oral  SpO2: 97% 98% 96%   Weight:      Height:        Intake/Output Summary (Last 24 hours) at 10/26/16 0922 Last data filed at 10/26/16 0700  Gross per 24 hour  Intake          1385.33 ml  Output             2385 ml  Net          -999.67 ml   Filed Weights   10/25/16 0115  Weight: 161 lb 2.5 oz (73.1 kg)    Telemetry    NSR with PVC couplets and triplets. - Personally Reviewed  ECG    None today   Physical Exam   GEN: WD WM in no acute distress.   Neck: No JVD or bruits. Cardiac: RRR, no murmurs, rubs, or gallops.  Respiratory: few crackles left upper chest. Ribs very sore to  palpation. GI: Soft, nontender, non-distended  MS: No edema; No deformity. IABP in place in left groin. Some oozing around sheath otherwise no hematoma.  Neuro:  Nonfocal  Psych: Normal affect   Labs    Chemistry Recent Labs Lab 10/25/16 0512 10/25/16 1140 10/26/16 0430  NA 136 134* 138  K 3.4* 3.4* 3.4*  CL 105 103 108  CO2 24 22 25   GLUCOSE 117* 157* 127*  BUN 18 16 8   CREATININE 1.14 1.01 0.77  CALCIUM 8.4* 8.2* 8.3*  GFRNONAA >60 >60 >60  GFRAA >60 >60 >60  ANIONGAP 7 9 5      Hematology Recent Labs Lab 10/24/16 2342 10/25/16 0512 10/26/16 0430  WBC  --  8.7 8.1  RBC  --  4.29 4.23  HGB 12.2* 13.0 12.7*  HCT 36.0* 36.5* 36.2*  MCV  --  85.1 85.6  MCH  --  30.3 30.0  MCHC  --  35.6 35.1  RDW  --  12.5 12.7  PLT  --  154 123*    Cardiac Enzymes Recent Labs Lab  10/25/16 0104 10/25/16 0512 10/25/16 1140  TROPONINI 2.67* 11.45* 17.76*   No results for input(s): TROPIPOC in the last 168 hours.   BNPNo results for input(s): BNP, PROBNP in the last 168 hours.   DDimer No results for input(s): DDIMER in the last 168 hours.   Radiology    Dg Chest Port 1 View  Result Date: 10/25/2016 CLINICAL DATA:  Chest pain.  Recent cardiac catheterization. EXAM: PORTABLE CHEST 1 VIEW COMPARISON:  02/27/2009 FINDINGS: Portable view of the chest demonstrates the radiopaque tip of intra-aortic balloon pump in the upper/ mid descending thoracic aorta. Lungs are clear without focal airspace disease or pulmonary edema. No evidence for a pneumothorax. Heart size is within normal limits. IMPRESSION: Intra aortic balloon pump positioned in the descending thoracic aorta. No focal lung disease. Electronically Signed   By: Richarda Overlie M.D.   On: 10/25/2016 08:06    Cardiac Studies   Echo: 10/25/16: Study Conclusions  - Left ventricle: The cavity size was normal. There was mild   concentric hypertrophy. Systolic function was moderately reduced.   The estimated ejection fraction was  in the range of 35% to 40%.   Akinesis of the mid-apical anterolateral, mid-apical   anteroseptal, inferoseptal, and apical myocardium. Hypokinesis of   the inferior, anterior, and mid anterolateral wall. Doppler   parameters are consistent with abnormal left ventricular   relaxation (grade 1 diastolic dysfunction). Doppler parameters   are consistent with high ventricular filling pressure. - Mitral valve: Transvalvular velocity was within the normal range.   There was no evidence for stenosis. There was no regurgitation. - Left atrium: The atrium was mildly dilated. - Right ventricle: The cavity size was normal. Wall thickness was   normal. Systolic function was normal. - Atrial septum: No defect or patent foramen ovale was identified   by color flow Doppler. - Tricuspid valve: There was mild regurgitation. - Pulmonary arteries: Systolic pressure was mildly increased. PA   peak pressure: 41 mm Hg (S). - Global longitudinal strain -10.7% (abnormal).  Procedures   IABP Insertion  LEFT HEART CATH AND CORONARY ANGIOGRAPHY  Conclusion     Prox RCA to Mid RCA lesion, 100 %stenosed.  Ost LAD to Prox LAD lesion, 95 %stenosed.  Mid LAD lesion, 100 %stenosed.  Ost 2nd Diag lesion, 95 %stenosed.  Ost 1st Mrg to 1st Mrg lesion, 99 %stenosed.  2nd Mrg lesion, 99 %stenosed.  Ost Cx to Prox Cx lesion, 60 %stenosed.  Mid Cx to Dist Cx lesion, 99 %stenosed.  The left ventricular ejection fraction is 35-45% by visual estimate.  There is moderate to severe left ventricular systolic dysfunction.  LV end diastolic pressure is low.   Tommy Robbins is a 71 y.o. male    [id number] LOCATION:  FACILITY: [facility]  PHYSICIAN: Nanetta Batty, M.D. 12/04/1945   DATE OF PROCEDURE:  10/25/2016  DATE OF DISCHARGE:     CARDIAC CATHETERIZATION / Attempt LAD PCI    History obtained from chart review. Tommy Robbins is a 71 year old Caucasian male with a history of hepatitis C, IV  drug abuse and tobacco abuse however no prior cardiac history who developed chest pain at 10:00 tonight. The mass was called. He did have witnessed cardiac arrest with CPR with a ROSC of 17 minutes. He had a rhythm documented of ventricular fibrillation and was defibrillated. His EKG showed anterolateral ST segment elevation. He presents now for emergent cardiac catheterization.   PROCEDURE DESCRIPTION:   The patient was brought to the second  floor [facility]iac cath lab in the postabsorptive state. He was not  premedicated . His left groinwas prepped and shaved in usual sterile fashion. Xylocaine 1% was used  for local anesthesia. A 6 French sheath was inserted into the left common femoral artery using standard Seldinger technique. A 6 French sheath was inserted into the left common femoral vein. A 5 French left Judkins sinus the catheters along the 5 French pigtail catheter were used for selective coronary angiography, left ventriculography and distal abdominal aortography. Isovue dye was used for the entirety of the case. Retrograde aorta, left ventricular and pullback pressures were recorded.   The patient received aspirin by mouth, Angiomax bolus followed by infusion with a therapeutic ACT. Using a 6 [place] XB LAD 4 cm guide catheter along with a 14 per water guidewire a 2 mm balloon was able with great difficulty to get across the proximal LAD high-grade calcified lesion down to the mid LAD occlusion which appeared to be a CTO. I was unable to cross the proximal lesion with a balloon confirming the calcific nature of the lesion. After multiple attempts I abandoned the procedure. Because the patient was hemodynamically tenuous with ongoing chest pain I elected to place an intra-aortic balloon pump through the left common femoral artery.  IMPRESSION: Unsuccessful attempt at proximal mid LAD intervention in the setting of a large anterolateral infarct. The patient's EF is in the 30-35% with  anteroapical wall motion abnormality. This proximal LAD was high-grade and calcified and the entire LAD was fluoroscopically calcified. His mid LAD behaved as "a CTO" and I was unable to cross. I abandoned intervention elected to place an intra-aortic balloon pump for hemodynamic support given his borderline blood pressure. His LVEDP was low. He received intravenous fluid bolus. His potassium was low as well and he received potassium IV. He was electrocardiographically stable throughout the procedure. He is not a candidate for coronary artery bypass grafting since I do not think there is an LAD target. I recommended medical therapy at this time.  Nanetta Batty. MD, [facility] 10/25/2016 12:33 AM     Patient Profile     71 y.o. male without known CAD but h/o HTN, COPD, chronic Hepatitis C, GERD, chronic lower back pain and tobacco use 60 pack years presented with VF arrest  and post CPR ST elevation c/w STEMI.  Assessment & Plan    1. Out of hospital cardiac arrest with VF. In setting of STEMI. Peak troponin 17.76. Severe 3 vessel CAD found at cath. Unsuccessful PCI of the LAD due to severe calcified CTO type lesion that could not be expanded. Occluded RCA, small OM1. Severe disease in distal LCx and OM2. Per Dr. Hoyle Barr note yesterday patient not felt to be a candidate for CABG due to poor distal targets. Not a candidate for further PCI. Will maximize medical therapy. Most of chest pain now is due to rib injury from CPR. Will stop IV heparin at this point. Plan to remove IABP this am. Resume DVT prophylaxis tomorrow. Will add beta blocker, ACEi, statin. Will continue ASA and add Plavix for medically managed CAD.  2. Rib injury secondary to CPR. 3. Tobacco abuse- recommend smoking cessation 4. Hypokalemia - will replete 5. Acute systolic CHF EF 35-40% by Echo. Will start beta blocker and ACEi. Check CXR today. EDP actually low at time of cath.  6. Hypercholesterolemia. Start high dose statin.  7.  Chronic pain. Related to low back and now rib pain. Will resume home regimen  of MS contin and lidoderm. May use prn IV Morphine for break through.  8. Chronic hepatitis C 9. HTN will start ACEi and beta blocker. Potentially nitrates if BP allows.  10. Hypothyroidism. On replacement.    Signed, Derricka Mertz [place], MD  10/26/2016, 9:22 AM

## 2016-10-26 NOTE — Progress Notes (Signed)
Chaplain introduced herself to patient and a brother Arlys John) who is at bedside.  When asked about the Advanced Directive, brother introduced a typed document regarding finances that he wanted notarized.  Chaplain informed family member that we do not notarize financial documents, only medical AD type documents.  Brother notified Chaplain that the sister will be coming this afternoon to look over Advanced Directive information and get it filled out.  Chaplain shared with them to let their nurse know once they were ready for completion and we would assist in getting that done.    10/26/16 0949  Clinical Encounter Type  Visited With Patient;Family (Patient's brother Arlys John)  Visit Type Initial;Psychological support;Spiritual support;Social support  Referral From Physician  Consult/Referral To Chaplain

## 2016-10-26 NOTE — Plan of Care (Signed)
Problem: Spiritual Needs Goal: Ability to function at adequate level Outcome: Progressing Pt out of bed to chair this evening  Problem: Nutrition: Goal: Adequate nutrition will be maintained Outcome: Progressing Pt has poor appetite, encouraged PO intake  Problem: Cardiac: Goal: Ability to achieve and maintain adequate cardiopulmonary perfusion will improve Outcome: Progressing IABP removed today, pt is stable

## 2016-10-26 NOTE — Progress Notes (Addendum)
ANTICOAGULATION CONSULT NOTE  Pharmacy Consult for Heparin Indication: chest pain/ACS  No Known Allergies  Patient Measurements: Height: 5\' 10"  (177.8 cm) Weight: 161 lb 2.5 oz (73.1 kg) IBW/kg (Calculated) : 73 Heparin Dosing Weight: 70 kg  Vital Signs: Temp: 98.6 F (37 C) (08/06 0334) Temp Source: Oral (08/06 0334) BP: 134/81 (08/06 0700) Pulse Rate: 83 (08/06 0700)  Labs:  Recent Labs  10/24/16 2342 10/25/16 0104 10/25/16 0512 10/25/16 1140 10/25/16 1241 10/26/16 0430  HGB 12.2*  --  13.0  --   --  12.7*  HCT 36.0*  --  36.5*  --   --  36.2*  PLT  --   --  154  --   --  123*  HEPARINUNFRC  --   --   --   --  0.24* 0.42  CREATININE 0.90  --  1.14 1.01  --  0.77  TROPONINI  --  2.67* 11.45* 17.76*  --   --     Estimated Creatinine Clearance: 88.7 mL/min (by C-G formula based on SCr of 0.77 mg/dL).   Medical History: Past Medical History:  Diagnosis Date  . Cataract   . Cellulitis and abscess of right leg 10/06/2016   right thigh  . Chronic low back pain   . Colon polyps   . Depression   . Diverticulosis   . GERD (gastroesophageal reflux disease)   . Hep C w/ coma, chronic (HCC)   . Hypertension   . Insomnia   . Thyroid disease    hypothyroid    Assessment: 56 yoM presents s/p VF arrest with STEMI. Pt taken to cath lab with no intervention able to be performed and IABP placed, started on heparin drip per pharmacy. IABP remains in place, heparin level therapeutic at 0.42, CBC stable. Will continue with higher heparin level goal for today and then transition to lower IABP goal tomorrow after 48 hours as no intervention was able to be performed in cath lab and pt is not a candidate for surgery.  Goal of Therapy:  Heparin level 0.3-0.7 Monitor platelets by anticoagulation protocol: Yes   Plan:  -Continue heparin at 1150 units/hr -Monitor daily heparin level, CBC, S/Sx bleeding -Follow-up length of heparin therapy and IABP removal  Fredonia Highland,  PharmD PGY-2 Cardiology Pharmacy Resident Pager: [phone] 10/26/2016

## 2016-10-27 LAB — CBC
HEMATOCRIT: 33.4 % — AB (ref 39.0–52.0)
HEMOGLOBIN: 11.8 g/dL — AB (ref 13.0–17.0)
MCH: 30.4 pg (ref 26.0–34.0)
MCHC: 35.3 g/dL (ref 30.0–36.0)
MCV: 86.1 fL (ref 78.0–100.0)
Platelets: 101 10*3/uL — ABNORMAL LOW (ref 150–400)
RBC: 3.88 MIL/uL — AB (ref 4.22–5.81)
RDW: 12.9 % (ref 11.5–15.5)
WBC: 9.4 10*3/uL (ref 4.0–10.5)

## 2016-10-27 LAB — MAGNESIUM: Magnesium: 1.8 mg/dL (ref 1.7–2.4)

## 2016-10-27 MED ORDER — MAGNESIUM SULFATE IN D5W 1-5 GM/100ML-% IV SOLN
1.0000 g | Freq: Once | INTRAVENOUS | Status: AC
  Administered 2016-10-27: 1 g via INTRAVENOUS
  Filled 2016-10-27: qty 100

## 2016-10-27 MED ORDER — SPIRONOLACTONE 25 MG PO TABS
12.5000 mg | ORAL_TABLET | Freq: Every day | ORAL | Status: DC
  Administered 2016-10-27 – 2016-10-28 (×2): 12.5 mg via ORAL
  Filled 2016-10-27 (×2): qty 1

## 2016-10-27 NOTE — Progress Notes (Signed)
Requested [facility] clinical liaison Lupita Leash to bring RW to room.

## 2016-10-27 NOTE — Progress Notes (Signed)
Patient transferring to  3E report called to Kristin RN patient aware of transfer.  Maveric Debono Lynn, RN  

## 2016-10-27 NOTE — Progress Notes (Signed)
Progress Note  Patient Name: Tommy Robbins Date of Encounter: 10/27/2016  Primary Cardiologist: Nanetta Batty MD  Subjective   Patient complains of constant chest pain worse with breathing (post CPR). Dry cough  but denies dyspnea. Ambulated in unit.  Inpatient Medications    Scheduled Meds: . aspirin  81 mg Oral Daily  . atorvastatin  80 mg Oral q1800  . clopidogrel  75 mg Oral Daily  . enoxaparin (LOVENOX) injection  40 mg Subcutaneous Q24H  . levothyroxine  88 mcg Oral QAC breakfast  . lidocaine  1 patch Transdermal Q24H  . lisinopril  10 mg Oral Daily  . mouth rinse  15 mL Mouth Rinse BID  . metoprolol tartrate  25 mg Oral BID  . morphine  30 mg Oral BID  . morphine  45 mg Oral Q0600  . pantoprazole  40 mg Oral Daily  . pneumococcal 23 valent vaccine  0.5 mL Intramuscular Tomorrow-1000  . sodium chloride flush  3 mL Intravenous Q12H  . spironolactone  12.5 mg Oral Daily   Continuous Infusions: . sodium chloride Stopped (10/27/16 0600)  . magnesium sulfate 1 - 4 g bolus IVPB     PRN Meds: sodium chloride, acetaminophen, guaiFENesin, morphine injection, ondansetron (ZOFRAN) IV, sodium chloride flush, zolpidem   Vital Signs    Vitals:   10/27/16 0400 10/27/16 0500 10/27/16 0600 10/27/16 0700  BP: 125/77 123/79 124/87 (!) 120/101  Pulse: 69 65 67 88  Resp: (!) 25 19 18 16   Temp: 98.8 F (37.1 C)     TempSrc: Oral     SpO2: 95% 96% 97% 97%  Weight:      Height:        Intake/Output Summary (Last 24 hours) at 10/27/16 0742 Last data filed at 10/27/16 0700  Gross per 24 hour  Intake            721.5 ml  Output             1251 ml  Net           -529.5 ml   Filed Weights   10/25/16 0115  Weight: 161 lb 2.5 oz (73.1 kg)    Telemetry    NSR with PVC couplets and triplets. - Personally Reviewed  ECG    None today   Physical Exam   GEN: WD WM in no acute distress.   Neck: No JVD or bruits. Cardiac: RRR, no murmurs, rubs, or gallops.    Respiratory: few rhonchi, Ribs very sore to palpation. GI: Soft, nontender, non-distended  MS: No edema; No deformity. Left groin without hematoma. Pulse 2+.  Neuro:  Nonfocal  Psych: Normal affect   Labs    Chemistry  Recent Labs Lab 10/25/16 0512 10/25/16 1140 10/26/16 0430  NA 136 134* 138  K 3.4* 3.4* 3.4*  CL 105 103 108  CO2 24 22 25   GLUCOSE 117* 157* 127*  BUN 18 16 8   CREATININE 1.14 1.01 0.77  CALCIUM 8.4* 8.2* 8.3*  GFRNONAA >60 >60 >60  GFRAA >60 >60 >60  ANIONGAP 7 9 5      Hematology  Recent Labs Lab 10/25/16 0512 10/26/16 0430 10/27/16 0207  WBC 8.7 8.1 9.4  RBC 4.29 4.23 3.88*  HGB 13.0 12.7* 11.8*  HCT 36.5* 36.2* 33.4*  MCV 85.1 85.6 86.1  MCH 30.3 30.0 30.4  MCHC 35.6 35.1 35.3  RDW 12.5 12.7 12.9  PLT 154 123* 101*    Cardiac Enzymes  Recent Labs Lab  10/25/16 0104 10/25/16 0512 10/25/16 1140  TROPONINI 2.67* 11.45* 17.76*   No results for input(s): TROPIPOC in the last 168 hours.   BNPNo results for input(s): BNP, PROBNP in the last 168 hours.   DDimer No results for input(s): DDIMER in the last 168 hours.   Radiology    Dg Chest [place] 1v Same Day  Result Date: 10/26/2016 CLINICAL DATA:  Cough and chest pain. Status post cardiac catheterization and coronary artery stenting 10/25/2016. EXAM: PORTABLE CHEST 1 VIEW COMPARISON:  Single-view of the chest 10/25/2016. FINDINGS: Intra-aortic balloon pump is again seen and unchanged in position. The lungs are clear without edema. No pneumothorax or pleural effusion. There is cardiomegaly. Atherosclerosis is noted. IMPRESSION: No acute disease. Intra-aortic balloon pump is unchanged in position. Electronically Signed   By: Drusilla Kanner M.D.   On: 10/26/2016 09:54    Cardiac Studies   Echo: 10/25/16: Study Conclusions  - Left ventricle: The cavity size was normal. There was mild   concentric hypertrophy. Systolic function was moderately reduced.   The estimated ejection fraction was  in the range of 35% to 40%.   Akinesis of the mid-apical anterolateral, mid-apical   anteroseptal, inferoseptal, and apical myocardium. Hypokinesis of   the inferior, anterior, and mid anterolateral wall. Doppler   parameters are consistent with abnormal left ventricular   relaxation (grade 1 diastolic dysfunction). Doppler parameters   are consistent with high ventricular filling pressure. - Mitral valve: Transvalvular velocity was within the normal range.   There was no evidence for stenosis. There was no regurgitation. - Left atrium: The atrium was mildly dilated. - Right ventricle: The cavity size was normal. Wall thickness was   normal. Systolic function was normal. - Atrial septum: No defect or patent foramen ovale was identified   by color flow Doppler. - Tricuspid valve: There was mild regurgitation. - Pulmonary arteries: Systolic pressure was mildly increased. PA   peak pressure: 41 mm Hg (S). - Global longitudinal strain -10.7% (abnormal).  Procedures   IABP Insertion  LEFT HEART CATH AND CORONARY ANGIOGRAPHY  Conclusion     Prox RCA to Mid RCA lesion, 100 %stenosed.  Ost LAD to Prox LAD lesion, 95 %stenosed.  Mid LAD lesion, 100 %stenosed.  Ost 2nd Diag lesion, 95 %stenosed.  Ost 1st Mrg to 1st Mrg lesion, 99 %stenosed.  2nd Mrg lesion, 99 %stenosed.  Ost Cx to Prox Cx lesion, 60 %stenosed.  Mid Cx to Dist Cx lesion, 99 %stenosed.  The left ventricular ejection fraction is 35-45% by visual estimate.  There is moderate to severe left ventricular systolic dysfunction.  LV end diastolic pressure is low.   Tommy Robbins is a 71 y.o. male    [id number] LOCATION:  FACILITY: [facility]  PHYSICIAN: Nanetta Batty, M.D. [birth year]   DATE OF PROCEDURE:  10/25/2016  DATE OF DISCHARGE:     CARDIAC CATHETERIZATION / Attempt LAD PCI    History obtained from chart review. Tommy Robbins is a 71 year old Caucasian male with a history of hepatitis C, IV  drug abuse and tobacco abuse however no prior cardiac history who developed chest pain at 10:00 tonight. The mass was called. He did have witnessed cardiac arrest with CPR with a ROSC of 17 minutes. He had a rhythm documented of ventricular fibrillation and was defibrillated. His EKG showed anterolateral ST segment elevation. He presents now for emergent cardiac catheterization.   PROCEDURE DESCRIPTION:   The patient was brought to the second floor [facility] Cardiac cath lab  in the postabsorptive state. He was not  premedicated . His left groinwas prepped and shaved in usual sterile fashion. Xylocaine 1% was used  for local anesthesia. A 6 French sheath was inserted into the left common femoral artery using standard Seldinger technique. A 6 French sheath was inserted into the left common femoral vein. A 5 French left Judkins sinus the catheters along the 5 French pigtail catheter were used for selective coronary angiography, left ventriculography and distal abdominal aortography. Isovue dye was used for the entirety of the case. Retrograde aorta, left ventricular and pullback pressures were recorded.   The patient received aspirin by mouth, Angiomax bolus followed by infusion with a therapeutic ACT. Using a 6 [place] XB LAD 4 cm guide catheter along with a 14 per water guidewire a 2 mm balloon was able with great difficulty to get across the proximal LAD high-grade calcified lesion down to the mid LAD occlusion which appeared to be a CTO. I was unable to cross the proximal lesion with a balloon confirming the calcific nature of the lesion. After multiple attempts I abandoned the procedure. Because the patient was hemodynamically tenuous with ongoing chest pain I elected to place an intra-aortic balloon pump through the left common femoral artery.  IMPRESSION: Unsuccessful attempt at proximal mid LAD intervention in the setting of a large anterolateral infarct. The patient's EF is in the 30-35% with  anteroapical wall motion abnormality. This proximal LAD was high-grade and calcified and the entire LAD was fluoroscopically calcified. His mid LAD behaved as "a CTO" and I was unable to cross. I abandoned intervention elected to place an intra-aortic balloon pump for hemodynamic support given his borderline blood pressure. His LVEDP was low. He received intravenous fluid bolus. His potassium was low as well and he received potassium IV. He was electrocardiographically stable throughout the procedure. He is not a candidate for coronary artery bypass grafting since I do not think there is an LAD target. I recommended medical therapy at this time.  Nanetta Batty. MD, [facility] 10/25/2016 12:33 AM     Patient Profile     71 y.o. male without known CAD but h/o HTN, COPD, chronic Hepatitis C, GERD, chronic lower back pain and tobacco use 60 pack years presented with VF arrest  and post CPR ST elevation c/w STEMI.  Assessment & Plan    1. Out of hospital cardiac arrest with VF. In setting of STEMI. Peak troponin 17.76. Severe 3 vessel CAD found at cath. Unsuccessful PCI of the LAD due to severe calcified CTO type lesion that could not be expanded. Occluded RCA, small OM1. Severe disease in distal LCx and OM2. Per Dr. Hoyle Barr note patient not felt to be a candidate for CABG due to poor distal targets in discussion with CT surgery. Not a candidate for further PCI. Will maximize medical therapy. Most of chest pain now is due to rib injury from CPR. . On beta blocker, ACEi, statin, ASA, Plavix. Will add spironolactone. Titrate meds as BP allows. Patient informed that he may not drive for 6 months. Since EF 35-40% by Echo would not place Life vest. Will need repeat Echo in 2-3 months. Will transfer to telemetry today. Advance activity as tolerated with Cardiac Rehab.  2. Rib injury secondary to CPR. No fracture seen on CXR.  3. Tobacco abuse- recommend smoking cessation 4. Hypokalemia - will replete. 5. Acute  systolic CHF EF 35-40% by Echo. On beta blocker and ACEi. CXR clear.  EDP actually low at time  of cath. No evidence of volume overload at this time. 6. Hypercholesterolemia. On  high dose statin.  7. Chronic pain. Related to low back and now rib pain. Will resume home regimen of MS contin and lidoderm. May use prn IV Morphine for break through.  8. Chronic hepatitis C 9. HTN will start ACEi and beta blocker. Potentially nitrates if BP allows.  10. Hypothyroidism. On replacement.    Signed, Lupe Handley [place], MD  10/27/2016, 7:42 AM

## 2016-10-27 NOTE — Progress Notes (Signed)
CARDIAC REHAB PHASE I   PRE:  Rate/Rhythm: 68 SR    BP: sitting 118/80    SaO2: 97 RA  MODE:  Ambulation: 300 ft   POST:  Rate/Rhythm: 90 SR with PVC    BP: sitting 121/82     SaO2: 98 RA  Pt ambulated to BR then in hall with RW. Slightly unsteady, has significant back issues, usually wears a brace at home. Would benefit from RW at home for support. Tired with distance, to bed. In depth ed with pt and sister. Discussed MI, HF, daily wts, low sodium, smoking cessation, NTG, and CRPII. Pt with good reception. He has struggled with smoking cessation for years. He smokes 2-3 cigarettes now. We discussed a plan to get off these last cigarettes. He is interested in CRPII and will send referral to [facility]. Will f/u tomorrow. [phone]   Harriet Masson CES, ACSM 10/27/2016 10:53 AM

## 2016-10-27 NOTE — Care Management Note (Signed)
Case Management Note  Patient Details  Name: RAAD BAJEMA MRN: [id number] Date of Birth: [birth year]  Subjective/Objective:   From home with sister, niece and mother,  cardiac arrest with VF. In setting of STEMI, unsuccessful PCI, had IABP placed. Will be on plavix.  He ambulated 300 feet with cardiac rehab, he will need a rolling walker and states it ok to go thru [facility]  Will need walker brought up to room before discharge.                 Action/Plan: NCM will follow for dc needs.   Expected Discharge Date:                  Expected Discharge Plan:  Home/Self Care  In-House Referral:     Discharge planning Services  CM Consult  Post Acute Care Choice:  Durable Medical Equipment Choice offered to:  Patient, Sibling  DME Arranged:  Walker rolling DME Agency:  [facility]  HH Arranged:  NA HH Agency:  NA  Status of Service:  Completed, signed off  If discussed at [place] Length of Stay Meetings, dates discussed:    Additional Comments:  Leone Haven, RN 10/27/2016, 10:55 AM

## 2016-10-28 LAB — BASIC METABOLIC PANEL
Anion gap: 8 (ref 5–15)
BUN: 12 mg/dL (ref 6–20)
CHLORIDE: 105 mmol/L (ref 101–111)
CO2: 24 mmol/L (ref 22–32)
CREATININE: 0.65 mg/dL (ref 0.61–1.24)
Calcium: 8.3 mg/dL — ABNORMAL LOW (ref 8.9–10.3)
GFR calc Af Amer: >60 mL/min (ref >60)
GFR calc non Af Amer: >60 mL/min (ref >60)
Glucose, Bld: 90 mg/dL (ref 65–99)
Potassium: 4 mmol/L (ref 3.5–5.1)
Sodium: 137 mmol/L (ref 135–145)

## 2016-10-28 LAB — CBC
HEMATOCRIT: 33.8 % — AB (ref 39.0–52.0)
HEMOGLOBIN: 11.6 g/dL — AB (ref 13.0–17.0)
MCH: 29.7 pg (ref 26.0–34.0)
MCHC: 34.3 g/dL (ref 30.0–36.0)
MCV: 86.7 fL (ref 78.0–100.0)
Platelets: 100 10*3/uL — ABNORMAL LOW (ref 150–400)
RBC: 3.9 MIL/uL — ABNORMAL LOW (ref 4.22–5.81)
RDW: 12.8 % (ref 11.5–15.5)
WBC: 7 10*3/uL (ref 4.0–10.5)

## 2016-10-28 MED ORDER — LISINOPRIL 20 MG PO TABS
20.0000 mg | ORAL_TABLET | Freq: Every day | ORAL | Status: DC
  Administered 2016-10-29: 20 mg via ORAL
  Filled 2016-10-28: qty 1

## 2016-10-28 MED ORDER — SPIRONOLACTONE 25 MG PO TABS
25.0000 mg | ORAL_TABLET | Freq: Every day | ORAL | Status: DC
Start: 2016-10-29 — End: 2016-10-29
  Administered 2016-10-29: 25 mg via ORAL
  Filled 2016-10-28: qty 1

## 2016-10-28 MED ORDER — MAGNESIUM HYDROXIDE 400 MG/5ML PO SUSP
30.0000 mL | Freq: Every day | ORAL | Status: DC | PRN
  Administered 2016-10-28: 30 mL via ORAL
  Filled 2016-10-28: qty 30

## 2016-10-28 NOTE — Progress Notes (Signed)
Progress Note  Patient Name: Tommy Robbins Date of Encounter: 10/28/2016  Primary Cardiologist: Dr. Swaziland   Subjective   Complains of chest soreness post CPR. Felt like prior broken rib few months ago. No dyspnea.   Inpatient Medications    Scheduled Meds: . aspirin  81 mg Oral Daily  . atorvastatin  80 mg Oral q1800  . clopidogrel  75 mg Oral Daily  . enoxaparin (LOVENOX) injection  40 mg Subcutaneous Q24H  . levothyroxine  88 mcg Oral QAC breakfast  . lidocaine  1 patch Transdermal Q24H  . lisinopril  10 mg Oral Daily  . mouth rinse  15 mL Mouth Rinse BID  . metoprolol tartrate  25 mg Oral BID  . morphine  30 mg Oral BID  . morphine  45 mg Oral Q0600  . pantoprazole  40 mg Oral Daily  . pneumococcal 23 valent vaccine  0.5 mL Intramuscular Tomorrow-1000  . sodium chloride flush  3 mL Intravenous Q12H  . spironolactone  12.5 mg Oral Daily   Continuous Infusions: . sodium chloride Stopped (10/27/16 0600)   PRN Meds: sodium chloride, acetaminophen, guaiFENesin, morphine injection, ondansetron (ZOFRAN) IV, sodium chloride flush, zolpidem   Vital Signs    Vitals:   10/27/16 1200 10/27/16 1248 10/27/16 2115 10/28/16 0503  BP: 116/76 136/82 121/73 139/77  Pulse: (!) 55 63 74 65  Resp: 15 18 20 18   Temp:  98.3 F (36.8 C) 98.6 F (37 C) 98.6 F (37 C)  TempSrc:  Oral Oral Oral  SpO2: 98%  98% 96%  Weight:  158 lb 11.7 oz (72 kg)  157 lb 1.6 oz (71.3 kg)  Height:  5\' 10"  (1.778 m)      Intake/Output Summary (Last 24 hours) at 10/28/16 1106 Last data filed at 10/28/16 0908  Gross per 24 hour  Intake              120 ml  Output              650 ml  Net             -530 ml   Filed Weights   10/25/16 0115 10/27/16 1248 10/28/16 0503  Weight: 161 lb 2.5 oz (73.1 kg) 158 lb 11.7 oz (72 kg) 157 lb 1.6 oz (71.3 kg)    Telemetry    NSR  - Personally Reviewed  ECG    None today   Physical Exam   GEN: No acute distress.   Neck: No JVD Cardiac: RRR, no  murmurs, rubs, or gallops.  Respiratory: Clear to auscultation bilaterally with course breath sound GI: Soft, nontender, non-distended  MS: No edema; No deformity. Neuro:  Nonfocal  Psych: Normal affect   Labs    Chemistry Recent Labs Lab 10/25/16 1140 10/26/16 0430 10/28/16 0325  NA 134* 138 137  K 3.4* 3.4* 4.0  CL 103 108 105  CO2 22 25 24   GLUCOSE 157* 127* 90  BUN 16 8 12   CREATININE 1.01 0.77 0.65  CALCIUM 8.2* 8.3* 8.3*  GFRNONAA >60 >60 >60  GFRAA >60 >60 >60  ANIONGAP 9 5 8      Hematology Recent Labs Lab 10/26/16 0430 10/27/16 0207 10/28/16 0325  WBC 8.1 9.4 7.0  RBC 4.23 3.88* 3.90*  HGB 12.7* 11.8* 11.6*  HCT 36.2* 33.4* 33.8*  MCV 85.6 86.1 86.7  MCH 30.0 30.4 29.7  MCHC 35.1 35.3 34.3  RDW 12.7 12.9 12.8  PLT 123* 101* 100*  Cardiac Enzymes Recent Labs Lab 10/25/16 0104 10/25/16 0512 10/25/16 1140  TROPONINI 2.67* 11.45* 17.76*   No results for input(s): TROPIPOC in the last 168 hours.   BNPNo results for input(s): BNP, PROBNP in the last 168 hours.   DDimer No results for input(s): DDIMER in the last 168 hours.   Radiology    No results found.  Cardiac Studies   Echo: 10/25/16:  Study Conclusions  - Left ventricle: The cavity size was normal. There was mild concentric hypertrophy. Systolic function was moderately reduced. The estimated ejection fraction was in the range of 35% to 40%. Akinesis of the mid-apical anterolateral, mid-apical anteroseptal, inferoseptal, and apical myocardium. Hypokinesis of the inferior, anterior, and mid anterolateral wall. Doppler parameters are consistent with abnormal left ventricular relaxation (grade 1 diastolic dysfunction). Doppler parameters are consistent with high ventricular filling pressure. - Mitral valve: Transvalvular velocity was within the normal range. There was no evidence for stenosis. There was no regurgitation. - Left atrium: The atrium was mildly  dilated. - Right ventricle: The cavity size was normal. Wall thickness was normal. Systolic function was normal. - Atrial septum: No defect or patent foramen ovale was identified by color flow Doppler. - Tricuspid valve: There was mild regurgitation. - Pulmonary arteries: Systolic pressure was mildly increased. PA peak pressure: 41 mm Hg (S). - Global longitudinal strain -10.7% (abnormal).  IABP Insertion 10/24/16  LEFT HEART CATH AND CORONARY ANGIOGRAPHY  Conclusion     Prox RCA to Mid RCA lesion, 100 %stenosed.  Ost LAD to Prox LAD lesion, 95 %stenosed.  Mid LAD lesion, 100 %stenosed.  Ost 2nd Diag lesion, 95 %stenosed.  Ost 1st Mrg to 1st Mrg lesion, 99 %stenosed.  2nd Mrg lesion, 99 %stenosed.  Ost Cx to Prox Cx lesion, 60 %stenosed.  Mid Cx to Dist Cx lesion, 99 %stenosed.  The left ventricular ejection fraction is 35-45% by visual estimate.  There is moderate to severe left ventricular systolic dysfunction.  LV end diastolic pressure is low.     IMPRESSION: Unsuccessful attempt at proximal mid LAD intervention in the setting of a large anterolateral infarct. The patient's EF is in the 30-35% with anteroapical wall motion abnormality. This proximal LAD was high-grade and calcified and the entire LAD was fluoroscopically calcified. His mid LAD behaved as "a CTO" and I was unable to cross. I abandoned intervention elected to place an intra-aortic balloon pump for hemodynamic support given his borderline blood pressure. His LVEDP was low. He received intravenous fluid bolus. His potassium was low as well and he received potassium IV. He was electrocardiographically stable throughout the procedure. He is not a candidate for coronary artery bypass grafting since I do not think there is an LAD target. I recommended medical therapy at this time.  Patient Profile     71 y.o. male without known CAD but h/o HTN, COPD, chronic Hepatitis C, GERD, chronic lower back pain and  tobacco use 60 pack years presented with VF arrest  and post CPR ST elevation c/w STEMI.  Assessment & Plan    1. Cardiac arrest with VF in setting of STEMI (outside hospital) - Peak of troponin 17.76. Cath showed 3V disease. Unsuccessful PCI of the LAD due to severe calcified CTO type lesion that could not be expanded. Occluded RCA, small OM1. Severe disease in distal LCx and OM2. Per Dr. Hoyle Barr note patient not felt to be a candidate for CABG due to poor distal targets in discussion with CT surgery. Not a candidate for  further PCI.  - Plan to maximize medical therapy. Continue ASA, Plavix, BB, ACE and spironolactone. No driving for 6 months.   2. Rib Injury 2nd to CPR and chronic back pain - No fracture noted on 1 view CXR. Feels like prior rib fracture. If no improvement, can consider 2V CXR. F/u with PCP.  - On  home regimen of MS contin and lidoderm  3. Acute systolic CHF/ICM - volume status stable. EDP was low during cath. Echo showed LVEF of 35-40%. No needed LifeVest. Needs repeat echo in 2-3 months. - Continue BB, ACE and Spironolactone. Up titrate as needed.   4. HLD - No results found for requested labs within last 8760 hours. Continue statin. Get lipid panel.   5. HTN - stable and well controlled on current medications.   6. Social situation - His bedroom on 2nd floor and he is concern regarding walking up stairs. Advised to walk up stair here with cardiac rehab. If needed get PT eval. Might need to send to nursing home for few weeks.   Signed, Manson Passey, PA  10/28/2016, 11:06 AM    Patient seen and examined and history reviewed. Agree with above findings and plan. Still having a lot of rib pain. Difficult for him to sleep. Otherwise progressing well with increased activity. VSS. Will titrate ACEi and ARB. Anticipate DC in am if stable.   Asahd Can [place], MDFACC 10/28/2016 12:06 PM

## 2016-10-29 ENCOUNTER — Telehealth: Payer: Self-pay | Admitting: Physician Assistant

## 2016-10-29 DIAGNOSIS — I469 Cardiac arrest, cause unspecified: Secondary | ICD-10-CM

## 2016-10-29 LAB — BASIC METABOLIC PANEL
Anion gap: 8 (ref 5–15)
BUN: 12 mg/dL (ref 6–20)
CHLORIDE: 101 mmol/L (ref 101–111)
CO2: 25 mmol/L (ref 22–32)
Calcium: 8.4 mg/dL — ABNORMAL LOW (ref 8.9–10.3)
Creatinine, Ser: 0.72 mg/dL (ref 0.61–1.24)
GFR calc Af Amer: >60 mL/min (ref >60)
GFR calc non Af Amer: >60 mL/min (ref >60)
GLUCOSE: 96 mg/dL (ref 65–99)
POTASSIUM: 3.5 mmol/L (ref 3.5–5.1)
Sodium: 134 mmol/L — ABNORMAL LOW (ref 135–145)

## 2016-10-29 LAB — CBC
HCT: 32.8 % — ABNORMAL LOW (ref 39.0–52.0)
HEMOGLOBIN: 11.6 g/dL — AB (ref 13.0–17.0)
MCH: 30.9 pg (ref 26.0–34.0)
MCHC: 35.4 g/dL (ref 30.0–36.0)
MCV: 87.2 fL (ref 78.0–100.0)
Platelets: 104 10*3/uL — ABNORMAL LOW (ref 150–400)
RBC: 3.76 MIL/uL — AB (ref 4.22–5.81)
RDW: 12.9 % (ref 11.5–15.5)
WBC: 5.6 10*3/uL (ref 4.0–10.5)

## 2016-10-29 LAB — LIPID PANEL
CHOL/HDL RATIO: 2.2 ratio
Cholesterol: 88 mg/dL (ref 0–200)
HDL: 40 mg/dL — ABNORMAL LOW (ref >40)
LDL CALC: 35 mg/dL (ref 0–99)
Triglycerides: 64 mg/dL (ref <150)
VLDL: 13 mg/dL (ref 0–40)

## 2016-10-29 MED ORDER — ASPIRIN 81 MG PO CHEW: 81.0000 mg | CHEWABLE_TABLET | Freq: Every day | ORAL | 11 refills | Status: DC

## 2016-10-29 MED ORDER — CLOPIDOGREL BISULFATE 75 MG PO TABS: 75.0000 mg | ORAL_TABLET | Freq: Every day | ORAL | 11 refills | Status: DC

## 2016-10-29 MED ORDER — LEVOTHYROXINE SODIUM 88 MCG PO TABS
88.0000 ug | ORAL_TABLET | Freq: Every day | ORAL | Status: DC
  Administered 2016-10-29: 88 ug via ORAL
  Filled 2016-10-29: qty 1

## 2016-10-29 MED ORDER — SPIRONOLACTONE 25 MG PO TABS: 25.0000 mg | ORAL_TABLET | Freq: Every day | ORAL | 6 refills | Status: DC

## 2016-10-29 MED ORDER — ATORVASTATIN CALCIUM 80 MG PO TABS: 80.0000 mg | ORAL_TABLET | Freq: Every day | ORAL | 6 refills | Status: DC

## 2016-10-29 MED ORDER — LISINOPRIL 20 MG PO TABS: 20.0000 mg | ORAL_TABLET | Freq: Every day | ORAL | 6 refills | Status: DC

## 2016-10-29 MED ORDER — METOPROLOL TARTRATE 25 MG PO TABS: 25.0000 mg | ORAL_TABLET | Freq: Two times a day (BID) | ORAL | 6 refills | Status: DC

## 2016-10-29 NOTE — Progress Notes (Signed)
Pt has orders to be discharged. Discharge instructions given and pt has no additional questions at this time. Medication regimen reviewed and pt educated. Pt verbalized understanding and has no additional questions. Telemetry box removed. IV removed and site in good condition. Pt stable and waiting for transportation. 

## 2016-10-29 NOTE — Discharge Summary (Signed)
Discharge Summary    Patient ID: Tommy Robbins,  MRN: [id number], DOB/AGE: [birth year] 71 y.o.  Admit date: 10/24/2016 Discharge date: 10/29/2016  Primary Care Provider: Milinda Antis Robbins Primary Cardiologist:  Dr. Allyson Robbins  Discharge Diagnoses    Principal Problem:   Cardiac arrest [facility]) Active Problems:   Acute ST elevation myocardial infarction (STEMI) involving left anterior descending (LAD) coronary artery (HCC)   Hypothyroidism   DISORDER, TOBACCO USE   HYPERTENSION, BENIGN ESSENTIAL   COPD (chronic obstructive pulmonary disease) (HCC)   Hepatitis C   GERD (gastroesophageal reflux disease)   Chronic low back pain   ICM   HLD  Allergies No Known Allergies  Diagnostic Studies/Procedures    Echo: 10/25/16: Study Conclusions  - Left ventricle: The cavity size was normal. There was mild concentric hypertrophy. Systolic function was moderately reduced. The estimated ejection fraction was in the range of 35% to 40%. Akinesis of the mid-apical anterolateral, mid-apical anteroseptal, inferoseptal, and apical myocardium. Hypokinesis of the inferior, anterior, and mid anterolateral wall. Doppler parameters are consistent with abnormal left ventricular relaxation (grade 1 diastolic dysfunction). Doppler parameters are consistent with high ventricular filling pressure. - Mitral valve: Transvalvular velocity was within the normal range. There was no evidence for stenosis. There was no regurgitation. - Left atrium: The atrium was mildly dilated. - Right ventricle: The cavity size was normal. Wall thickness was normal. Systolic function was normal. - Atrial septum: No defect or patent foramen ovale was identified by color flow Doppler. - Tricuspid valve: There was mild regurgitation. - Pulmonary arteries: Systolic pressure was mildly increased. PA peak pressure: 41 mm Hg (S). - Global longitudinal strain -10.7% (abnormal).  IABP Insertion 10/24/16    LEFT HEART CATH AND CORONARY ANGIOGRAPHY  Conclusion     Prox RCA to Mid RCA lesion, 100 %stenosed.  Ost LAD to Prox LAD lesion, 95 %stenosed.  Mid LAD lesion, 100 %stenosed.  Ost 2nd Diag lesion, 95 %stenosed.  Ost 1st Mrg to 1st Mrg lesion, 99 %stenosed.  2nd Mrg lesion, 99 %stenosed.  Ost Cx to Prox Cx lesion, 60 %stenosed.  Mid Cx to Dist Cx lesion, 99 %stenosed.  The left ventricular ejection fraction is 35-45% by visual estimate.  There is moderate to severe left ventricular systolic dysfunction.  LV end diastolic pressure is low.    IMPRESSION:Unsuccessful attempt at proximal mid LAD intervention in the setting of a large anterolateral infarct. The patient's EF is in the 30-35% with anteroapical wall motion abnormality. This proximal LAD was high-grade and calcified and the entire LAD was fluoroscopically calcified. His mid LAD behaved as "a CTO" and I was unable to cross. I abandoned intervention elected to place an intra-aortic balloon pump for hemodynamic support given his borderline blood pressure. His LVEDP was low. He received intravenous fluid bolus. His potassium was low as well and he received potassium IV. He was electrocardiographically stable throughout the procedure. He is not a candidate for coronary artery bypass grafting since I do not think there is an LAD target. I recommended medical therapy at this time.   History of Present Illness     71 y.o.malewithout known CAD but h/o HTN, COPD, chronic Hepatitis C, GERD, chronic lower back pain and tobacco use 60 pack years presented with VF arrest and post CPR ST elevation c/w STEMI.  Hospital Course     Consultants: None   1. Cardiac arrest with VF in setting of STEMI (outside hospital) - Peak of troponin 17.76. Cath showed  3V disease. Unsuccessful PCI of the LAD due to severe calcified CTO type lesion that could not be expanded. Occluded RCA, small OM1. Severe disease in distal LCx and OM2. Per Tommy Robbins note patient not felt to be a candidate for CABG due to poor distal targets in discussion with CT surgery. Not a candidate for further PCI.  - Plan tomaximize medical therapy. Continue ASA, Plavix, BB, ACE and spironolactone. Increased lisinopril to 20mg  qd at day of dishcarge. No driving for 6 months.   2. Rib Injury 2nd to CPR and chronic back pain - No fracture noted on 1 view CXR. Feels like prior rib fracture. Pain improved onhome regimen of MS contin and lidoderm. Robbins/u with PCP.   3. Acute systolic CHF/ICM - Volume status stable. EDP was low during cath. Echo showed LVEF of 35-40%. No needed LifeVest. Needs repeat echo in 2-3 months. Continue BB, ACE and Spironolactone. Will need repeat Echo in 2-3 months. If Echo on follow up shows EF less than 35% would need to consider for ICD.    4. HLD - 10/29/2016: Cholesterol 88; HDL 40; LDL Cholesterol 35; Triglycerides 64; VLDL 13. Continue statin.   5. HTN - stable and well controlled on current medications.   6. Social situation - Able to walk on stair without any discomfort. Will go home on DME walker.   The patient has been seen by Dr. Swaziland  today and deemed ready for discharge home. All follow-up appointments have been scheduled. Discharge medications are listed below.   Discharge Vitals Blood pressure 128/78, pulse 66, temperature 98.7 Robbins (37.1 C), temperature source Oral, resp. rate 18, height 5\' 10"  (1.778 m), weight 157 lb 6.4 oz (71.4 kg), SpO2 96 %.  Filed Weights   10/27/16 1248 10/28/16 0503 10/29/16 0459  Weight: 158 lb 11.7 oz (72 kg) 157 lb 1.6 oz (71.3 kg) 157 lb 6.4 oz (71.4 kg)    Labs & Radiologic Studies     CBC  Recent Labs  10/28/16 0325 10/29/16 0332  WBC 7.0 5.6  HGB 11.6* 11.6*  HCT 33.8* 32.8*  MCV 86.7 87.2  PLT 100* 104*   Basic Metabolic Panel  Recent Labs  10/27/16 0207 10/28/16 0325 10/29/16 0332  NA  --  137 134*  K  --  4.0 3.5  CL  --  105 101  CO2  --  24 25    GLUCOSE  --  90 96  BUN  --  12 12  CREATININE  --  0.65 0.72  CALCIUM  --  8.3* 8.4*  MG 1.8  --   --    Fasting Lipid Panel  Recent Labs  10/29/16 0332  CHOL 88  HDL 40*  LDLCALC 35  TRIG 64  CHOLHDL 2.2   Thyroid Function Tests No results for input(s): TSH, T4TOTAL, T3FREE, THYROIDAB in the last 72 hours.  Invalid input(s): FREET3  Dg Chest Port 1 View  Result Date: 10/25/2016 CLINICAL DATA:  Chest pain.  Recent cardiac catheterization. EXAM: PORTABLE CHEST 1 VIEW COMPARISON:  02/27/2009 FINDINGS: Portable view of the chest demonstrates the radiopaque tip of intra-aortic balloon pump in the upper/ mid descending thoracic aorta. Lungs are clear without focal airspace disease or pulmonary edema. No evidence for a pneumothorax. Heart size is within normal limits. IMPRESSION: Intra aortic balloon pump positioned in the descending thoracic aorta. No focal lung disease. Electronically Signed   By: Richarda Overlie M.D.   On: 10/25/2016 08:06   Dg Chest  Port 1v Same Day  Result Date: 10/26/2016 CLINICAL DATA:  Cough and chest pain. Status post cardiac catheterization and coronary artery stenting 10/25/2016. EXAM: PORTABLE CHEST 1 VIEW COMPARISON:  Single-view of the chest 10/25/2016. FINDINGS: Intra-aortic balloon pump is again seen and unchanged in position. The lungs are clear without edema. No pneumothorax or pleural effusion. There is cardiomegaly. Atherosclerosis is noted. IMPRESSION: No acute disease. Intra-aortic balloon pump is unchanged in position. Electronically Signed   By: Drusilla Kanner M.D.   On: 10/26/2016 09:54    Disposition   Pt is being discharged home today in good condition.  Follow-up Plans & Appointments    Follow-up Information    [facility] - Dme Follow up.   Why:  RW to be delivered to room prior to DC Contact information: [address] [place] [address] [phone]        Azalee Course, [place]. Go on 11/11/2016.   Specialties:   Cardiology, Radiology Why:  8am for TCM Contact information: [address] [place] [address] [phone]        Salley Scarlet, MD. Go in 5 day(s).   Specialty:  Family Medicine Why:  post hospital  Contact information: [address] [place] [address] [phone]          Discharge Instructions    Amb Referral to Cardiac Rehabilitation    Complete by:  As directed    Diagnosis:   Coronary Stents PTCA STEMI     Diet - low sodium heart healthy    Complete by:  As directed    Discharge instructions    Complete by:  As directed    No driving for 6 months. No lifting over 10 lbs for 4 weeks. No sexual activity for 4 weeks. You may not return to work until cleared by your cardiologist. Keep procedure site clean & dry. If you notice increased pain, swelling, bleeding or pus, call/return!  You may shower, but no soaking baths/hot tubs/pools for 1 week.   Follow up with PCP for pain medication management.   Increase activity slowly    Complete by:  As directed       Discharge Medications   Current Discharge Medication List    START taking these medications   Details  aspirin 81 MG chewable tablet Chew 1 tablet (81 mg total) by mouth daily. Qty: 30 tablet, Refills: 11    atorvastatin (LIPITOR) 80 MG tablet Take 1 tablet (80 mg total) by mouth daily at 6 PM. Qty: 30 tablet, Refills: 6    clopidogrel (PLAVIX) 75 MG tablet Take 1 tablet (75 mg total) by mouth daily. Qty: 30 tablet, Refills: 11    lisinopril (PRINIVIL,ZESTRIL) 20 MG tablet Take 1 tablet (20 mg total) by mouth daily. Qty: 30 tablet, Refills: 6    metoprolol tartrate (LOPRESSOR) 25 MG tablet Take 1 tablet (25 mg total) by mouth 2 (two) times daily. Qty: 30 tablet, Refills: 6    spironolactone (ALDACTONE) 25 MG tablet Take 1 tablet (25 mg total) by mouth daily. Qty: 30 tablet, Refills: 6      CONTINUE these medications which have NOT CHANGED   Details  gabapentin  (NEURONTIN) 100 MG capsule TAKE 1 TO 2 CAPSULES BY MOUTH AT BEDTIME EVERY OTHER DAY Refills: 3    levothyroxine (SYNTHROID, LEVOTHROID) 88 MCG tablet TAKE 1 TABLET DAILY BEFORE BREAKFAST Qty: 90 tablet, Refills: 2    lidocaine (LIDODERM) 5 % USE 3  PATCHES ON THE SKIN DAILY Refills: 4    !! morphine (MS CONTIN) 15 MG 12 hr tablet Take 30-45 mg by mouth See admin instructions. Takes 45 mg in the morning, 30 mg in the afternoon and 30 mg at night Refills: 0    !! morphine (MS CONTIN) 30 MG 12 hr tablet Take 30-45 mg by mouth See admin instructions. Takes 45 mg in the morning, 30 mg in the afternoon, and 30 mg at night    Nutritional Supplements (JUICE PLUS FIBRE PO) Take 2 capsules by mouth 2 (two) times daily.    OVER THE COUNTER MEDICATION Take 1-2 tablets by mouth as needed (ALLERGIES). antihistamine    pantoprazole (PROTONIX) 40 MG tablet TAKE 1 TABLET DAILY Qty: 30 tablet, Refills: 0    temazepam (RESTORIL) 15 MG capsule Take 1 capsule (15 mg total) by mouth at bedtime as needed for sleep. Qty: 45 capsule, Refills: 0   Associated Diagnoses: Sleep disorder    traZODone (DESYREL) 50 MG tablet Take 50 mg by mouth at bedtime.  Refills: 2    varenicline (CHANTIX) 1 MG tablet Take 1 tablet (1 mg total) by mouth 2 (two) times daily. Qty: 60 tablet, Refills: 5     !! - Potential duplicate medications found. Please discuss with provider.    STOP taking these medications     amLODipine (NORVASC) 10 MG tablet      lisinopril-hydrochlorothiazide (PRINZIDE,ZESTORETIC) 20-12.5 MG tablet      potassium chloride (KLOR-CON M10) 10 MEQ tablet      doxycycline (VIBRA-TABS) 100 MG tablet      sulfamethoxazole-trimethoprim (BACTRIM DS,SEPTRA DS) 800-160 MG tablet          Aspirin prescribed at discharge?  Yes High Intensity Statin Prescribed? (Lipitor 40-80mg  or Crestor 20-40mg ): Yes Beta Blocker Prescribed? Yes For EF 45% or less, Was ACEI/ARB Prescribed? Yes ADP Receptor Inhibitor  Prescribed? (i.e. Plavix etc.-Includes Medically Managed Patients): Yes For EF <40%, Aldosterone Inhibitor Prescribed? Yes Was EF assessed during THIS hospitalization? Yes Was Cardiac Rehab II ordered? (Included Medically managed Patients): Yes   Outstanding Labs/Studies   Echo in 2-3 months.  Consider OP Robbins/u labs 6-8 weeks given statin initiation this admission.  Duration of Discharge Encounter   Greater than 30 minutes including physician time.  Signed, Keanu Lesniak PA-C 10/29/2016, 9:57 AM

## 2016-10-29 NOTE — Telephone Encounter (Signed)
Patient contacted regarding discharge from [facility] on 10/29/16.  Patient understands to follow up with provider meng on 11/11/16 at 8 am at [facility]. Patient understands discharge instructions? yes Patient understands medications and regiment? yes  Patient understands to bring all medications to this visit? yes

## 2016-10-29 NOTE — Evaluation (Signed)
Physical Therapy Evaluation Patient Details Name: Tommy Robbins MRN: [id number] DOB: 02-Nov-1945 Today's Date: 10/29/2016   History of Present Illness  71 yo male with onset of cardiac arrest and CPR with 4 shocks was referred to PT.  Has residual pain from CPR and dx STEMI, PMHx:  HLD, CHF, smoker, Hep C, LBP, CAD, COPD, HTN,   Clinical Impression  Pt is up to walk with PT and negotiated stairs with success,     Follow Up Recommendations Home health PT;Supervision for mobility/OOB    Equipment Recommendations  Rolling walker with 5" wheels    Recommendations for Other Services       Precautions / Restrictions Precautions Precautions: Fall (telemetry) Restrictions Weight Bearing Restrictions: No      Mobility  Bed Mobility Overal bed mobility: Modified Independent                Transfers Overall transfer level: Modified independent Equipment used: Rolling walker (2 wheeled)             General transfer comment: able to use good hand placement  Ambulation/Gait Ambulation/Gait assistance: Min guard;Supervision Ambulation Distance (Feet): 200 Feet Assistive device: Rolling walker (2 wheeled);1 person hand held assist Gait Pattern/deviations: Step-through pattern;Wide base of support;Trunk flexed;Decreased stride length Gait velocity: reduced Gait velocity interpretation: Below normal speed for age/gender General Gait Details: pt able to maneuver walker and does have some pain related to CPR but just medicated prior to PT arriving  Stairs Stairs: (P) Yes          Wheelchair Mobility    Modified Rankin (Stroke Patients Only)       Balance Overall balance assessment: Needs assistance Sitting-balance support: Feet supported Sitting balance-Leahy Scale: Good     Standing balance support: Bilateral upper extremity supported Standing balance-Leahy Scale: Fair                               Pertinent Vitals/Pain Pain Assessment:  Faces Faces Pain Scale: Hurts little more Pain Location: ribs with mobility Pain Intervention(s): Monitored during session;Premedicated before session;Repositioned;Limited activity within patient's tolerance    Home Living Family/patient expects to be discharged to:: Private residence Living Arrangements: Parent;Other relatives Available Help at Discharge: Family;Available 24 hours/day Type of Home: House Home Access: Stairs to enter     Home Layout: Two level;Bed/bath upstairs Home Equipment: Gilmer Mor - single point      Prior Function Level of Independence: Independent         Comments: driving and out in the community     Hand Dominance        Extremity/Trunk Assessment   Upper Extremity Assessment Upper Extremity Assessment: Overall WFL for tasks assessed    Lower Extremity Assessment Lower Extremity Assessment: Overall WFL for tasks assessed    Cervical / Trunk Assessment Cervical / Trunk Assessment: Normal  Communication   Communication: No difficulties  Cognition Arousal/Alertness: Awake/alert Behavior During Therapy: WFL for tasks assessed/performed Overall Cognitive Status: Within Functional Limits for tasks assessed                                 General Comments: pt wanted to push himself physically but reminded him not to overdo as he is going home today      General Comments      Exercises     Assessment/Plan    PT Assessment Patient  needs continued PT services  PT Problem List Decreased range of motion;Decreased activity tolerance;Decreased balance;Decreased mobility;Decreased coordination;Decreased knowledge of use of DME;Decreased safety awareness;Cardiopulmonary status limiting activity;Pain;Decreased skin integrity       PT Treatment Interventions DME instruction;Gait training;Stair training;Functional mobility training;Therapeutic activities;Therapeutic exercise;Balance training;Neuromuscular re-education;Patient/family  education    PT Goals (Current goals can be found in the Care Plan section)  Acute Rehab PT Goals Patient Stated Goal: to get up steps and get home again PT Goal Formulation: With patient/family Time For Goal Achievement: 11/12/16 Potential to Achieve Goals: Good    Frequency Min 3X/week   Barriers to discharge Inaccessible home environment has 17 steps to top of next floor to get to his bedroom    Co-evaluation               AM-PAC PT "6 Clicks" Daily Activity  Outcome Measure Difficulty turning over in bed (including adjusting bedclothes, sheets and blankets)?: A Little Difficulty moving from lying on back to sitting on the side of the bed? : A Little Difficulty sitting down on and standing up from a chair with arms (e.g., wheelchair, bedside commode, etc,.)?: A Little Help needed moving to and from a bed to chair (including a wheelchair)?: A Little Help needed walking in hospital room?: A Little Help needed climbing 3-5 steps with a railing? : A Little 6 Click Score: 18    End of Session Equipment Utilized During Treatment: Gait belt Activity Tolerance: Patient tolerated treatment well;Patient limited by pain Patient left: in chair;with call bell/phone within reach;with family/visitor present;with nursing/sitter in room Nurse Communication: Mobility status PT Visit Diagnosis: Difficulty in walking, not elsewhere classified (R26.2);Other (comment) (monitor vital signs)    Time: 5670-1410 PT Time Calculation (min) (ACUTE ONLY): 24 min   Charges:   PT Evaluation $PT Eval Low Complexity: 1 Low PT Treatments $Gait Training: 8-22 mins   PT G Codes:   PT G-Codes **NOT FOR INPATIENT CLASS** Functional Assessment Tool Used: AM-PAC 6 Clicks Basic Mobility    Ivar Drape 10/29/2016, 12:09 PM  Samul Dada, PT MS Acute Rehab Dept. Number: [facility] [id number] and [facility] [phone]

## 2016-10-29 NOTE — Telephone Encounter (Signed)
New message      TCM appt on 11-11-16 at 8am with Azalee Course per [facility]

## 2016-10-29 NOTE — Progress Notes (Signed)
Progress Note  Patient Name: Tommy Robbins Date of Encounter: 10/29/2016  Primary Cardiologist: Dr. Allyson Sabal  Subjective   Felling good. Less chest soreness. No dyspnea. Ambulated on stairs without any issue.   Inpatient Medications    Scheduled Meds: . aspirin  81 mg Oral Daily  . atorvastatin  80 mg Oral q1800  . clopidogrel  75 mg Oral Daily  . enoxaparin (LOVENOX) injection  40 mg Subcutaneous Q24H  . levothyroxine  88 mcg Oral QAC breakfast  . lidocaine  1 patch Transdermal Q24H  . lisinopril  20 mg Oral Daily  . mouth rinse  15 mL Mouth Rinse BID  . metoprolol tartrate  25 mg Oral BID  . morphine  30 mg Oral BID  . morphine  45 mg Oral Q0600  . pantoprazole  40 mg Oral Daily  . pneumococcal 23 valent vaccine  0.5 mL Intramuscular Tomorrow-1000  . sodium chloride flush  3 mL Intravenous Q12H  . spironolactone  25 mg Oral Daily   Continuous Infusions: . sodium chloride Stopped (10/27/16 0600)   PRN Meds: sodium chloride, acetaminophen, guaiFENesin, magnesium hydroxide, morphine injection, ondansetron (ZOFRAN) IV, sodium chloride flush, zolpidem   Vital Signs    Vitals:   10/28/16 0503 10/28/16 1240 10/28/16 2223 10/29/16 0459  BP: 139/77 108/76 128/76 128/78  Pulse: 65 64 81 66  Resp: 18 20 20 18   Temp: 98.6 F (37 C) 98.3 F (36.8 C) 97.6 F (36.4 C) 98.7 F (37.1 C)  TempSrc: Oral Oral Oral Oral  SpO2: 96% 99% 97% 96%  Weight: 157 lb 1.6 oz (71.3 kg)   157 lb 6.4 oz (71.4 kg)  Height:        Intake/Output Summary (Last 24 hours) at 10/29/16 0849 Last data filed at 10/29/16 0600  Gross per 24 hour  Intake             1080 ml  Output              200 ml  Net              880 ml   Filed Weights   10/27/16 1248 10/28/16 0503 10/29/16 0459  Weight: 158 lb 11.7 oz (72 kg) 157 lb 1.6 oz (71.3 kg) 157 lb 6.4 oz (71.4 kg)    Telemetry    NSR - Personally Reviewed  ECG    None today   Physical Exam   GEN: No acute distress.   Neck: No  JVD Cardiac: RRR, no murmurs, rubs, or gallops.  Respiratory: Clear to auscultation bilaterally. GI: Soft, nontender, non-distended  MS: No edema; No deformity. Neuro:  Nonfocal  Psych: Normal affect   Labs    Chemistry Recent Labs Lab 10/26/16 0430 10/28/16 0325 10/29/16 0332  NA 138 137 134*  K 3.4* 4.0 3.5  CL 108 105 101  CO2 25 24 25   GLUCOSE 127* 90 96  BUN 8 12 12   CREATININE 0.77 0.65 0.72  CALCIUM 8.3* 8.3* 8.4*  GFRNONAA >60 >60 >60  GFRAA >60 >60 >60  ANIONGAP 5 8 8      Hematology Recent Labs Lab 10/27/16 0207 10/28/16 0325 10/29/16 0332  WBC 9.4 7.0 5.6  RBC 3.88* 3.90* 3.76*  HGB 11.8* 11.6* 11.6*  HCT 33.4* 33.8* 32.8*  MCV 86.1 86.7 87.2  MCH 30.4 29.7 30.9  MCHC 35.3 34.3 35.4  RDW 12.9 12.8 12.9  PLT 101* 100* 104*    Cardiac Enzymes Recent Labs Lab 10/25/16 0104 10/25/16  0454 10/25/16 1140  TROPONINI 2.67* 11.45* 17.76*   No results for input(s): TROPIPOC in the last 168 hours.   BNPNo results for input(s): BNP, PROBNP in the last 168 hours.   DDimer No results for input(s): DDIMER in the last 168 hours.   Radiology    No results found.  Cardiac Studies   Echo: 10/25/16:  Study Conclusions  - Left ventricle: The cavity size was normal. There was mild concentric hypertrophy. Systolic function was moderately reduced. The estimated ejection fraction was in the range of 35% to 40%. Akinesis of the mid-apical anterolateral, mid-apical anteroseptal, inferoseptal, and apical myocardium. Hypokinesis of the inferior, anterior, and mid anterolateral wall. Doppler parameters are consistent with abnormal left ventricular relaxation (grade 1 diastolic dysfunction). Doppler parameters are consistent with high ventricular filling pressure. - Mitral valve: Transvalvular velocity was within the normal range. There was no evidence for stenosis. There was no regurgitation. - Left atrium: The atrium was mildly dilated. -  Right ventricle: The cavity size was normal. Wall thickness was normal. Systolic function was normal. - Atrial septum: No defect or patent foramen ovale was identified by color flow Doppler. - Tricuspid valve: There was mild regurgitation. - Pulmonary arteries: Systolic pressure was mildly increased. PA peak pressure: 41 mm Hg (S). - Global longitudinal strain -10.7% (abnormal).  IABP Insertion 10/24/16  LEFT HEART CATH AND CORONARY ANGIOGRAPHY  Conclusion     Prox RCA to Mid RCA lesion, 100 %stenosed.  Ost LAD to Prox LAD lesion, 95 %stenosed.  Mid LAD lesion, 100 %stenosed.  Ost 2nd Diag lesion, 95 %stenosed.  Ost 1st Mrg to 1st Mrg lesion, 99 %stenosed.  2nd Mrg lesion, 99 %stenosed.  Ost Cx to Prox Cx lesion, 60 %stenosed.  Mid Cx to Dist Cx lesion, 99 %stenosed.  The left ventricular ejection fraction is 35-45% by visual estimate.  There is moderate to severe left ventricular systolic dysfunction.  LV end diastolic pressure is low.    IMPRESSION:Unsuccessful attempt at proximal mid LAD intervention in the setting of a large anterolateral infarct. The patient's EF is in the 30-35% with anteroapical wall motion abnormality. This proximal LAD was high-grade and calcified and the entire LAD was fluoroscopically calcified. His mid LAD behaved as "a CTO" and I was unable to cross. I abandoned intervention elected to place an intra-aortic balloon pump for hemodynamic support given his borderline blood pressure. His LVEDP was low. He received intravenous fluid bolus. His potassium was low as well and he received potassium IV. He was electrocardiographically stable throughout the procedure. He is not a candidate for coronary artery bypass grafting since I do not think there is an LAD target. I recommended medical therapy at this time.  Patient Profile     71 y.o.malewithout known CAD but h/o HTN, COPD, chronic Hepatitis C, GERD, chronic lower back pain and tobacco  use 60 pack years presented with VF arrest and post CPR ST elevation c/w STEMI.  Assessment & Plan    1. Cardiac arrest with VF in setting of STEMI (outside hospital) - Peak of troponin 17.76. Cath showed 3V disease. Unsuccessful PCI of the LAD due to severe calcified CTO type lesion that could not be expanded. Occluded RCA, small OM1. Severe disease in distal LCx and OM2. Per Dr. Hoyle Barr note patient not felt to be a candidate for CABG due to poor distal targets in discussion with CT surgery. Not a candidate for further PCI.  - Plan to maximize medical therapy. Continue ASA, Plavix, BB,  ACE and spironolactone. Increased lisinopril to 20mg  qd today.  No driving for 6 months.   2. Rib Injury 2nd to CPR and chronic back pain - No fracture noted on 1 view CXR. Feels like prior rib fracture. If no improvement, can consider 2V CXR. F/u with PCP.  - On  home regimen of MS contin and lidoderm. Pain improved.   3. Acute systolic CHF/ICM - volume status stable. EDP was low during cath. Echo showed LVEF of 35-40%. No needed LifeVest. Needs repeat echo in 2-3 months. - Continue BB, ACE and Spironolactone.   4. HLD - 10/29/2016: Cholesterol 88; HDL 40; LDL Cholesterol 35; Triglycerides 64; VLDL 13. Continue statin.   5. HTN - stable and well controlled on current medications.   6. Social situation - able to walk on stair without any discomfort.   Home today with close follow up.    Signed, Manson Passey, PA  10/29/2016, 8:49 AM    Patient seen and examined and history reviewed. Agree with above findings and plan. Patient is doing better. Still has rib pain. Ambulated well yesterday. No dyspnea. VSS. Patient is stable for DC today. Will need repeat Echo in 2-3 months. If Echo on follow up shows EF < 35% would need to consider for ICD.   Peter [place], MDFACC 10/29/2016 9:41 AM

## 2016-10-29 NOTE — Progress Notes (Signed)
Pt walked with PT earlier. Discussed ex gl with pt and son. Reinforced daily wts, low sodium, smoking cessation. Voiced understanding. Ready for d/c. [id number] Ethelda Chick CES, ACSM 10:15 AM 10/29/2016

## 2016-11-03 ENCOUNTER — Telehealth (HOSPITAL_COMMUNITY): Payer: Self-pay

## 2016-11-03 NOTE — Telephone Encounter (Signed)
Patient insurances are active and benefits verified. Patient insurances are [employer] and [employer]. [employer] Medicare - no co-payment, no deductible, out of pocket $5500/$100 has been met, no co-insurance and no pre-authorization. Passport/reference [phone]. [employer]BS - no co-payment, deductible $1250/$532.2 has been met, out of pocket $4350/$711.50 has been met, no co-insurance and no pre-authorization. Passport/reference [phone].  Patient will be contacted and scheduled after their follow up appointment with the cardiologist on 11/11/16, upon review by [facility] RN navigator.

## 2016-11-06 ENCOUNTER — Encounter: Payer: Self-pay | Admitting: Family Medicine

## 2016-11-06 ENCOUNTER — Ambulatory Visit (INDEPENDENT_AMBULATORY_CARE_PROVIDER_SITE_OTHER): Payer: BC Managed Care – PPO | Admitting: Family Medicine

## 2016-11-06 VITALS — BP 122/74 | HR 61 | Resp 16 | Ht 70.0 in | Wt 162.8 lb

## 2016-11-06 DIAGNOSIS — I2510 Atherosclerotic heart disease of native coronary artery without angina pectoris: Secondary | ICD-10-CM | POA: Diagnosis not present

## 2016-11-06 DIAGNOSIS — E039 Hypothyroidism, unspecified: Secondary | ICD-10-CM

## 2016-11-06 DIAGNOSIS — I10 Essential (primary) hypertension: Secondary | ICD-10-CM

## 2016-11-06 DIAGNOSIS — F5101 Primary insomnia: Secondary | ICD-10-CM | POA: Diagnosis not present

## 2016-11-06 DIAGNOSIS — I5022 Chronic systolic (congestive) heart failure: Secondary | ICD-10-CM

## 2016-11-06 DIAGNOSIS — S51011D Laceration without foreign body of right elbow, subsequent encounter: Secondary | ICD-10-CM

## 2016-11-06 DIAGNOSIS — I5020 Unspecified systolic (congestive) heart failure: Secondary | ICD-10-CM | POA: Insufficient documentation

## 2016-11-06 MED ORDER — METOPROLOL TARTRATE 25 MG PO TABS: 25.0000 mg | ORAL_TABLET | Freq: Two times a day (BID) | ORAL | 1 refills | Status: DC

## 2016-11-06 MED ORDER — PANTOPRAZOLE SODIUM 40 MG PO TBEC
40.0000 mg | DELAYED_RELEASE_TABLET | Freq: Every day | ORAL | 2 refills | Status: DC
Start: 2016-11-06 — End: 2018-01-22

## 2016-11-06 MED ORDER — SPIRONOLACTONE 25 MG PO TABS: 25.0000 mg | ORAL_TABLET | Freq: Every day | ORAL | 2 refills | Status: DC

## 2016-11-06 MED ORDER — ATORVASTATIN CALCIUM 80 MG PO TABS: 80.0000 mg | ORAL_TABLET | Freq: Every day | ORAL | 2 refills | Status: DC

## 2016-11-06 MED ORDER — CLOPIDOGREL BISULFATE 75 MG PO TABS
75.0000 mg | ORAL_TABLET | Freq: Every day | ORAL | 2 refills | Status: DC
Start: 2016-11-06 — End: 2017-03-25

## 2016-11-06 MED ORDER — LISINOPRIL 20 MG PO TABS: 20.0000 mg | ORAL_TABLET | Freq: Every day | ORAL | 2 refills | Status: DC

## 2016-11-06 MED ORDER — METOPROLOL TARTRATE 25 MG PO TABS: 25.0000 mg | ORAL_TABLET | Freq: Two times a day (BID) | ORAL | 2 refills | Status: DC

## 2016-11-06 NOTE — Assessment & Plan Note (Signed)
Reviewed medications continue restoril He states he was given gabapentin to help him sleep as well but he only takes it every other day and only 100 mg he is not getting any pain benefit therefore recommended to discontinue this.

## 2016-11-06 NOTE — Patient Instructions (Addendum)
Review the medication list Okay to stop the gabapentin  Keep eye on your weights  F/U 3 months

## 2016-11-06 NOTE — Assessment & Plan Note (Signed)
Blood pressure controlled no change in medication. 

## 2016-11-06 NOTE — Assessment & Plan Note (Signed)
Recheck thyroid function studies he is taking his Synthroid as prescribed

## 2016-11-06 NOTE — Assessment & Plan Note (Addendum)
Coronary artery disease inoperable at this time. He is on maximum medications. His follow-up with cardiology next week and repeat his metabolic panel today. His medications will be sent to his mail order He has quit smoking

## 2016-11-06 NOTE — Progress Notes (Signed)
Subjective:    Patient ID: Tommy Robbins, male    DOB: 05-May-1945, 71 y.o.   MRN: [id number]  Patient presents for Hospitalization Follow-up Patient here for hospital follow-up. He sustained an ST elevated myocardial infarction in the left LAD. He has she presented with cardiac arrest and ventricular fibrillation. This occurred on August 4. He had Irritation performed which showed three-vessel disease there were unable to PCI to LAD and he was not felt to be a good candidate for CABG therefore they maximize medical therapy. He is on aspirin Plavix beta blocker ACE inhibitor or spironolactone. His lisinopril was increased 20 mg on August 9. He also has underlying systolic heart failure and cardiomyopathy they're planning to repeat an echo in 2-3 months to see if his ejection fraction improves if not he will be a candidate for an ICD  Hypothyroidism he is currently taking the Synthroid he is due for repeat thyroid function studies  He thinks has some soreness in his chest he has bruising and his ribs from the CPR. He also has a skin tear that occur when they were dragging him to start CPR but this is much improved and healing he is using his wound care supplies on this. His breathing is at baseline his appetite is starting to improve. He states that his weight is the same today as it was at home this morning at 162. He does not recall what it was when he first left the hospital but he has not noted any significant weight gain.  He will follow-up with his pain doctor next week he is on MS Contin, Dr. Vear Clock  Review Of Systems:  GEN- denies fatigue, fever, weight loss,weakness, recent illness HEENT- denies eye drainage, change in vision, nasal discharge, CVS- denies chest pain, palpitations RESP- denies SOB, cough, wheeze ABD- denies N/V, change in stools, abd pain GU- denies dysuria, hematuria, dribbling, incontinence MSK- +joint pain, +muscle aches, injury Neuro- denies headache, dizziness,  syncope, seizure activity       Objective:    BP 122/74   Pulse 61   Resp 16   Ht 5\' 10"  (1.778 m)   Wt 162 lb 12.8 oz (73.8 kg)   SpO2 95%   BMI 23.36 kg/m  GEN- NAD, alert and oriented x3 HEENT- PERRL, EOMI, non injected sclera, pink conjunctiva, MMM, oropharynx clear Neck- Supple, no thyromegaly CVS- RRR, no murmur, chest wall TTP bilat ribs  RESP-CTAB Skin - small skin tear right upper arm, bruises bilat  EXT- No edema Pulses- Radial 2+        Assessment & Plan:      Problem List Items Addressed This Visit      Unprioritized   Systolic CHF (HCC)    CONTINUE aldactone Recheck renal function F/u cardiology  Follow daily weights, he knows to call for any increase > 3lbs      Relevant Medications   spironolactone (ALDACTONE) 25 MG tablet   metoprolol tartrate (LOPRESSOR) 25 MG tablet   lisinopril (PRINIVIL,ZESTRIL) 20 MG tablet   atorvastatin (LIPITOR) 80 MG tablet   Insomnia    Reviewed medications continue restoril He states he was given gabapentin to help him sleep as well but he only takes it every other day and only 100 mg he is not getting any pain benefit therefore recommended to discontinue this.      Hypothyroidism - Primary    Recheck thyroid function studies he is taking his Synthroid as prescribed      Relevant  Medications   metoprolol tartrate (LOPRESSOR) 25 MG tablet   Other Relevant Orders   TSH   T4, free   T3, free   HYPERTENSION, BENIGN ESSENTIAL    Blood pressure controlled no change in medication      Relevant Medications   spironolactone (ALDACTONE) 25 MG tablet   metoprolol tartrate (LOPRESSOR) 25 MG tablet   lisinopril (PRINIVIL,ZESTRIL) 20 MG tablet   atorvastatin (LIPITOR) 80 MG tablet   CAD (coronary artery disease), native coronary artery    Coronary artery disease inoperable at this time. He is on maximum medications. His follow-up with cardiology next week and repeat his metabolic panel today. His medications will be  sent to his mail order He has quit smoking      Relevant Medications   spironolactone (ALDACTONE) 25 MG tablet   metoprolol tartrate (LOPRESSOR) 25 MG tablet   lisinopril (PRINIVIL,ZESTRIL) 20 MG tablet   atorvastatin (LIPITOR) 80 MG tablet   Other Relevant Orders   CBC with Differential/Platelet   Comprehensive metabolic panel    Other Visit Diagnoses    Skin tear of right elbow without complication, subsequent encounter       healing, using wound care as rpescribed wound clinic      Note: This dictation was prepared with Dragon dictation along with smaller phrase technology. Any transcriptional errors that result from this process are unintentional.

## 2016-11-06 NOTE — Assessment & Plan Note (Signed)
CONTINUE aldactone Recheck renal function F/u cardiology  Follow daily weights, he knows to call for any increase > 3lbs

## 2016-11-07 LAB — CBC WITH DIFFERENTIAL/PLATELET
BASOS ABS: 44 {cells}/uL (ref 0–200)
BASOS PCT: 1 %
EOS PCT: 5 %
Eosinophils Absolute: 220 cells/uL (ref 15–500)
HCT: 34.6 % — ABNORMAL LOW (ref 38.5–50.0)
Hemoglobin: 11.9 g/dL — ABNORMAL LOW (ref 13.0–17.0)
LYMPHS ABS: 924 {cells}/uL (ref 850–3900)
LYMPHS PCT: 21 %
MCH: 31 pg (ref 27.0–33.0)
MCHC: 34.4 g/dL (ref 32.0–36.0)
MCV: 90.1 fL (ref 80.0–100.0)
MONOS PCT: 13 %
MPV: 8.9 fL (ref 7.5–12.5)
Monocytes Absolute: 572 cells/uL (ref 200–950)
NEUTROS ABS: 2640 {cells}/uL (ref 1500–7800)
Neutrophils Relative %: 60 %
PLATELETS: 272 10*3/uL (ref 140–400)
RBC: 3.84 MIL/uL — ABNORMAL LOW (ref 4.20–5.80)
RDW: 13.4 % (ref 11.0–15.0)
WBC: 4.4 10*3/uL (ref 3.8–10.8)

## 2016-11-07 LAB — COMPREHENSIVE METABOLIC PANEL
ALBUMIN: 3.6 g/dL (ref 3.6–5.1)
ALT: 31 U/L (ref 9–46)
AST: 31 U/L (ref 10–35)
Alkaline Phosphatase: 123 U/L — ABNORMAL HIGH (ref 40–115)
BUN: 10 mg/dL (ref 7–25)
CALCIUM: 8.6 mg/dL (ref 8.6–10.3)
CHLORIDE: 101 mmol/L (ref 98–110)
CO2: 25 mmol/L (ref 20–32)
Creat: 0.77 mg/dL (ref 0.70–1.18)
GLUCOSE: 107 mg/dL — AB (ref 70–99)
POTASSIUM: 4.8 mmol/L (ref 3.5–5.3)
Sodium: 136 mmol/L (ref 135–146)
Total Bilirubin: 0.7 mg/dL (ref 0.2–1.2)
Total Protein: 6 g/dL — ABNORMAL LOW (ref 6.1–8.1)

## 2016-11-07 LAB — TSH: TSH: 4.55 mIU/L — ABNORMAL HIGH (ref 0.40–4.50)

## 2016-11-07 LAB — T3, FREE: T3 FREE: 2.8 pg/mL (ref 2.3–4.2)

## 2016-11-07 LAB — T4, FREE: FREE T4: 1.4 ng/dL (ref 0.8–1.8)

## 2016-11-11 ENCOUNTER — Ambulatory Visit (INDEPENDENT_AMBULATORY_CARE_PROVIDER_SITE_OTHER): Payer: BC Managed Care – PPO | Admitting: Physician Assistant

## 2016-11-11 ENCOUNTER — Encounter: Payer: Self-pay | Admitting: Physician Assistant

## 2016-11-11 VITALS — BP 127/71 | HR 49 | Ht 69.5 in | Wt 161.0 lb

## 2016-11-11 DIAGNOSIS — I2510 Atherosclerotic heart disease of native coronary artery without angina pectoris: Secondary | ICD-10-CM | POA: Diagnosis not present

## 2016-11-11 DIAGNOSIS — I10 Essential (primary) hypertension: Secondary | ICD-10-CM | POA: Diagnosis not present

## 2016-11-11 DIAGNOSIS — E039 Hypothyroidism, unspecified: Secondary | ICD-10-CM

## 2016-11-11 DIAGNOSIS — Z720 Tobacco use: Secondary | ICD-10-CM

## 2016-11-11 DIAGNOSIS — I5020 Unspecified systolic (congestive) heart failure: Secondary | ICD-10-CM

## 2016-11-11 MED ORDER — METOPROLOL SUCCINATE ER 50 MG PO TB24: 50.0000 mg | ORAL_TABLET | Freq: Every day | ORAL | 5 refills | Status: DC

## 2016-11-11 NOTE — Patient Instructions (Signed)
Medication Instructions:   STOP metoprolol tartrate   START metoprolol succinate 50mg  ONCE DAILY  Labwork:   none   Testing/Procedures:  Your physician has requested that you have an echocardiogram in November.   Echocardiography is a painless test that uses sound waves to create images of your heart. It provides your doctor with information about the size and shape of your heart and how well your heart's chambers and valves are working. This procedure takes approximately one hour. There are no restrictions for this procedure.    Follow-Up:  With Azalee Course PA in 1 month  With Dr. Allyson Sabal in 3 months (after your echocardiogram)   If you need a refill on your cardiac medications before your next appointment, please call your pharmacy.

## 2016-11-11 NOTE — Progress Notes (Signed)
Cardiology Office Note    Date:  11/13/2016   ID:  Tommy, Robbins May 03, 1945, MRN [id number]  PCP:  Dorena Bodo, PA-C  Cardiologist:  Dr. Allyson Sabal  Chief Complaint  Patient presents with  . Follow-up    seen for Dr. Allyson Sabal  . Transitions Of Care    14 day TCM appt    History of Present Illness:  Tommy Robbins is a 71 y.o. male with PMH of HTN, COPD, hypothyroidism, chronic hepatitis C, GERD, tobacco abuse who recently presented to the hospital on 10/24/2016 with VF arrest. Post CPR EKG demonstrated anterolateral STEMI. He underwent urgent cardiac catheterization which demonstrated poor LAD target, mid LAD CTO, RCA CTO and OM1 disease. Attempt was made at the proximal mid LAD, however unsuccessful. He required IABP. It was felt that there is no target in LAD for CABG. He was started on medical therapy with Plavix. Echocardiogram obtained on the following day showed EF 35-40%, akinesis of mid apical and anterolateral, mid apical anteroseptal, inferoseptal, apical myocardium, grade 1 diastolic dysfunction, PA peak pressure 41 mmHg. Peak troponin was 17.76. IABP was removed on 10/26/2016. Current plans to continue medical therapy and a repeat echo in 2-3 month. He was instructed no driving for six-month.  He is accompanied by his brother-in-law today. He continued to have right-sided chest soreness with body rotation. He also have a left shoulder pain worse with arm rotation as well. I asked him to discuss this with his primary care provider. He is still feeling weak but no significant shortness breath at rest. He has no sign of heart failure symptom on physical exam. He is currently on lisinopril and spironolactone for LV dysfunction, I will consolidate his short-acting metoprolol to Toprol-XL 50 mg daily. He will let us know if his heart rate drops to low. But otherwise currently his heart rate is 49 bpm and he is asymptomatic with this heart rate. If his heart rate does drop into the mid to low  40s, he may have dizziness, blurred vision or presyncope. We have discussed signs and symptoms he will need to watch out for. He will monitor his heart rate and let us know if he does have any symptoms. Otherwise, I will see him in one month for close outpatient observation. I will arrange a three-month echocardiogram before his visit with Dr. Allyson Sabal. He is currently on appropriate heart failure medications. When he returns, I will also consider checking a fasting lipid panel and liver function test.    Past Medical History:  Diagnosis Date  . Cataract   . Cellulitis and abscess of right leg 10/06/2016   right thigh  . Chronic low back pain   . Colon polyps   . Depression   . Diverticulosis   . GERD (gastroesophageal reflux disease)   . Hep C w/ coma, chronic (HCC)   . Hypertension   . Insomnia   . Thyroid disease    hypothyroid    Past Surgical History:  Procedure Laterality Date  . IABP INSERTION N/A 10/24/2016   Procedure: IABP Insertion;  Surgeon: Runell Gess, MD;  Location: [facility] INVASIVE CV LAB;  Service: Cardiovascular;  Laterality: N/A;  . JOINT REPLACEMENT Right 03/23/2008   Shoulder- Replacement  . LEFT HEART CATH AND CORONARY ANGIOGRAPHY N/A 10/24/2016   Procedure: LEFT HEART CATH AND CORONARY ANGIOGRAPHY;  Surgeon: Runell Gess, MD;  Location: [facility] INVASIVE CV LAB;  Service: Cardiovascular;  Laterality: N/A;    Current Medications: Outpatient  Medications Prior to Visit  Medication Sig Dispense Refill  . aspirin 81 MG chewable tablet Chew 1 tablet (81 mg total) by mouth daily. 30 tablet 11  . atorvastatin (LIPITOR) 80 MG tablet Take 1 tablet (80 mg total) by mouth daily at 6 PM. 90 tablet 2  . clopidogrel (PLAVIX) 75 MG tablet Take 1 tablet (75 mg total) by mouth daily. 90 tablet 2  . levothyroxine (SYNTHROID, LEVOTHROID) 88 MCG tablet TAKE 1 TABLET DAILY BEFORE BREAKFAST 90 tablet 2  . lidocaine (LIDODERM) 5 % USE 3 PATCHES ON THE SKIN DAILY  4  . lisinopril  (PRINIVIL,ZESTRIL) 20 MG tablet Take 1 tablet (20 mg total) by mouth daily. 90 tablet 2  . morphine (MS CONTIN) 15 MG 12 hr tablet Take 30-45 mg by mouth See admin instructions. Takes 45 mg in the morning, 30 mg in the afternoon and 30 mg at night  0  . morphine (MS CONTIN) 30 MG 12 hr tablet Take 30-45 mg by mouth See admin instructions. Takes 45 mg in the morning, 30 mg in the afternoon, and 30 mg at night    . Nutritional Supplements (JUICE PLUS FIBRE PO) Take 2 capsules by mouth 2 (two) times daily.    Marland Kitchen OVER THE COUNTER MEDICATION Take 1-2 tablets by mouth as needed (ALLERGIES). antihistamine    . pantoprazole (PROTONIX) 40 MG tablet Take 1 tablet (40 mg total) by mouth daily. 90 tablet 2  . spironolactone (ALDACTONE) 25 MG tablet Take 1 tablet (25 mg total) by mouth daily. 90 tablet 2  . temazepam (RESTORIL) 15 MG capsule Take 1 capsule (15 mg total) by mouth at bedtime as needed for sleep. 45 capsule 0  . traZODone (DESYREL) 50 MG tablet Take 50 mg by mouth at bedtime.   2  . metoprolol tartrate (LOPRESSOR) 25 MG tablet Take 1 tablet (25 mg total) by mouth 2 (two) times daily. 180 tablet 2   No facility-administered medications prior to visit.      Allergies:   Patient has no known allergies.   Social History   Social History  . Marital status: Divorced    Spouse name: N/A  . Number of children: N/A  . Years of education: N/A   Social History Main Topics  . Smoking status: Former Smoker    Packs/day: 1.00    Years: 50.00    Types: Cigarettes    Quit date: 06/10/2011  . Smokeless tobacco: Never Used  . Alcohol use Yes  . Drug use: No  . Sexual activity: Not Asked   Other Topics Concern  . None   Social History Narrative   Entered 12/2013:   Lives with his Sister, Niece, and Mother   They take turns caring for mother, who is 8   Quit smoking in 2013     Family History:  The patient's family history includes Arrhythmia in his sister; Arthritis in his father and  sister; Arthritis/Rheumatoid in his sister; CAD in his brother; Cancer in his sister; Hypertension in his sister; Osteoarthritis in his mother; Peripheral vascular disease in his sister; Scoliosis in his sister.   ROS:   Please see the history of present illness.    ROS All other systems reviewed and are negative.   PHYSICAL EXAM:   VS:  BP 127/71   Pulse (!) 49   Ht 5' 9.5" (1.765 m)   Wt 161 lb (73 kg)   BMI 23.43 kg/m    GEN: Well nourished, well developed, in  no acute distress  HEENT: normal  Neck: no JVD, carotid bruits, or masses Cardiac: RRR; no murmurs, rubs, or gallops,no edema  Respiratory:  clear to auscultation bilaterally, normal work of breathing GI: soft, nontender, nondistended, + BS MS: no deformity or atrophy  Skin: warm and dry, no rash Neuro:  Alert and Oriented x 3, Strength and sensation are intact Psych: euthymic mood, full affect  Wt Readings from Last 3 Encounters:  11/11/16 161 lb (73 kg)  11/06/16 162 lb 12.8 oz (73.8 kg)  10/29/16 157 lb 6.4 oz (71.4 kg)      Studies/Labs Reviewed:   EKG:  EKG is ordered today.  The ekg ordered today demonstrates Sinus bradycardia, poor R wave progression in anterior lead, T wave inversion in the lateral leads  Recent Labs: 10/27/2016: Magnesium 1.8 11/06/2016: ALT 31; BUN 10; Creat 0.77; Hemoglobin 11.9; Platelets 272; Potassium 4.8; Sodium 136; TSH 4.55   Lipid Panel    Component Value Date/Time   CHOL 88 10/29/2016 0332   TRIG 64 10/29/2016 0332   HDL 40 (L) 10/29/2016 0332   CHOLHDL 2.2 10/29/2016 0332   VLDL 13 10/29/2016 0332   LDLCALC 35 10/29/2016 0332    Additional studies/ records that were reviewed today include:   Cath 10/24/2016 Conclusion     Prox RCA to Mid RCA lesion, 100 %stenosed.  Ost LAD to Prox LAD lesion, 95 %stenosed.  Mid LAD lesion, 100 %stenosed.  Ost 2nd Diag lesion, 95 %stenosed.  Ost 1st Mrg to 1st Mrg lesion, 99 %stenosed.  2nd Mrg lesion, 99 %stenosed.  Ost Cx  to Prox Cx lesion, 60 %stenosed.  Mid Cx to Dist Cx lesion, 99 %stenosed.  The left ventricular ejection fraction is 35-45% by visual estimate.  There is moderate to severe left ventricular systolic dysfunction.  LV end diastolic pressure is low.   IMPRESSION: Unsuccessful attempt at proximal mid LAD intervention in the setting of a large anterolateral infarct. The patient's EF is in the 30-35% with anteroapical wall motion abnormality. This proximal LAD was high-grade and calcified and the entire LAD was fluoroscopically calcified. His mid LAD behaved as "a CTO" and I was unable to cross. I abandoned intervention elected to place an intra-aortic balloon pump for hemodynamic support given his borderline blood pressure. His LVEDP was low. He received intravenous fluid bolus. His potassium was low as well and he received potassium IV. He was electrocardiographically stable throughout the procedure. He is not a candidate for coronary artery bypass grafting since I do not think there is an LAD target. I recommended medical therapy at this time.   Echo 10/25/2016 LV EF: 35% -   40%  ------------------------------------------------------------------- Indications:      CAD of native vessels 414.01.  Study Conclusions  - Left ventricle: The cavity size was normal. There was mild   concentric hypertrophy. Systolic function was moderately reduced.   The estimated ejection fraction was in the range of 35% to 40%.   Akinesis of the mid-apical anterolateral, mid-apical   anteroseptal, inferoseptal, and apical myocardium. Hypokinesis of   the inferior, anterior, and mid anterolateral wall. Doppler   parameters are consistent with abnormal left ventricular   relaxation (grade 1 diastolic dysfunction). Doppler parameters   are consistent with high ventricular filling pressure. - Mitral valve: Transvalvular velocity was within the normal range.   There was no evidence for stenosis. There was no  regurgitation. - Left atrium: The atrium was mildly dilated. - Right ventricle: The cavity size was  normal. Wall thickness was   normal. Systolic function was normal. - Atrial septum: No defect or patent foramen ovale was identified   by color flow Doppler. - Tricuspid valve: There was mild regurgitation. - Pulmonary arteries: Systolic pressure was mildly increased. PA   peak pressure: 41 mm Hg (S). - Global longitudinal strain -10.7% (abnormal).  ASSESSMENT:    1. Coronary artery disease involving native coronary artery of native heart without angina pectoris   2. Systolic congestive heart failure, unspecified HF chronicity (HCC)   3. Essential hypertension   4. Hypothyroidism, unspecified type   5. Tobacco abuse      PLAN:  In order of problems listed above:  1. CAD: Recent VF arrest, subsequent emergent cardiac catheterization had unsuccessful attempt at intervening the LAD due to appearance of possible CTO. He does have a sore spot on his right chest that is worse with body rotation, he also have a pain on his left shoulder that is worse with arm rotation. I think those are musculoskeletal pain. Discussed the importance of tobacco cessation at the compliance with DAPT. He is currently on aspirin and Plavix.  2. Chronic systolic heart failure: EF 35-40% by echocardiogram. He is currently on lisinopril, spironolactone and metoprolol tartrate. We'll consolidate metoprolol tartrate 25 mg twice a day to Toprol-XL 50 mg daily. He appears to be euvolemic on physical exam.  3. Bradycardia: We did consolidate his metoprolol titrate to Toprol-XL, however his heart rate is bradycardic in the office today. I asked him to monitor his heart rate after switching to the Peale-acting Toprol XL. He is completely asymptomatic at this time.  4. Hypertension: Blood pressure well controlled  5. Hypothyroidism: On Synthroid    Medication Adjustments/Labs and Tests Ordered: Current medicines are  reviewed at length with the patient today.  Concerns regarding medicines are outlined above.  Medication changes, Labs and Tests ordered today are listed in the Patient Instructions below. Patient Instructions  Medication Instructions:   STOP metoprolol tartrate   START metoprolol succinate 50mg  ONCE DAILY  Labwork:   none   Testing/Procedures:  Your physician has requested that you have an echocardiogram in November.   Echocardiography is a painless test that uses sound waves to create images of your heart. It provides your doctor with information about the size and shape of your heart and how well your heart's chambers and valves are working. This procedure takes approximately one hour. There are no restrictions for this procedure.    Follow-Up:  With Azalee Course PA in 1 month  With Dr. Allyson Sabal in 3 months (after your echocardiogram)   If you need a refill on your cardiac medications before your next appointment, please call your pharmacy.      Ramond Dial, [place]  11/13/2016 7:37 AM    [facility] [address] [place], [place], [place]  [address] Phone: [phone]; Fax: [phone]

## 2016-11-13 ENCOUNTER — Encounter: Payer: Self-pay | Admitting: Physician Assistant

## 2016-11-15 ENCOUNTER — Other Ambulatory Visit: Payer: Self-pay | Admitting: Physician Assistant

## 2016-11-16 ENCOUNTER — Telehealth: Payer: Self-pay | Admitting: Physician Assistant

## 2016-11-16 NOTE — Telephone Encounter (Signed)
Refill appropriate 

## 2016-11-16 NOTE — Telephone Encounter (Signed)
Pt was recently taking potassium but after hospital encounter was no longer on it can he continue to not take it or does he need to start taking it again?

## 2016-11-17 NOTE — Telephone Encounter (Signed)
Spoke with patient hospital took him off his potassium pills level is within range so patient was instructed to stay off potassium pills for now

## 2016-11-17 NOTE — Addendum Note (Signed)
Addended by: Ladell Heads on: 11/17/2016 03:02 PM   Modules accepted: Orders

## 2016-11-18 NOTE — Telephone Encounter (Signed)
Stay Off the potassium pill To reassure pt-- tell him --Potassium level was checked at lab at [facility] here 11/06/16.

## 2016-11-19 ENCOUNTER — Other Ambulatory Visit (HOSPITAL_COMMUNITY): Payer: Self-pay

## 2016-11-25 ENCOUNTER — Telehealth (HOSPITAL_COMMUNITY): Payer: Self-pay

## 2016-11-25 NOTE — Telephone Encounter (Signed)
I called patient to discuss scheduling for cardiac rehab. Patient informed me that he is not able to participate in cardiac rehab due to transportation. Patient is not able to drive for 6 months. Patient given cardiac rehab contact information to call when ready to schedule. Referral closed until further notice.

## 2016-11-30 ENCOUNTER — Other Ambulatory Visit: Payer: Self-pay | Admitting: Physician Assistant

## 2016-11-30 MED ORDER — METOPROLOL SUCCINATE ER 50 MG PO TB24
50.0000 mg | ORAL_TABLET | Freq: Every day | ORAL | 3 refills | Status: DC
Start: 2016-11-30 — End: 2017-12-04

## 2016-11-30 NOTE — Telephone Encounter (Signed)
New message        *STAT* If patient is at the pharmacy, call can be transferred to refill team.   1. Which medications need to be refilled? (please list name of each medication and dose if known)  Metoprolol succinate 50mg   2. Which pharmacy/location (including street and city if local pharmacy) is medication to be sent to?  [facility]. Do they need a 30 day or 90 day supply? 90 day supply

## 2016-11-30 NOTE — Telephone Encounter (Signed)
Rx(s) sent to pharmacy electronically.  

## 2016-12-09 ENCOUNTER — Ambulatory Visit (INDEPENDENT_AMBULATORY_CARE_PROVIDER_SITE_OTHER): Payer: BC Managed Care – PPO | Admitting: Physician Assistant

## 2016-12-09 ENCOUNTER — Encounter: Payer: Self-pay | Admitting: Physician Assistant

## 2016-12-09 VITALS — BP 100/62 | HR 60 | Ht 69.0 in | Wt 162.0 lb

## 2016-12-09 DIAGNOSIS — I5022 Chronic systolic (congestive) heart failure: Secondary | ICD-10-CM | POA: Diagnosis not present

## 2016-12-09 DIAGNOSIS — J449 Chronic obstructive pulmonary disease, unspecified: Secondary | ICD-10-CM | POA: Diagnosis not present

## 2016-12-09 DIAGNOSIS — I2510 Atherosclerotic heart disease of native coronary artery without angina pectoris: Secondary | ICD-10-CM

## 2016-12-09 DIAGNOSIS — E039 Hypothyroidism, unspecified: Secondary | ICD-10-CM

## 2016-12-09 DIAGNOSIS — Z79899 Other long term (current) drug therapy: Secondary | ICD-10-CM

## 2016-12-09 DIAGNOSIS — I10 Essential (primary) hypertension: Secondary | ICD-10-CM | POA: Diagnosis not present

## 2016-12-09 NOTE — Patient Instructions (Signed)
Medication Instructions: Your physician recommends that you continue on your current medications as directed. Please refer to the Current Medication list given to you today.  If you need a refill on your cardiac medications before your next appointment, please call your pharmacy.   Labwork: Your physician recommends that you return for lab work in next few weeks for a FASTING lipid and LFT.   Follow-Up: Your physician wants you to keep your appointment on 02/10/17 with Dr. Allyson Sabal  Thank you for choosing Heartcare at [facility]!!

## 2016-12-09 NOTE — Progress Notes (Signed)
Cardiology Office Note    Date:  12/09/2016   ID:  Tommy Robbins, DOB October 25, 1945, MRN [id number]  PCP:  Tommy Bodo, PA-C  Cardiologist:  Dr. Allyson Robbins  Chief Complaint  Patient presents with  . Follow-up    occassional SOB    History of Present Illness:  Tommy Robbins is a 71 y.o. male with PMH of HTN, COPD, hypothyroidism, chronic hepatitis C, GERD, tobacco abuse who recently presented to the hospital on 10/24/2016 with VF arrest. Post CPR EKG demonstrated anterolateral STEMI. He underwent urgent cardiac catheterization which demonstrated poor LAD target, mid LAD CTO, RCA CTO and OM1 disease. Attempt was made at the proximal mid LAD, however unsuccessful. He required IABP. It was felt that there is no target in LAD for CABG. He was started on medical therapy with Plavix. Echocardiogram obtained on the following day showed EF 35-40%, akinesis of mid apical and anterolateral, mid apical anteroseptal, inferoseptal, apical myocardium, grade 1 diastolic dysfunction, PA peak pressure 41 mmHg. Peak troponin was 17.76. IABP was removed on 10/26/2016. Current plans to continue medical therapy and repeat echo in 2-3 month. He was instructed no driving for six-month.  I last saw the patient on 11/09/2016, he'll continue to have some right-sided chest soreness with body rotation. He also had left shoulder pain worse with arm rotations well. He was on lisinopril and spironolactone for LV dysfunction. I consolidated his short-acting metoprolol to Tommy Robbins-acting metoprolol. His heart rate he was 49 at the time. Patient presents today along with his sister. He has been doing well, denying any significant chest discomfort. He continued to have some degree of weakness. His blood pressure is borderline today, based on the blood pressure diary brought by his sister, his systolic blood pressure ranges between 100 -120s at home. Heart rate mainly stays in the 50s with occasional heart rate into the 60s. He denies any  significant dizziness at this point. I will continue on the current medication, he has an echocardiogram scheduled in November and the visit scheduled with Dr. Allyson Robbins right afterward.   Past Medical History:  Diagnosis Date  . Cataract   . Cellulitis and abscess of right leg 10/06/2016   right thigh  . Chronic low back pain   . Colon polyps   . Depression   . Diverticulosis   . GERD (gastroesophageal reflux disease)   . Hep C w/ coma, chronic (HCC)   . Hypertension   . Insomnia   . Thyroid disease    hypothyroid    Past Surgical History:  Procedure Laterality Date  . IABP INSERTION N/A 10/24/2016   Procedure: IABP Insertion;  Surgeon: Runell Gess, MD;  Location: [facility] INVASIVE CV LAB;  Service: Cardiovascular;  Laterality: N/A;  . JOINT REPLACEMENT Right 03/23/2008   Shoulder- Replacement  . LEFT HEART CATH AND CORONARY ANGIOGRAPHY N/A 10/24/2016   Procedure: LEFT HEART CATH AND CORONARY ANGIOGRAPHY;  Surgeon: Runell Gess, MD;  Location: [facility] INVASIVE CV LAB;  Service: Cardiovascular;  Laterality: N/A;    Current Medications: Outpatient Medications Prior to Visit  Medication Sig Dispense Refill  . atorvastatin (LIPITOR) 80 MG tablet Take 1 tablet (80 mg total) by mouth daily at 6 PM. 90 tablet 2  . clopidogrel (PLAVIX) 75 MG tablet Take 1 tablet (75 mg total) by mouth daily. 90 tablet 2  . levothyroxine (SYNTHROID, LEVOTHROID) 88 MCG tablet TAKE 1 TABLET DAILY BEFORE BREAKFAST 90 tablet 2  . lidocaine (LIDODERM) 5 % USE 3 PATCHES  ON THE SKIN DAILY  4  . lisinopril (PRINIVIL,ZESTRIL) 20 MG tablet Take 1 tablet (20 mg total) by mouth daily. 90 tablet 2  . metoprolol succinate (TOPROL-XL) 50 MG 24 hr tablet Take 1 tablet (50 mg total) by mouth daily. Take with or immediately following a meal. 90 tablet 3  . morphine (MS CONTIN) 15 MG 12 hr tablet Take 30-45 mg by mouth See admin instructions. Takes 45 mg in the morning, 30 mg in the afternoon and 30 mg at night  0  . morphine (MS  CONTIN) 30 MG 12 hr tablet Take 30-45 mg by mouth See admin instructions. Takes 45 mg in the morning, 30 mg in the afternoon, and 30 mg at night    . Nutritional Supplements (JUICE PLUS FIBRE PO) Take 2 capsules by mouth 2 (two) times daily.    Marland Kitchen OVER THE COUNTER MEDICATION Take 1-2 tablets by mouth as needed (ALLERGIES). antihistamine    . pantoprazole (PROTONIX) 40 MG tablet Take 1 tablet (40 mg total) by mouth daily. (Patient taking differently: Take 40 mg by mouth 4 (four) times a week. ) 90 tablet 2  . spironolactone (ALDACTONE) 25 MG tablet Take 1 tablet (25 mg total) by mouth daily. 90 tablet 2  . temazepam (RESTORIL) 15 MG capsule Take 1 capsule (15 mg total) by mouth at bedtime as needed for sleep. 45 capsule 0  . traZODone (DESYREL) 50 MG tablet Take 50 mg by mouth at bedtime.   2  . aspirin 81 MG chewable tablet Chew 1 tablet (81 mg total) by mouth daily. (Patient not taking: Reported on 12/09/2016) 30 tablet 11  . CHANTIX 1 MG tablet TAKE 1 TABLET TWICE A DAY (Patient not taking: Reported on 12/09/2016) 60 tablet 5   No facility-administered medications prior to visit.      Allergies:   Patient has no known allergies.   Social History   Social History  . Marital status: Divorced    Spouse name: N/A  . Number of children: N/A  . Years of education: N/A   Social History Main Topics  . Smoking status: Former Smoker    Packs/day: 1.00    Years: 50.00    Types: Cigarettes    Quit date: 06/10/2011  . Smokeless tobacco: Never Used  . Alcohol use Yes  . Drug use: No  . Sexual activity: Not Asked   Other Topics Concern  . None   Social History Narrative   Entered 12/2013:   Lives with his Sister, Niece, and Mother   They take turns caring for mother, who is 77   Quit smoking in 2013     Family History:  The patient's family history includes Arrhythmia in his sister; Arthritis in his father and sister; Arthritis/Rheumatoid in his sister; CAD in his brother; Cancer in his  sister; Hypertension in his sister; Osteoarthritis in his mother; Peripheral vascular disease in his sister; Scoliosis in his sister.   ROS:   Please see the history of present illness.    ROS All other systems reviewed and are negative.   PHYSICAL EXAM:   VS:  BP 100/62   Pulse 60   Ht  (1.753 m)   Wt 162 lb (73.5 kg)   BMI 23.92 kg/m    GEN: Well nourished, well developed, in no acute distress  HEENT: normal  Neck: no JVD, carotid bruits, or masses Cardiac: RRR; no murmurs, rubs, or gallops,no edema  Respiratory:  clear to auscultation bilaterally, normal  work of breathing GI: soft, nontender, nondistended, + BS MS: no deformity or atrophy  Skin: warm and dry, no rash Neuro:  Alert and Oriented x 3, Strength and sensation are intact Psych: euthymic mood, full affect  Wt Readings from Last 3 Encounters:  12/09/16 162 lb (73.5 kg)  11/11/16 161 lb (73 kg)  11/06/16 162 lb 12.8 oz (73.8 kg)      Studies/Labs Reviewed:   EKG:  EKG is not ordered today.    Recent Labs: 10/27/2016: Magnesium 1.8 11/06/2016: ALT 31; BUN 10; Creat 0.77; Hemoglobin 11.9; Platelets 272; Potassium 4.8; Sodium 136; TSH 4.55   Lipid Panel    Component Value Date/Time   CHOL 88 10/29/2016 0332   TRIG 64 10/29/2016 0332   HDL 40 (L) 10/29/2016 0332   CHOLHDL 2.2 10/29/2016 0332   VLDL 13 10/29/2016 0332   LDLCALC 35 10/29/2016 0332    Additional studies/ records that were reviewed today include:   Cath 10/24/2016 Conclusion     Prox RCA to Mid RCA lesion, 100 %stenosed.  Ost LAD to Prox LAD lesion, 95 %stenosed.  Mid LAD lesion, 100 %stenosed.  Ost 2nd Diag lesion, 95 %stenosed.  Ost 1st Mrg to 1st Mrg lesion, 99 %stenosed.  2nd Mrg lesion, 99 %stenosed.  Ost Cx to Prox Cx lesion, 60 %stenosed.  Mid Cx to Dist Cx lesion, 99 %stenosed.  The left ventricular ejection fraction is 35-45% by visual estimate.  There is moderate to severe left ventricular systolic  dysfunction.  LV end diastolic pressure is low.   IMPRESSION:Unsuccessful attempt at proximal mid LAD intervention in the setting of a large anterolateral infarct. The patient's EF is in the 30-35% with anteroapical wall motion abnormality. This proximal LAD was high-grade and calcified and the entire LAD was fluoroscopically calcified. His mid LAD behaved as "a CTO" and I was unable to cross. I abandoned intervention elected to place an intra-aortic balloon pump for hemodynamic support given his borderline blood pressure. His LVEDP was low. He received intravenous fluid bolus. His potassium was low as well and he received potassium IV. He was electrocardiographically stable throughout the procedure. He is not a candidate for coronary artery bypass grafting since I do not think there is an LAD target. I recommended medical therapy at this time.   Echo 10/25/2016 LV EF: 35% - 40%  ------------------------------------------------------------------- Indications: CAD of native vessels 414.01.  Study Conclusions  - Left ventricle: The cavity size was normal. There was mild concentric hypertrophy. Systolic function was moderately reduced. The estimated ejection fraction was in the range of 35% to 40%. Akinesis of the mid-apical anterolateral, mid-apical anteroseptal, inferoseptal, and apical myocardium. Hypokinesis of the inferior, anterior, and mid anterolateral wall. Doppler parameters are consistent with abnormal left ventricular relaxation (grade 1 diastolic dysfunction). Doppler parameters are consistent with high ventricular filling pressure. - Mitral valve: Transvalvular velocity was within the normal range. There was no evidence for stenosis. There was no regurgitation. - Left atrium: The atrium was mildly dilated. - Right ventricle: The cavity size was normal. Wall thickness was normal. Systolic function was normal. - Atrial septum: No defect or patent  foramen ovale was identified by color flow Doppler. - Tricuspid valve: There was mild regurgitation. - Pulmonary arteries: Systolic pressure was mildly increased. PA peak pressure: 41 mm Hg (S). - Global longitudinal strain -10.7% (abnormal).     ASSESSMENT:    1. Coronary artery disease involving native coronary artery of native heart without angina pectoris   2.  Medication management   3. Essential hypertension   4. Chronic obstructive pulmonary disease, unspecified COPD type (HCC)   5. Hypothyroidism, unspecified type   6. Chronic systolic heart failure (HCC)      PLAN:  In order of problems listed above:  1. CAD: Complicated by V. fib arrest during recent anterior STEMI. Unsuccessful attempt and intervening the LAD due to appearance of possible CTO. Continue aspirin and the Plavix for now. Continue high-dose statin, fasting lipid panel and LFTs in 6-8 weeks.  2. Ischemic cardiomyopathy: Continue lisinopril, Toprol-XL and his spironolactone. Plan for repeat echocardiogram prior to next visit.  3. Hypertension: Blood pressure stable  4. Hypothyroidism: Continue Synthroid    Medication Adjustments/Labs and Tests Ordered: Current medicines are reviewed at length with the patient today.  Concerns regarding medicines are outlined above.  Medication changes, Labs and Tests ordered today are listed in the Patient Instructions below. Patient Instructions  Medication Instructions: Your physician recommends that you continue on your current medications as directed. Please refer to the Current Medication list given to you today.  If you need a refill on your cardiac medications before your next appointment, please call your pharmacy.   Labwork: Your physician recommends that you return for lab work in next few weeks for a FASTING lipid and LFT.   Follow-Up: Your physician wants you to keep your appointment on 02/10/17 with Dr. Allyson Robbins  Thank you for choosing [facility] at  [facility]!!         Signed, Azalee Course, [place]  12/09/2016 11:04 PM    [facility] [address] [place], [place], [place]  [address] Phone: [phone]; Fax: [phone]

## 2017-01-20 ENCOUNTER — Telehealth: Payer: Self-pay | Admitting: *Deleted

## 2017-01-20 ENCOUNTER — Ambulatory Visit (INDEPENDENT_AMBULATORY_CARE_PROVIDER_SITE_OTHER): Payer: BC Managed Care – PPO | Admitting: Physician Assistant

## 2017-01-20 ENCOUNTER — Encounter: Payer: Self-pay | Admitting: Physician Assistant

## 2017-01-20 VITALS — BP 124/78 | HR 65 | Ht 70.0 in | Wt 167.6 lb

## 2017-01-20 DIAGNOSIS — I10 Essential (primary) hypertension: Secondary | ICD-10-CM | POA: Diagnosis not present

## 2017-01-20 DIAGNOSIS — E039 Hypothyroidism, unspecified: Secondary | ICD-10-CM

## 2017-01-20 DIAGNOSIS — R0789 Other chest pain: Secondary | ICD-10-CM | POA: Diagnosis not present

## 2017-01-20 DIAGNOSIS — I2510 Atherosclerotic heart disease of native coronary artery without angina pectoris: Secondary | ICD-10-CM

## 2017-01-20 DIAGNOSIS — Z720 Tobacco use: Secondary | ICD-10-CM

## 2017-01-20 LAB — HEPATIC FUNCTION PANEL
ALBUMIN: 3.6 g/dL (ref 3.5–4.8)
ALT: 48 IU/L — ABNORMAL HIGH (ref 0–44)
AST: 47 IU/L — AB (ref 0–40)
Alkaline Phosphatase: 127 IU/L — ABNORMAL HIGH (ref 39–117)
BILIRUBIN TOTAL: 1.1 mg/dL (ref 0.0–1.2)
Bilirubin, Direct: 0.53 mg/dL — ABNORMAL HIGH (ref 0.00–0.40)
TOTAL PROTEIN: 5.8 g/dL — AB (ref 6.0–8.5)

## 2017-01-20 LAB — LIPID PANEL
CHOLESTEROL TOTAL: 88 mg/dL — AB (ref 100–199)
Chol/HDL Ratio: 1.7 ratio (ref 0.0–5.0)
HDL: 51 mg/dL (ref >39)
LDL CALC: 26 mg/dL (ref 0–99)
Triglycerides: 55 mg/dL (ref 0–149)
VLDL Cholesterol Cal: 11 mg/dL (ref 5–40)

## 2017-01-20 MED ORDER — ISOSORBIDE MONONITRATE ER 30 MG PO TB24: 30.0000 mg | ORAL_TABLET | Freq: Every day | ORAL | 3 refills | Status: DC

## 2017-01-20 MED ORDER — FUROSEMIDE 20 MG PO TABS: 20.0000 mg | ORAL_TABLET | Freq: Every day | ORAL | 3 refills | Status: AC | PRN

## 2017-01-20 MED ORDER — NITROGLYCERIN 0.4 MG SL SUBL: 0.4000 mg | SUBLINGUAL_TABLET | SUBLINGUAL | 3 refills | Status: AC | PRN

## 2017-01-20 NOTE — Telephone Encounter (Signed)
The patient came in for labs today. He stated that he would like to have an earlier appointment. One has been made for today at 2 with Azalee Course, PA.   He complains of shortness of breath for the past 3 evenings. It only occurs in the evening. He denies chest pain. He stated that he had a heart attack 2 months ago but this does not feel the same. He stated that his blood pressure was 114/71.   He has an appointment for an ECHO on 11/14 and follow up with Dr. Allyson Sabal on 11/21.

## 2017-01-20 NOTE — Progress Notes (Signed)
Cardiology Office Note    Date:  01/22/2017   ID:  Tommy Robbins, DOB 1945-11-18, MRN [id number]  PCP:  Dorena Bodo, PA-C  Cardiologist:  Dr. Allyson Sabal  Chief Complaint  Patient presents with  . Follow-up    seen for Dr. Allyson Sabal    History of Present Illness:  Tommy Robbins is a 71 y.o. male with PMH of HTN, COPD, hypothyroidism, chronic hepatitis C, GERD, tobacco abuse who recently presented to the hospital on 10/24/2016 with VF arrest. Post CPR EKG demonstrated anterolateral STEMI.He underwent urgent cardiac catheterization which demonstrated poor LAD target, mid LAD CTO, RCA CTO and OM1 disease. Attempt was made at the proximal mid LAD, however unsuccessful. He required IABP. It wasfelt that there is no target inLAD for CABG. He was started on medical therapy with Plavix. Echocardiogram obtained on the following day showed EF 35-40%, akinesis of mid apical and anterolateral, mid apical anteroseptal, inferoseptal, apical myocardium, grade 1 diastolic dysfunction, PA peak pressure 41 mmHg. Peak troponin was 17.76. IABP was removed on 10/26/2016. Current plans to continue medical therapy and repeat echo in 2-3 month. He was instructed no driving for six-month.  I saw the patient on 11/09/2016, he continued to have some right-sided chest soreness with body rotation. He also had left shoulder pain worse with arm rotations well. He was on lisinopril and spironolactone for LV dysfunction. I consolidated his short-acting metoprolol to Coulthard-acting metoprolol. I saw the patient again on 12/09/2016, he was doing well at the time. He presents today for cardiology office visit. He has been having some orthopnea and episodes and also occasional chest discomfort as well. He says his chest discomfort is somewhat different from his previous chest pain. He does notice it more at rest. I will add Imdur 30 mg daily for medical treatment. I have also given him a sublingual nitroglycerin. Although he does not have lower  extremity edema, he has been mildly intermittent crackle in the base of bilateral lung. I do not think he requires scheduled diuretic, however I have given him PRN dose of Lasix 20 mg. He will take Lasix today and tomorrow and change to as needed after that.   Past Medical History:  Diagnosis Date  . Cataract   . Cellulitis and abscess of right leg 10/06/2016   right thigh  . Chronic low back pain   . Colon polyps   . Depression   . Diverticulosis   . GERD (gastroesophageal reflux disease)   . Hep C w/ coma, chronic (HCC)   . Hypertension   . Insomnia   . Thyroid disease    hypothyroid    Past Surgical History:  Procedure Laterality Date  . IABP INSERTION N/A 10/24/2016   Procedure: IABP Insertion;  Surgeon: Runell Gess, MD;  Location: [facility] INVASIVE CV LAB;  Service: Cardiovascular;  Laterality: N/A;  . JOINT REPLACEMENT Right 03/23/2008   Shoulder- Replacement  . LEFT HEART CATH AND CORONARY ANGIOGRAPHY N/A 10/24/2016   Procedure: LEFT HEART CATH AND CORONARY ANGIOGRAPHY;  Surgeon: Runell Gess, MD;  Location: [facility] INVASIVE CV LAB;  Service: Cardiovascular;  Laterality: N/A;    Current Medications: Outpatient Medications Prior to Visit  Medication Sig Dispense Refill  . aspirin EC 81 MG tablet Take 81 mg by mouth daily.    Marland Kitchen atorvastatin (LIPITOR) 80 MG tablet Take 1 tablet (80 mg total) by mouth daily at 6 PM. 90 tablet 2  . clopidogrel (PLAVIX) 75 MG tablet Take 1 tablet (  75 mg total) by mouth daily. 90 tablet 2  . levothyroxine (SYNTHROID, LEVOTHROID) 88 MCG tablet TAKE 1 TABLET DAILY BEFORE BREAKFAST 90 tablet 2  . lidocaine (LIDODERM) 5 % USE 3 PATCHES ON THE SKIN DAILY  4  . lisinopril (PRINIVIL,ZESTRIL) 20 MG tablet Take 1 tablet (20 mg total) by mouth daily. 90 tablet 2  . metoprolol succinate (TOPROL-XL) 50 MG 24 hr tablet Take 1 tablet (50 mg total) by mouth daily. Take with or immediately following a meal. 90 tablet 3  . morphine (MS CONTIN) 15 MG 12 hr tablet  Take 30-45 mg by mouth See admin instructions. Takes 45 mg in the morning, 30 mg in the afternoon and 30 mg at night  0  . morphine (MS CONTIN) 30 MG 12 hr tablet Take 30-45 mg by mouth See admin instructions. Takes 45 mg in the morning, 30 mg in the afternoon, and 30 mg at night    . Nutritional Supplements (JUICE PLUS FIBRE PO) Take 2 capsules by mouth 2 (two) times daily.    Marland Kitchen OVER THE COUNTER MEDICATION Take 1-2 tablets by mouth as needed (ALLERGIES). antihistamine    . pantoprazole (PROTONIX) 40 MG tablet Take 1 tablet (40 mg total) by mouth daily. (Patient taking differently: Take 40 mg by mouth 4 (four) times a week. ) 90 tablet 2  . spironolactone (ALDACTONE) 25 MG tablet Take 1 tablet (25 mg total) by mouth daily. 90 tablet 2  . temazepam (RESTORIL) 15 MG capsule Take 1 capsule (15 mg total) by mouth at bedtime as needed for sleep. 45 capsule 0  . traZODone (DESYREL) 50 MG tablet Take 50 mg by mouth at bedtime.   2   No facility-administered medications prior to visit.      Allergies:   Patient has no known allergies.   Social History   Social History  . Marital status: Divorced    Spouse name: N/A  . Number of children: N/A  . Years of education: N/A   Social History Main Topics  . Smoking status: Former Smoker    Packs/day: 1.00    Years: 50.00    Types: Cigarettes    Quit date: 06/10/2011  . Smokeless tobacco: Never Used  . Alcohol use Yes  . Drug use: No  . Sexual activity: Not Asked   Other Topics Concern  . None   Social History Narrative   Entered 12/2013:   Lives with his Sister, Niece, and Mother   They take turns caring for mother, who is 74   Quit smoking in 2013     Family History:  The patient's family history includes Arrhythmia in his sister; Arthritis in his father and sister; Arthritis/Rheumatoid in his sister; CAD in his brother; Cancer in his sister; Hypertension in his sister; Osteoarthritis in his mother; Peripheral vascular disease in his  sister; Scoliosis in his sister.   ROS:   Please see the history of present illness.    ROS All other systems reviewed and are negative.   PHYSICAL EXAM:   VS:  BP 124/78   Pulse 65   Ht 5\' 10"  (1.778 m)   Wt 167 lb 9.6 oz (76 kg)   BMI 24.05 kg/m    GEN: Well nourished, well developed, in no acute distress  HEENT: normal  Neck: no JVD, carotid bruits, or masses Cardiac: RRR; no murmurs, rubs, or gallops,no edema  Respiratory:  clear to auscultation bilaterally, normal work of breathing + mild intermittent crackling in bilateral  base GI: soft, nontender, nondistended, + BS MS: no deformity or atrophy  Skin: warm and dry, no rash Neuro:  Alert and Oriented x 3, Strength and sensation are intact Psych: euthymic mood, full affect  Wt Readings from Last 3 Encounters:  01/20/17 167 lb 9.6 oz (76 kg)  12/09/16 162 lb (73.5 kg)  11/11/16 161 lb (73 kg)      Studies/Labs Reviewed:   EKG:  EKG is ordered today.  The ekg ordered today demonstrates Normal sinus rhythm, T-wave inversion in anterior leads.   Recent Labs: 10/27/2016: Magnesium 1.8 11/06/2016: BUN 10; Creat 0.77; Hemoglobin 11.9; Platelets 272; Potassium 4.8; Sodium 136; TSH 4.55 01/20/2017: ALT 48   Lipid Panel    Component Value Date/Time   CHOL 88 (L) 01/20/2017 1014   TRIG 55 01/20/2017 1014   HDL 51 01/20/2017 1014   CHOLHDL 1.7 01/20/2017 1014   CHOLHDL 2.2 10/29/2016 0332   VLDL 13 10/29/2016 0332   LDLCALC 26 01/20/2017 1014    Additional studies/ records that were reviewed today include:   Cath 10/24/2016 Conclusion     Prox RCA to Mid RCA lesion, 100 %stenosed.  Ost LAD to Prox LAD lesion, 95 %stenosed.  Mid LAD lesion, 100 %stenosed.  Ost 2nd Diag lesion, 95 %stenosed.  Ost 1st Mrg to 1st Mrg lesion, 99 %stenosed.  2nd Mrg lesion, 99 %stenosed.  Ost Cx to Prox Cx lesion, 60 %stenosed.  Mid Cx to Dist Cx lesion, 99 %stenosed.  The left ventricular ejection fraction is 35-45% by  visual estimate.  There is moderate to severe left ventricular systolic dysfunction.  LV end diastolic pressure is low.   IMPRESSION:Unsuccessful attempt at proximal mid LAD intervention in the setting of a large anterolateral infarct. The patient's EF is in the 30-35% with anteroapical wall motion abnormality. This proximal LAD was high-grade and calcified and the entire LAD was fluoroscopically calcified. His mid LAD behaved as "a CTO" and I was unable to cross. I abandoned intervention elected to place an intra-aortic balloon pump for hemodynamic support given his borderline blood pressure. His LVEDP was low. He received intravenous fluid bolus. His potassium was low as well and he received potassium IV. He was electrocardiographically stable throughout the procedure. He is not a candidate for coronary artery bypass grafting since I do not think there is an LAD target. I recommended medical therapy at this time.   Echo 10/25/2016 LV EF: 35% - 40%  ------------------------------------------------------------------- Indications: CAD of native vessels 414.01.  Study Conclusions  - Left ventricle: The cavity size was normal. There was mild concentric hypertrophy. Systolic function was moderately reduced. The estimated ejection fraction was in the range of 35% to 40%. Akinesis of the mid-apical anterolateral, mid-apical anteroseptal, inferoseptal, and apical myocardium. Hypokinesis of the inferior, anterior, and mid anterolateral wall. Doppler parameters are consistent with abnormal left ventricular relaxation (grade 1 diastolic dysfunction). Doppler parameters are consistent with high ventricular filling pressure. - Mitral valve: Transvalvular velocity was within the normal range. There was no evidence for stenosis. There was no regurgitation. - Left atrium: The atrium was mildly dilated. - Right ventricle: The cavity size was normal. Wall thickness  was normal. Systolic function was normal. - Atrial septum: No defect or patent foramen ovale was identified by color flow Doppler. - Tricuspid valve: There was mild regurgitation. - Pulmonary arteries: Systolic pressure was mildly increased. PA peak pressure: 41 mm Hg (S). - Global longitudinal strain -10.7% (abnormal).     ASSESSMENT:  1. Atypical chest pain   2. Coronary artery disease involving native coronary artery of native heart without angina pectoris   3. Essential hypertension   4. Hypothyroidism, unspecified type   5. Tobacco abuse      PLAN:  In order of problems listed above:  1. CAD: Known chronically occluded mid LAD based on cardiac catheterization in August 2018, recently has been having some atypical chest pain occurring at rest. This is also accompanied by some degree of shortness of breath as well. I have added Imdur 30 mg to his medical regimen. As for his shortness breath, he does have intermittent crackles in the bases along. I have started him on as needed dose of Lasix. He will take Lasix for the next 2 days before transition to as needed only. Continue on aspirin and Plavix. Cholesterol very well controlled.  2. Hypertension: Blood pressure stable  3. Hypothyroidism: We'll defer management to primary care provider.    Medication Adjustments/Labs and Tests Ordered: Current medicines are reviewed at length with the patient today.  Concerns regarding medicines are outlined above.  Medication changes, Labs and Tests ordered today are listed in the Patient Instructions below. Patient Instructions  Heart Failure Prevention Plan: 1. Avoid salt 2. Limit daily fluid intake to less than 2 liters 3. Weigh yourself every morning and call cardiology if weight increase by more than 3 lbs overnight or 5 lbs in a single week.  Azalee CourseHao Findley Vi, PA-C has recommended making the following medication changes: 1. START Furosemide 20 mg - take 1 tablet daily on an AS  NEEDED BASIS  >>TAKE 1 tablet TODAY AND TOMORROW  Please keep your follow-up appointment in November with Dr Allyson SabalBerry.    Ramond DialSigned, Amadea Keagy, [place]  01/22/2017 5:10 AM    [facility] [address], [place], [place]  [address] Phone: [phone]; Fax: [phone]

## 2017-01-20 NOTE — Patient Instructions (Addendum)
Heart Failure Prevention Plan: 1. Avoid salt 2. Limit daily fluid intake to less than 2 liters 3. Weigh yourself every morning and call cardiology if weight increase by more than 3 lbs overnight or 5 lbs in a single week.  Azalee Course, PA-C has recommended making the following medication changes: 1. START Furosemide 20 mg - take 1 tablet daily on an AS NEEDED BASIS  >>TAKE 1 tablet TODAY AND TOMORROW  Please keep your follow-up appointment in November with Dr Allyson Sabal.

## 2017-01-21 ENCOUNTER — Ambulatory Visit: Payer: Self-pay | Admitting: Physician Assistant

## 2017-01-21 NOTE — Progress Notes (Signed)
Cholesterol very well controlled. Liver enzyme mildly abnormal, but not significant enough to warrant discontinuation of lipitor. Repeat FLP and LFT in 4-6 month.

## 2017-01-22 ENCOUNTER — Encounter: Payer: Self-pay | Admitting: Physician Assistant

## 2017-02-03 ENCOUNTER — Ambulatory Visit (HOSPITAL_COMMUNITY): Payer: BC Managed Care – PPO | Attending: Cardiology

## 2017-02-03 ENCOUNTER — Other Ambulatory Visit: Payer: Self-pay

## 2017-02-03 DIAGNOSIS — I5030 Unspecified diastolic (congestive) heart failure: Secondary | ICD-10-CM | POA: Diagnosis not present

## 2017-02-03 DIAGNOSIS — I420 Dilated cardiomyopathy: Secondary | ICD-10-CM | POA: Insufficient documentation

## 2017-02-03 DIAGNOSIS — I5020 Unspecified systolic (congestive) heart failure: Secondary | ICD-10-CM | POA: Diagnosis present

## 2017-02-03 NOTE — Progress Notes (Signed)
Pumping function has not changed compare to previous echo in August, still mild to moderately low. Aortic root mildly dilated, but does not need to be fixed. Dr. Allyson Sabal to review during next office visit.

## 2017-02-05 ENCOUNTER — Telehealth: Payer: Self-pay | Admitting: *Deleted

## 2017-02-05 DIAGNOSIS — G479 Sleep disorder, unspecified: Secondary | ICD-10-CM

## 2017-02-05 MED ORDER — TEMAZEPAM 15 MG PO CAPS: 15.0000 mg | ORAL_CAPSULE | Freq: Every evening | ORAL | 1 refills | Status: DC | PRN

## 2017-02-05 NOTE — Telephone Encounter (Signed)
Received fax requesting refill on Restoril.   Ok to refill??  Last office visit 11/06/2016.  Last refill 10/01/2016.

## 2017-02-05 NOTE — Telephone Encounter (Signed)
Okay to refill? 

## 2017-02-05 NOTE — Telephone Encounter (Signed)
Prescription faxed

## 2017-02-09 ENCOUNTER — Ambulatory Visit (INDEPENDENT_AMBULATORY_CARE_PROVIDER_SITE_OTHER): Payer: BC Managed Care – PPO | Admitting: Family Medicine

## 2017-02-09 ENCOUNTER — Encounter: Payer: Self-pay | Admitting: Family Medicine

## 2017-02-09 ENCOUNTER — Other Ambulatory Visit: Payer: Self-pay

## 2017-02-09 VITALS — BP 118/60 | HR 80 | Temp 97.8°F | Resp 16 | Ht 70.0 in | Wt 166.0 lb

## 2017-02-09 DIAGNOSIS — B1920 Unspecified viral hepatitis C without hepatic coma: Secondary | ICD-10-CM | POA: Diagnosis not present

## 2017-02-09 DIAGNOSIS — E039 Hypothyroidism, unspecified: Secondary | ICD-10-CM | POA: Diagnosis not present

## 2017-02-09 DIAGNOSIS — I10 Essential (primary) hypertension: Secondary | ICD-10-CM

## 2017-02-09 NOTE — Assessment & Plan Note (Signed)
Blood pressure controlled no change in medication. 

## 2017-02-09 NOTE — Patient Instructions (Signed)
F/U 4 months PHYSICAL  Continue current medications

## 2017-02-09 NOTE — Progress Notes (Signed)
   Subjective:    Patient ID: Tommy Robbins, male    DOB: 1945/09/13, 71 y.o.   MRN: [id number]  Patient presents for 3 month F/U (is not fasting)   Pt here to f/u chronic medical problems   CHF- has appt with cardiology tomorrow, was given lasix on Oct 31 He does continue to have fatigue with his activities but it has improved some since his hospitalization.   Chronic pain sndrome on narcotics Dr. Vear Clock , he also prescribes the trazodone for bedtime    Chronic insomnia- takes restoril every other night, helps some    Hypothyroidism- taking daily in the morning   GERD- takes PPI every other day   Elevated liver functions noted.  He does have history of hepatitis C after further discussion he never completed treatment for the hepatitis C.  No new problems    Review Of Systems:  GEN- +fatigue, denie  fever, weight loss,weakness, recent illness HEENT- denies eye drainage, change in vision, nasal discharge, CVS- denies chest pain, palpitations RESP- denies SOB, cough, wheeze ABD- denies N/V, change in stools, abd pain GU- denies dysuria, hematuria, dribbling, incontinence MSK- denies joint pain, muscle aches, injury Neuro- denies headache, dizziness, syncope, seizure activity       Objective:    BP 118/60   Pulse 80   Temp 97.8 F (36.6 C) (Oral)   Resp 16   Ht 5\' 10"  (1.778 m)   Wt 166 lb (75.3 kg)   SpO2 97%   BMI 23.82 kg/m  GEN- NAD, alert and oriented x3 HEENT- PERRL, EOMI, non injected sclera, pink conjunctiva, MMM, oropharynx clear Neck- Supple, no thyromegaly CVS- RRR, no murmur RESP-CTAB ABD-NABS,soft,NT,ND EXT- No edema Pulses- Radial, DP- 2+        Assessment & Plan:      Problem List Items Addressed This Visit      Unprioritized   Hypothyroidism - Primary    Recheck thyroid function studies.      Relevant Orders   Basic metabolic panel   TSH   T3, free   T4, free   HYPERTENSION, BENIGN ESSENTIAL    Blood pressure controlled no  change in medication.      Hepatitis C    His hepatitis C was partially treated.  We will check hep C quant if he does have a significant amount of active hepatitis C he is willing to have repeat treatment.  This may be the cause of his elevated liver function test.      Relevant Orders   Hepatitis C RNA quantitative      Note: This dictation was prepared with Dragon dictation along with smaller phrase technology. Any transcriptional errors that result from this process are unintentional.

## 2017-02-09 NOTE — Assessment & Plan Note (Signed)
His hepatitis C was partially treated.  We will check hep C quant if he does have a significant amount of active hepatitis C he is willing to have repeat treatment.  This may be the cause of his elevated liver function test.

## 2017-02-09 NOTE — Assessment & Plan Note (Signed)
Recheck thyroid function studies. 

## 2017-02-10 ENCOUNTER — Ambulatory Visit (INDEPENDENT_AMBULATORY_CARE_PROVIDER_SITE_OTHER): Payer: BC Managed Care – PPO | Admitting: Cardiovascular Disease

## 2017-02-10 ENCOUNTER — Encounter: Payer: Self-pay | Admitting: Cardiovascular Disease

## 2017-02-10 DIAGNOSIS — I10 Essential (primary) hypertension: Secondary | ICD-10-CM | POA: Diagnosis not present

## 2017-02-10 DIAGNOSIS — I2102 ST elevation (STEMI) myocardial infarction involving left anterior descending coronary artery: Secondary | ICD-10-CM | POA: Diagnosis not present

## 2017-02-10 NOTE — Patient Instructions (Signed)
REFERRAL TO EP TO DISCUSS ICD FOR SUDDEN CARDIAC DEATH  Your physician recommends that you schedule a follow-up appointment in: 3 MONTHS WITH HAO MENG PA  Your physician wants you to follow-up in: 6 MONTHS WITH DR Allyson Sabal You will receive a reminder letter in the mail two months in advance. If you don't receive a letter, please call our office to schedule the follow-up appointment.    If you need a refill on your cardiac medications before your next appointment, please call your pharmacy.

## 2017-02-10 NOTE — Progress Notes (Signed)
02/10/2017 Duke Salvia Cardella   1946-03-02  [id number]  Primary Physician Jeanice Lim Velna Hatchet, MD Primary Cardiologist: Runell Gess MD Milagros Loll, [place], [place]  HPI:  Tommy Robbins is a 71 y.o. male who had witnessed sudden cardiac death on 11-16-2016. He went bystander CPR by his niece. His return of spontaneous circulation was 17 minutes. His initial rhythm was ventricular fibrillation. He was brought urgently to the cath lab where I performed cardiac catheterization revealing three-vessel disease. I attempted unsuccessfully to cross a LAD CTO. It was also decided to treat him medically. I did place an intra-aortic balloon pump because of hemodynamic instability. Ultimately, he regained consciousness, was extubated and discharged home. He has since stopped smoking. 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   Current Meds  Medication Sig  . aspirin EC 81 MG tablet Take 81 mg by mouth daily.  Marland Kitchen atorvastatin (LIPITOR) 80 MG tablet Take 1 tablet (80 mg total) by mouth daily at 6 PM.  . clopidogrel (PLAVIX) 75 MG tablet Take 1 tablet (75 mg total) by mouth daily.  . furosemide (LASIX) 20 MG tablet Take 1 tablet (20 mg total) by mouth daily as needed.  . isosorbide mononitrate (IMDUR) 30 MG 24 hr tablet Take 1 tablet (30 mg total) by mouth daily.  Marland Kitchen levothyroxine (SYNTHROID, LEVOTHROID) 88 MCG tablet TAKE 1 TABLET DAILY BEFORE BREAKFAST  . lidocaine (LIDODERM) 5 % USE 3 PATCHES ON THE SKIN DAILY  . lisinopril (PRINIVIL,ZESTRIL) 20 MG tablet Take 1 tablet (20 mg total) by mouth daily.  . metoprolol succinate (TOPROL-XL) 50 MG 24 hr tablet Take 1 tablet (50 mg total) by mouth daily. Take with or immediately following a meal.  . morphine (MS CONTIN) 15 MG 12 hr tablet Take 30-45 mg by mouth See admin instructions. Takes 45 mg in the morning, 30 mg in the afternoon and 30 mg at night  . morphine (MS CONTIN) 30 MG 12 hr tablet Take 30-45 mg by mouth See admin instructions. Takes  45 mg in the morning, 30 mg in the afternoon, and 30 mg at night  . nitroGLYCERIN (NITROSTAT) 0.4 MG SL tablet Place 1 tablet (0.4 mg total) under the tongue every 5 (five) minutes as needed for chest pain.  . Nutritional Supplements (JUICE PLUS FIBRE PO) Take 2 capsules by mouth 2 (two) times daily.  Marland Kitchen OVER THE COUNTER MEDICATION Take 1-2 tablets by mouth as needed (ALLERGIES). antihistamine  . pantoprazole (PROTONIX) 40 MG tablet Take 1 tablet (40 mg total) by mouth daily. (Patient taking differently: Take 40 mg by mouth 4 (four) times a week. )  . spironolactone (ALDACTONE) 25 MG tablet Take 1 tablet (25 mg total) by mouth daily.  . temazepam (RESTORIL) 15 MG capsule Take 1 capsule (15 mg total) at bedtime as needed by mouth for sleep.  . traZODone (DESYREL) 50 MG tablet Take 50 mg by mouth at bedtime.      No Known Allergies  Social History   Socioeconomic History  . Marital status: Divorced    Spouse name: Not on file  . Number of children: Not on file  . Years of education: Not on file  . Highest education level: Not on file  Social Needs  . Financial resource strain: Not on file  . Food insecurity - worry: Not on file  . Food insecurity - inability: Not on file  . Transportation needs - medical: Not on file  .  Transportation needs - non-medical: Not on file  Occupational History  . Not on file  Tobacco Use  . Smoking status: Former Smoker    Packs/day: 1.00    Years: 50.00    Pack years: 50.00    Types: Cigarettes    Last attempt to quit: 06/10/2011    Years since quitting: 5.6  . Smokeless tobacco: Never Used  Substance and Sexual Activity  . Alcohol use: Yes  . Drug use: No  . Sexual activity: Not on file  Other Topics Concern  . Not on file  Social History Narrative   Entered 12/2013:   Lives with his Sister, Niece, and Mother   They take turns caring for mother, who is [id number]   Quit smoking in 2013     Review of Systems: General: negative for chills, fever,  night sweats or weight changes.  Cardiovascular: negative for chest pain, dyspnea on exertion, edema, orthopnea, palpitations, paroxysmal nocturnal dyspnea or shortness of breath Dermatological: negative for rash Respiratory: negative for cough or wheezing Urologic: negative for hematuria Abdominal: negative for nausea, vomiting, diarrhea, bright red blood per rectum, melena, or hematemesis Neurologic: negative for visual changes, syncope, or dizziness All other systems reviewed and are otherwise negative except as noted above.    Blood pressure 121/74, pulse 77, height 5\' 10"  (1.778 m), weight 165 lb 9.6 oz (75.1 kg), SpO2 97 %.  General appearance: alert and no distress Neck: no adenopathy, no carotid bruit, no JVD, supple, symmetrical, trachea midline and thyroid not enlarged, symmetric, no tenderness/mass/nodules Lungs: clear to auscultation bilaterally Heart: regular rate and rhythm, S1, S2 normal, no murmur, click, rub or gallop Extremities: extremities normal, atraumatic, no cyanosis or edema Pulses: 2+ and symmetric Skin: Skin color, texture, turgor normal. No rashes or lesions Neurologic: Alert and oriented X 3, normal strength and tone. Normal symmetric reflexes. Normal coordination and gait  EKG not performed today  ASSESSMENT AND PLAN:   HYPERTENSION, BENIGN ESSENTIAL History of essential hypertension blood pressure medicine today 121/74. He is on lisinopril and metoprolol. Continue current meds at current dosing  Acute ST elevation myocardial infarction (STEMI) involving left anterior descending (LAD) coronary artery (HCC) History of VF, and cardiac death, CPR and defibrillation and urgent cath revealing three-vessel disease with severe LV dysfunction and insertion of an intra-aortic balloon pump. He was ultimately extubated, weaned from the balloon pump and medical therapy was recommended. Discharged home and saw Azalee CourseHao Meng PA-C several weeks ago who started a extended  release oral nitrate. He has stopped smoking since. He denies chest pain or shortness of breath. His EF by recent 2-D echo was 35-40%. I think he would be a good candidate for ICD implantation for secondary prevention.      Runell GessJonathan J. Cahterine Heinzel MD FACP,FACC,FAHA, [facility] 02/10/2017 12:18 PM

## 2017-02-10 NOTE — Assessment & Plan Note (Signed)
History of essential hypertension blood pressure medicine today 121/74. He is on lisinopril and metoprolol. Continue current meds at current dosing

## 2017-02-10 NOTE — Assessment & Plan Note (Signed)
History of VF, and cardiac death, CPR and defibrillation and urgent cath revealing three-vessel disease with severe LV dysfunction and insertion of an intra-aortic balloon pump. He was ultimately extubated, weaned from the balloon pump and medical therapy was recommended. Discharged home and saw Azalee Course PA-C several weeks ago who started a extended release oral nitrate. He has stopped smoking since. He denies chest pain or shortness of breath. His EF by recent 2-D echo was 35-40%. I think he would be a good candidate for ICD implantation for secondary prevention.

## 2017-02-12 LAB — BASIC METABOLIC PANEL
BUN: 18 mg/dL (ref 7–25)
CALCIUM: 8.8 mg/dL (ref 8.6–10.3)
CHLORIDE: 104 mmol/L (ref 98–110)
CO2: 25 mmol/L (ref 20–32)
Creat: 0.74 mg/dL (ref 0.70–1.18)
Glucose, Bld: 87 mg/dL (ref 65–99)
Potassium: 4.8 mmol/L (ref 3.5–5.3)
Sodium: 137 mmol/L (ref 135–146)

## 2017-02-12 LAB — T4, FREE: FREE T4: 1.2 ng/dL (ref 0.8–1.8)

## 2017-02-12 LAB — HEPATITIS C RNA QUANTITATIVE
HCV Quantitative Log: 5.88 Log IU/mL — ABNORMAL HIGH
HCV RNA, PCR, QN: 756000 IU/mL — ABNORMAL HIGH

## 2017-02-12 LAB — T3, FREE: T3 FREE: 2.9 pg/mL (ref 2.3–4.2)

## 2017-02-12 LAB — TSH: TSH: 0.7 m[IU]/L (ref 0.40–4.50)

## 2017-02-15 ENCOUNTER — Ambulatory Visit (INDEPENDENT_AMBULATORY_CARE_PROVIDER_SITE_OTHER): Payer: BC Managed Care – PPO | Admitting: Internal Medicine

## 2017-02-15 ENCOUNTER — Other Ambulatory Visit: Payer: Self-pay | Admitting: *Deleted

## 2017-02-15 ENCOUNTER — Encounter: Payer: Self-pay | Admitting: Internal Medicine

## 2017-02-15 VITALS — BP 106/70 | HR 60 | Ht 70.0 in | Wt 168.8 lb

## 2017-02-15 DIAGNOSIS — I5022 Chronic systolic (congestive) heart failure: Secondary | ICD-10-CM | POA: Diagnosis not present

## 2017-02-15 DIAGNOSIS — I4901 Ventricular fibrillation: Secondary | ICD-10-CM

## 2017-02-15 DIAGNOSIS — I255 Ischemic cardiomyopathy: Secondary | ICD-10-CM

## 2017-02-15 DIAGNOSIS — K732 Chronic active hepatitis, not elsewhere classified: Secondary | ICD-10-CM

## 2017-02-15 DIAGNOSIS — I2510 Atherosclerotic heart disease of native coronary artery without angina pectoris: Secondary | ICD-10-CM | POA: Diagnosis not present

## 2017-02-15 NOTE — Patient Instructions (Addendum)
Medication Instructions:  Your physician recommends that you continue on your current medications as directed. Please refer to the Current Medication list given to you today.   Labwork: Your physician recommends that you return for lab work today: BMP/CBC   Testing/Procedures: Your physician has recommended that you have a defibrillator inserted. An implantable cardioverter defibrillator (ICD) is a small device that is placed in your chest or, in rare cases, your abdomen. This device uses electrical pulses or shocks to help control life-threatening, irregular heartbeats that could lead the heart to suddenly stop beating (sudden cardiac arrest). Leads are attached to the ICD that goes into your heart. This is done in the hospital and usually requires an overnight stay. Please see the instruction sheet given to you today for more information---02/23/17  Please arrive at [facility] at 1:30pm Okay to have a light breakfast Nothing to eat or drink after 7:30am Do not take any medications the morning of the test HOLD Plavix 3 days prior to procedure--last dose on 02/19/17 Use scrub according to direction Plan for one night stay Will need someone to drive you home at discharge    Follow-Up: Your physician recommends that you schedule a follow-up appointment in: 10-14 days from 02/23/17 for wound check in device clinicand 3 months from 02/23/17 with Dr Johney Frame   Any Other Special Instructions Will Be Listed Below (If Applicable).      Cardioverter Defibrillator Implantation An implantable cardioverter defibrillator (ICD) is a small device that is placed under the skin in the chest or abdomen. An ICD consists of a battery, a small computer (pulse generator), and wires (leads) that go into the heart. An ICD is used to detect and correct two types of dangerous irregular heartbeats (arrhythmias):  A rapid heart rhythm (tachycardia).  An arrhythmia in  which the lower chambers of the heart (ventricles) contract in an uncoordinated way (fibrillation).  When an ICD detects tachycardia, it sends a low-energy shock to the heart to restore the heartbeat to normal (cardioversion). This signal is usually painless. If cardioversion does not work or if the ICD detects fibrillation, it delivers a high-energy shock to the heart (defibrillation) to restart the heart. This shock may feel like a strong jolt in the chest. Your health care provider may prescribe an ICD if:  You have had an arrhythmia that originated in the ventricles.  Your heart has been damaged by a disease or heart condition.  Sometimes, ICDs are programmed to act as a device called a pacemaker. Pacemakers can be used to treat a slow heartbeat (bradycardia) or tachycardia by taking over the heart rate with electrical impulses. Tell a health care provider about:  Any allergies you have.  All medicines you are taking, including vitamins, herbs, eye drops, creams, and over-the-counter medicines.  Any problems you or family members have had with anesthetic medicines.  Any blood disorders you have.  Any surgeries you have had.  Any medical conditions you have.  Whether you are pregnant or may be pregnant. What are the risks? Generally, this is a safe procedure. However, problems may occur, including:  Swelling, bleeding, or bruising.  Infection.  Blood clots.  Damage to other structures or organs, such as nerves, blood vessels, or the heart.  Allergic reactions to medicines used during the procedure.  What happens before the procedure? Staying hydrated Follow instructions from your health care provider about hydration, which may include:  Up to 2 hours before the procedure -  you may continue to drink clear liquids, such as water, clear fruit juice, black coffee, and plain tea.  Eating and drinking restrictions Follow instructions from your health care provider about  eating and drinking, which may include:  8 hours before the procedure - stop eating heavy meals or foods such as meat, fried foods, or fatty foods.  6 hours before the procedure - stop eating light meals or foods, such as toast or cereal.  6 hours before the procedure - stop drinking milk or drinks that contain milk.  2 hours before the procedure - stop drinking clear liquids.  Medicine Ask your health care provider about:  Changing or stopping your normal medicines. This is important if you take diabetes medicines or blood thinners.  Taking medicines such as aspirin and ibuprofen. These medicines can thin your blood. Do not take these medicines before your procedure if your doctor tells you not to.  Tests  You may have blood tests.  You may have a test to check the electrical signals in your heart (electrocardiogram, ECG).  You may have imaging tests, such as a chest X-ray. General instructions  For 24 hours before the procedure, stop using products that contain nicotine or tobacco, such as cigarettes and e-cigarettes. If you need help quitting, ask your health care provider.  Plan to have someone take you home from the hospital or clinic.  You may be asked to shower with a germ-killing soap. What happens during the procedure?  To reduce your risk of infection: ? Your health care team will wash or sanitize their hands. ? Your skin will be washed with soap. ? Hair may be removed from the surgical area.  Small monitors will be put on your body. They will be used to check your heart, blood pressure, and oxygen level.  An IV tube will be inserted into one of your veins.  You will be given one or more of the following: ? A medicine to help you relax (sedative). ? A medicine to numb the area (local anesthetic). ? A medicine to make you fall asleep (general anesthetic).  Leads will be guided through a blood vessel into your heart and attached to your heart muscles. Depending  on the ICD, the leads may go into one ventricle or they may go into both ventricles and into an upper chamber of the heart. An X-ray machine (fluoroscope) will be usedto help guide the leads.  A small incision will be made to create a deep pocket under your skin.  The pulse generator will be placed into the pocket.  The ICD will be tested.  The incision will be closed with stitches (sutures), skin glue, or staples.  A bandage (dressing) will be placed over the incision. This procedure may vary among health care providers and hospitals. What happens after the procedure?  Your blood pressure, heart rate, breathing rate, and blood oxygen level will be monitored often until the medicines you were given have worn off.  A chest X-ray will be taken to check that the ICD is in the right place.  You will need to stay in the hospital for 1-2 days so your health care provider can make sure your ICD is working.  Do not drive for 24 hours if you received a sedative. Ask your health care provider when it is safe for you to drive.  You may be given an identification card explaining that you have an ICD. Summary  An implantable cardioverter defibrillator (ICD) is a  small device that is placed under the skin in the chest or abdomen. It is used to detect and correct dangerous irregular heartbeats (arrhythmias).  An ICD consists of a battery, a small computer (pulse generator), and wires (leads) that go into the heart.  When an ICD detects rapid heart rhythm (tachycardia), it sends a low-energy shock to the heart to restore the heartbeat to normal (cardioversion). If cardioversion does not work or if the ICD detects uncoordinated heart contractions (fibrillation), it delivers a high-energy shock to the heart (defibrillation) to restart the heart.  You will need to stay in the hospital for 1-2 days to make sure your ICD is working. This information is not intended to replace advice given to you by your  health care provider. Make sure you discuss any questions you have with your health care provider. Document Released: 11/29/2001 Document Revised: 03/18/2016 Document Reviewed: 03/18/2016 Elsevier Interactive Patient Education  2017 [employer]

## 2017-02-15 NOTE — Progress Notes (Signed)
Electrophysiology Office Note   Date:  02/15/2017   ID:  Tommy Robbins, DOB 22-Mar-1946, MRN [id number]  PCP:  Dorena Bodo, PA-C  Cardiologist:  Dr Allyson Sabal Primary Electrophysiologist: Hillis Range, MD    CC: CHF   History of Present Illness: Tommy Robbins is a 71 y.o. male who presents today for electrophysiology evaluation.   The patient is referred by Dr Allyson Sabal for EP consultation regarding risks of sudden death.  He is s/p VF arrest October 30, 2016.  He required defibrillation and has made good recovery.  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  Medical management was advised.  He has a h/o remote IVDA and hep C.  He no longer uses drugs.  EF is 35%.  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  He did have a robust elevation in [place] though in the setting of CPR. He has made slow recovery.  Not very active. Today, he denies symptoms of palpitations, chest pain, shortness of breath, orthopnea, PND, lower extremity edema, claudication, dizziness, presyncope, further syncope, bleeding, or neurologic sequela. The patient is tolerating medications without difficulties and is otherwise without complaint today.    Past Medical History:  Diagnosis Date  . Cataract   . Cellulitis and abscess of right leg 10/06/2016   right thigh  . Chronic low back pain   . Colon polyps   . Depression   . Diverticulosis   . GERD (gastroesophageal reflux disease)   . Hep C w/ coma, chronic (HCC)   . Hypertension   . Insomnia   . Thyroid disease    hypothyroid   Past Surgical History:  Procedure Laterality Date  . IABP INSERTION N/A 10/24/2016   Procedure: IABP Insertion;  Surgeon: Runell Gess, MD;  Location: [facility] INVASIVE CV LAB;  Service: Cardiovascular;  Laterality: N/A;  . JOINT  REPLACEMENT Right 03/23/2008   Shoulder- Replacement  . LEFT HEART CATH AND CORONARY ANGIOGRAPHY N/A 10/24/2016   Procedure: LEFT HEART CATH AND CORONARY ANGIOGRAPHY;  Surgeon: Runell Gess, MD;  Location: [facility] INVASIVE CV LAB;  Service: Cardiovascular;  Laterality: N/A;     Current Outpatient Medications  Medication Sig Dispense Refill  . aspirin EC 81 MG tablet Take 81 mg by mouth daily.    Marland Kitchen atorvastatin (LIPITOR) 80 MG tablet Take 1 tablet (80 mg total) by mouth daily at 6 PM. 90 tablet 2  . clopidogrel (PLAVIX) 75 MG tablet Take 1 tablet (75 mg total) by mouth daily. 90 tablet 2  . furosemide (LASIX) 20 MG tablet Take 1 tablet (20 mg total) by mouth daily as needed. 90 tablet 3  . isosorbide mononitrate (IMDUR) 30 MG 24 hr tablet Take 1 tablet (30 mg total) by mouth daily. 90 tablet 3  . levothyroxine (SYNTHROID, LEVOTHROID) 88 MCG tablet TAKE 1 TABLET DAILY BEFORE BREAKFAST 90 tablet 2  . lidocaine (LIDODERM) 5 % USE 3 PATCHES ON THE SKIN DAILY  4  . lisinopril (PRINIVIL,ZESTRIL) 20 MG tablet Take 1 tablet (20 mg total) by mouth daily. 90 tablet 2  . metoprolol succinate (TOPROL-XL) 50 MG 24 hr tablet Take 1 tablet (50 mg total) by mouth daily. Take with or immediately following a meal. 90 tablet 3  . morphine (MS CONTIN) 15 MG 12 hr tablet Take 30-45 mg by mouth  See admin instructions. Takes 45 mg in the morning, 30 mg in the afternoon and 30 mg at night  0  . morphine (MS CONTIN) 30 MG 12 hr tablet Take 30-45 mg by mouth See admin instructions. Takes 45 mg in the morning, 30 mg in the afternoon, and 30 mg at night    . nitroGLYCERIN (NITROSTAT) 0.4 MG SL tablet Place 1 tablet (0.4 mg total) under the tongue every 5 (five) minutes as needed for chest pain. 25 tablet 3  . Nutritional Supplements (JUICE PLUS FIBRE PO) Take 2 capsules by mouth 2 (two) times daily.    Marland Kitchen OVER THE COUNTER MEDICATION Take 1-2 tablets by mouth as needed (ALLERGIES). antihistamine    . pantoprazole (PROTONIX) 40  MG tablet Take 1 tablet (40 mg total) by mouth daily. 90 tablet 2  . spironolactone (ALDACTONE) 25 MG tablet Take 1 tablet (25 mg total) by mouth daily. 90 tablet 2  . temazepam (RESTORIL) 15 MG capsule Take 1 capsule (15 mg total) at bedtime as needed by mouth for sleep. 45 capsule 1  . traZODone (DESYREL) 50 MG tablet Take 50 mg by mouth at bedtime.   2   No current facility-administered medications for this visit.     Allergies:   Other   Social History:  The patient  reports that he quit smoking about 5 years ago. His smoking use included cigarettes. He has a 50.00 pack-year smoking history. he has never used smokeless tobacco. He reports that he drinks alcohol. He reports that he does not use drugs.   Family History:  The patient's  family history includes Arrhythmia in his sister; Arthritis in his father and sister; Arthritis/Rheumatoid in his sister; CAD in his brother; Cancer in his sister; Hypertension in his sister; Osteoarthritis in his mother; Peripheral vascular disease in his sister; Scoliosis in his sister.  Sister has prolonged QT   ROS:  Please see the history of present illness.   All other systems are personally reviewed and negative.    PHYSICAL EXAM: VS:  BP 106/70   Pulse 60   Ht 5\' 10"  (1.778 m)   Wt 168 lb 12.8 oz (76.6 kg)   SpO2 96%   BMI 24.22 kg/m  , BMI Body mass index is 24.22 kg/m. GEN: Well nourished, well developed, in no acute distress  HEENT: normal  Neck: no JVD, carotid bruits, or masses Cardiac: RRR; no murmurs, rubs, or gallops,no edema  Respiratory:  clear to auscultation bilaterally, normal work of breathing GI: soft, nontender, nondistended, + BS MS: no deformity or atrophy  Skin: warm and dry  Neuro:  Strength and sensation are intact Psych: euthymic mood, full affect  EKG:  EKGs from 11/11/16 and 01/20/17 are reviewed and reveal sinus bradycardia with HR 49-61, anterior infraction, QRS 82 msec   Recent Labs: 10/27/2016: Magnesium  1.8 11/06/2016: Hemoglobin 11.9; Platelets 272 01/20/2017: ALT 48 02/09/2017: BUN 18; Creat 0.74; Potassium 4.8; Sodium 137; TSH 0.70  personally reviewed   Lipid Panel     Component Value Date/Time   CHOL 88 (L) 01/20/2017 1014   TRIG 55 01/20/2017 1014   HDL 51 01/20/2017 1014   CHOLHDL 1.7 01/20/2017 1014   CHOLHDL 2.2 10/29/2016 0332   VLDL 13 10/29/2016 0332   LDLCALC 26 01/20/2017 1014   personally reviewed   Wt Readings from Last 3 Encounters:  02/15/17 168 lb 12.8 oz (76.6 kg)  02/10/17 165 lb 9.6 oz (75.1 kg)  02/09/17 166 lb (75.3 kg)  Other studies personally reviewed: Additional studies/ records that were reviewed today include: Dr Hazle CocaBerry's records, recent echo, [facility], prior cath, Dr Illa LevelJordans hospital notes  Review of the above records today demonstrates: as above   ASSESSMENT AND PLAN:  1.  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  He is s/p recent successful resuscitation from VF arrest, requiring 4 external defibrillation shocks.  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  No reversible causes have been found    He is referred by Dr Allyson SabalBerry for risk stratification of sudden death and consideration of ICD implantation.  At this time, he meets criteria for ICD implantation for secondary prevention of sudden death.  I have had a thorough discussion with the patient reviewing options.  The patient and their family have had opportunities to ask questions and have them answered. The patient and I have decided together through a shared decision making process to ICD at this time.   Risks, benefits, alternatives to ICD implantation were discussed in detail with the patient today. The patient understands that the risks include but are not limited to bleeding, infection, pneumothorax, perforation, tamponade, vascular damage, renal failure, MI, stroke, death, inappropriate shocks, and lead  dislodgement and wishes to proceed.  We will therefore schedule device implantation at the next available time.  Given narrow QRS, he is not a candidate for CRT.  Given bradycardia, I would plan to place an atrial lead.  No driving x 6 months (pt aware)    Current medicines are reviewed at length with the patient today.   The patient does not have concerns regarding his medicines.  The following changes were made today:  none    Signed, Hillis RangeJames Gerson Fauth, MD  02/15/2017 3:35 PM     [facility] [address] [place] [address] [phone] (office) [phone] (fax)

## 2017-02-16 LAB — CBC WITH DIFFERENTIAL/PLATELET
BASOS ABS: 0 10*3/uL (ref 0.0–0.2)
BASOS: 1 %
EOS (ABSOLUTE): 0.2 10*3/uL (ref 0.0–0.4)
Eos: 5 %
Hematocrit: 38.7 % (ref 37.5–51.0)
Hemoglobin: 12.9 g/dL — ABNORMAL LOW (ref 13.0–17.7)
IMMATURE GRANS (ABS): 0 10*3/uL (ref 0.0–0.1)
IMMATURE GRANULOCYTES: 0 %
LYMPHS: 25 %
Lymphocytes Absolute: 0.9 10*3/uL (ref 0.7–3.1)
MCH: 29.9 pg (ref 26.6–33.0)
MCHC: 33.3 g/dL (ref 31.5–35.7)
MCV: 90 fL (ref 79–97)
MONOS ABS: 0.6 10*3/uL (ref 0.1–0.9)
Monocytes: 16 %
NEUTROS PCT: 53 %
Neutrophils Absolute: 2 10*3/uL (ref 1.4–7.0)
PLATELETS: 136 10*3/uL — AB (ref 150–379)
RBC: 4.32 x10E6/uL (ref 4.14–5.80)
RDW: 13.6 % (ref 12.3–15.4)
WBC: 3.7 10*3/uL (ref 3.4–10.8)

## 2017-02-16 LAB — BASIC METABOLIC PANEL
BUN/Creatinine Ratio: 20 (ref 10–24)
BUN: 17 mg/dL (ref 8–27)
CALCIUM: 9 mg/dL (ref 8.6–10.2)
CHLORIDE: 105 mmol/L (ref 96–106)
CO2: 21 mmol/L (ref 20–29)
Creatinine, Ser: 0.85 mg/dL (ref 0.76–1.27)
GFR calc Af Amer: 102 mL/min/{1.73_m2} (ref >59)
GFR calc non Af Amer: 88 mL/min/{1.73_m2} (ref >59)
GLUCOSE: 115 mg/dL — AB (ref 65–99)
POTASSIUM: 5.7 mmol/L — AB (ref 3.5–5.2)
SODIUM: 141 mmol/L (ref 134–144)

## 2017-02-17 ENCOUNTER — Telehealth: Payer: Self-pay

## 2017-02-17 NOTE — Telephone Encounter (Signed)
Spoke with DOD Dr. Excell Seltzer about the patient's elevated K.  He suggests holding the Spironolactone until post ICD implant on 12/4.  I then called the patient and informed him to stop taking the medication effective immediately.  Patient verbalized understanding.  Will forward to PCP and Dr. Berle Mull

## 2017-02-22 ENCOUNTER — Telehealth: Payer: Self-pay | Admitting: Internal Medicine

## 2017-02-22 NOTE — Telephone Encounter (Signed)
Spoke with Tresa Endo, RN/Dr. Johney Frame - HOLD plavix dose today. OK to take PRN pain med tomorrow. Sister/patient aware.  Patient has taken plavix today per sister.

## 2017-02-22 NOTE — Telephone Encounter (Signed)
Returned call to patient's sister. Advised best to take tylenol over ibuprofen.

## 2017-02-22 NOTE — Telephone Encounter (Signed)
Patient sister Graciella Belton) calling, states that patient is scheduled for cath tomorrow and patient is having some shoulder pain. Dianne would like to know if patient can have a ibuprofen?

## 2017-02-22 NOTE — Telephone Encounter (Signed)
Patient's sister called back in. Patient was to HOLD plavix for 3 days prior to ICD implant - last dose 11/30.Marland Kitchen Patient has NOT held medication. Also, would like to know if he can take MS contin day of procedure - advised that AVS says NO meds day of procedure. Sister would like MD opinion.   Routed to Dr. Leland Her, RN

## 2017-02-23 ENCOUNTER — Ambulatory Visit (HOSPITAL_COMMUNITY)
Admission: RE | Admit: 2017-02-23 | Discharge: 2017-02-23 | Disposition: A | Discharge status: Home / Self Care | Payer: BC Managed Care – PPO | Source: Ambulatory Visit | Attending: Internal Medicine | Admitting: Internal Medicine

## 2017-02-23 ENCOUNTER — Encounter (HOSPITAL_COMMUNITY)
Admission: RE | Disposition: A | Discharge status: Home / Self Care | Payer: Self-pay | Source: Ambulatory Visit | Attending: Internal Medicine

## 2017-02-23 DIAGNOSIS — S40022A Contusion of left upper arm, initial encounter: Secondary | ICD-10-CM | POA: Insufficient documentation

## 2017-02-23 DIAGNOSIS — Z5309 Procedure and treatment not carried out because of other contraindication: Secondary | ICD-10-CM | POA: Insufficient documentation

## 2017-02-23 DIAGNOSIS — X509XXA Other and unspecified overexertion or strenuous movements or postures, initial encounter: Secondary | ICD-10-CM | POA: Diagnosis not present

## 2017-02-23 LAB — SURGICAL PCR SCREEN
MRSA, PCR: NEGATIVE
Staphylococcus aureus: NEGATIVE

## 2017-02-23 LAB — POTASSIUM: POTASSIUM: 4.1 mmol/L (ref 3.5–5.1)

## 2017-02-23 SURGERY — ICD IMPLANT

## 2017-02-23 MED ORDER — SODIUM CHLORIDE 0.9 % IV SOLN
INTRAVENOUS | Status: DC
  Administered 2017-02-23: 14:00:00 via INTRAVENOUS

## 2017-02-23 MED ORDER — MUPIROCIN 2 % EX OINT
TOPICAL_OINTMENT | CUTANEOUS | Status: AC
  Administered 2017-02-23: 13:00:00
  Filled 2017-02-23: qty 22

## 2017-02-23 MED ORDER — CHLORHEXIDINE GLUCONATE 4 % EX LIQD
60.0000 mL | Freq: Once | CUTANEOUS | Status: DC
  Filled 2017-02-23: qty 60

## 2017-02-23 MED ORDER — SODIUM CHLORIDE 0.9 % IR SOLN: 80.0000 mg | Status: DC

## 2017-02-23 MED ORDER — CEFAZOLIN SODIUM-DEXTROSE 2-4 GM/100ML-% IV SOLN: 2.0000 g | INTRAVENOUS | Status: DC

## 2017-02-23 NOTE — H&P (Signed)
L warm is moderately swollen and warm.  He reports pulling up from a chair and feels that he may have injured his arm with this.   On exam, he has a moderate L bicipital hematoma.  This is warm and red. Denies fevers or chills.  I think that it is best to cancel if ICD implantation for today.  I will have him see me in the office next Wednesday (03/03/17).  If  Better at that time, we will reschedule, possibly for 03/05/17 am.  Hillis Range MD, [facility] 02/23/2017 2:39 PM

## 2017-02-23 NOTE — Progress Notes (Signed)
Called pt to let him know his PCR was negative and that he does not need to use his mupirin.Spoke with pt's wife

## 2017-02-23 NOTE — Progress Notes (Signed)
Upon starting pt's IV in left arm I noticed large bruising at bicep area.  Slightly warm to touch.  Pt denies any veno punctures to this area.  States he does not know how that happened.  Dr. Johney Frame notified

## 2017-02-25 ENCOUNTER — Encounter: Payer: Self-pay | Admitting: Family Medicine

## 2017-02-25 ENCOUNTER — Ambulatory Visit (INDEPENDENT_AMBULATORY_CARE_PROVIDER_SITE_OTHER): Payer: BC Managed Care – PPO | Admitting: Family Medicine

## 2017-02-25 VITALS — BP 118/70 | HR 58 | Temp 97.9°F | Resp 14 | Ht 70.0 in | Wt 173.0 lb

## 2017-02-25 DIAGNOSIS — S40022A Contusion of left upper arm, initial encounter: Secondary | ICD-10-CM | POA: Diagnosis not present

## 2017-02-25 MED ORDER — CEPHALEXIN 500 MG PO CAPS: 500.0000 mg | ORAL_CAPSULE | Freq: Three times a day (TID) | ORAL | 0 refills | Status: DC

## 2017-02-25 NOTE — Progress Notes (Signed)
Subjective:    Patient ID: Tommy Robbins, male    DOB: 09-21-1945, 71 y.o.   MRN: [id number]  HPI Patient is a very pleasant 71 year old white male with a history of coronary artery disease and is on dual antiplatelet therapy for this reason.  One week ago, he had to pick his mother up off the floor who had fallen cradling her in his arms.  This causes compressive trauma to the medial aspect of his left bicep.  The following morning, he noticed significant bruising in the medial left bicep.  The bruising extended Saturday Sunday into Monday.  The bruising range from his medial upper bicep near his axilla down to the midpoint of his left forearm.  Patient has currently held his Plavix in preparation for defibrillator placement next week.  After holding the Plavix, the bruising has started to fade.  It is turning yellow and brownish in color.  The swelling is going down and it is no longer tender or painful.  There is no evidence of a secondary cellulitis.  He has good arterial pulses when checked at the radial pulse and the ulnar pulse.  All of his fingers are neurovascularly intact.  There is no evidence of a DVT in the left arm.  This appears to be a subcutaneous hematoma secondary to trauma to superficial blood vessel Past Medical History:  Diagnosis Date  . Cataract   . Cellulitis and abscess of right leg 10/06/2016   right thigh  . Chronic low back pain   . Colon polyps   . Depression   . Diverticulosis   . GERD (gastroesophageal reflux disease)   . Hep C w/ coma, chronic (HCC)   . Hypertension   . Insomnia   . Thyroid disease    hypothyroid   Past Surgical History:  Procedure Laterality Date  . IABP INSERTION N/A 10/24/2016   Procedure: IABP Insertion;  Surgeon: Runell Gess, MD;  Location: [facility] INVASIVE CV LAB;  Service: Cardiovascular;  Laterality: N/A;  . JOINT REPLACEMENT Right 03/23/2008   Shoulder- Replacement  . LEFT HEART CATH AND CORONARY ANGIOGRAPHY N/A 10/24/2016   Procedure: LEFT HEART CATH AND CORONARY ANGIOGRAPHY;  Surgeon: Runell Gess, MD;  Location: [facility] INVASIVE CV LAB;  Service: Cardiovascular;  Laterality: N/A;   Current Outpatient Medications on File Prior to Visit  Medication Sig Dispense Refill  . aspirin EC 81 MG tablet Take 81 mg by mouth daily.    Marland Kitchen atorvastatin (LIPITOR) 80 MG tablet Take 1 tablet (80 mg total) by mouth daily at 6 PM. 90 tablet 2  . cycloSPORINE (RESTASIS) 0.05 % ophthalmic emulsion Place 1 drop into both eyes 2 (two) times daily.    . diclofenac sodium (VOLTAREN) 1 % GEL Apply 1 application topically daily as needed (for pain).    . furosemide (LASIX) 20 MG tablet Take 1 tablet (20 mg total) by mouth daily as needed. (Patient taking differently: Take 20 mg by mouth daily as needed for fluid. ) 90 tablet 3  . isosorbide mononitrate (IMDUR) 30 MG 24 hr tablet Take 1 tablet (30 mg total) by mouth daily. 90 tablet 3  . levothyroxine (SYNTHROID, LEVOTHROID) 88 MCG tablet TAKE 1 TABLET DAILY BEFORE BREAKFAST (Patient taking differently: Take 88 mcg by mouth daily before breakfast) 90 tablet 2  . lidocaine (LIDODERM) 5 % Use up to 3 patches on skin daily as needed for pain  4  . lisinopril (PRINIVIL,ZESTRIL) 20 MG tablet Take 1 tablet (20 mg  total) by mouth daily. 90 tablet 2  . metoprolol succinate (TOPROL-XL) 50 MG 24 hr tablet Take 1 tablet (50 mg total) by mouth daily. Take with or immediately following a meal. 90 tablet 3  . morphine (MS CONTIN) 15 MG 12 hr tablet Take 15 mg by mouth See admin instructions. Take 15 mg by mouth in the morning with 30 mg to equal 45 mg dose  0  . morphine (MS CONTIN) 30 MG 12 hr tablet Take 30 mg by mouth See admin instructions. Takes 30 mg by mouth in the morning with 15 mg to equal 45 mg dose, 30 mg in the afternoon, and 30 mg at night    . nitroGLYCERIN (NITROSTAT) 0.4 MG SL tablet Place 1 tablet (0.4 mg total) under the tongue every 5 (five) minutes as needed for chest pain. 25 tablet 3  .  Nutritional Supplements (JUICE PLUS FIBRE PO) Take 2 capsules by mouth 2 (two) times daily.    . pantoprazole (PROTONIX) 40 MG tablet Take 1 tablet (40 mg total) by mouth daily. (Patient taking differently: Take 40 mg by mouth 4 (four) times a week. ) 90 tablet 2  . temazepam (RESTORIL) 15 MG capsule Take 1 capsule (15 mg total) at bedtime as needed by mouth for sleep. (Patient taking differently: Take 15 mg by mouth See admin instructions. Take 15 mg by mouth every other night) 45 capsule 1  . traZODone (DESYREL) 50 MG tablet Take 50 mg by mouth at bedtime.   2  . clopidogrel (PLAVIX) 75 MG tablet Take 1 tablet (75 mg total) by mouth daily. (Patient not taking: Reported on 02/25/2017) 90 tablet 2  . spironolactone (ALDACTONE) 25 MG tablet Take 1 tablet (25 mg total) by mouth daily. (Patient not taking: Reported on 02/25/2017) 90 tablet 2   No current facility-administered medications on file prior to visit.    Allergies  Allergen Reactions  . Other Other (See Comments)    Seasonal allergies - sniffling    Social History   Socioeconomic History  . Marital status: Divorced    Spouse name: Not on file  . Number of children: Not on file  . Years of education: Not on file  . Highest education level: Not on file  Social Needs  . Financial resource strain: Not on file  . Food insecurity - worry: Not on file  . Food insecurity - inability: Not on file  . Transportation needs - medical: Not on file  . Transportation needs - non-medical: Not on file  Occupational History  . Not on file  Tobacco Use  . Smoking status: Former Smoker    Packs/day: 1.00    Years: 50.00    Pack years: 50.00    Types: Cigarettes    Last attempt to quit: 06/10/2011    Years since quitting: 5.7  . Smokeless tobacco: Never Used  Substance and Sexual Activity  . Alcohol use: Yes  . Drug use: No  . Sexual activity: Not on file  Other Topics Concern  . Not on file  Social History Narrative   Entered 12/2013:    Lives with his Sister, Niece, and Mother   They take turns caring for mother, who is [id number]   Quit smoking in 2013      Review of Systems  All other systems reviewed and are negative.      Objective:   Physical Exam  Cardiovascular: Normal rate, regular rhythm and normal heart sounds.  Pulmonary/Chest: Effort normal and  breath sounds normal. No respiratory distress. He has no wheezes. He has no rales.  Abdominal: Soft. Bowel sounds are normal.  Musculoskeletal: He exhibits no edema.       Left upper arm: He exhibits swelling and deformity.       Arms: Skin: No erythema.  Vitals reviewed.         Assessment & Plan:  Hematoma of arm, left, initial encounter  Fortunately, the hematoma is already regressing.  The swelling is subsided.  The pain has resolved.  It is starting to fade in color and turn yellow and brown.  No further treatment is necessary.  I did recommend compressive therapy using an Ace bandage and warm compresses 2-3 times a day to facilitate resolution of the hematoma.  He is holding Plavix at the present time in preparation for defibrillator placement next week.  I believe this is also helping the hematoma to resolve.  I did give the patient a prescription for Keflex.  If the hematoma become secondarily infected as demonstrated by redness, warmth, or pain, he is to start the antibiotic.  I gave him the antibiotic today because there is an impending snowstorm and he has surgery planned for next week.  However he will not get the antibiotic unless those symptoms develop

## 2017-03-02 ENCOUNTER — Encounter: Payer: Self-pay | Admitting: Internal Medicine

## 2017-03-03 ENCOUNTER — Encounter: Payer: Self-pay | Admitting: Internal Medicine

## 2017-03-05 ENCOUNTER — Ambulatory Visit (INDEPENDENT_AMBULATORY_CARE_PROVIDER_SITE_OTHER): Payer: BC Managed Care – PPO | Admitting: Internal Medicine

## 2017-03-05 ENCOUNTER — Encounter: Payer: Self-pay | Admitting: Internal Medicine

## 2017-03-05 VITALS — BP 112/92 | HR 64 | Ht 70.0 in | Wt 170.4 lb

## 2017-03-05 DIAGNOSIS — I4901 Ventricular fibrillation: Secondary | ICD-10-CM | POA: Diagnosis not present

## 2017-03-05 NOTE — H&P (View-Only) (Signed)
PCP: Dorena Bodoixon, Mary B, PA-C Primary Cardiologist: Dr Allyson SabalBerry Primary EP: Dr Bosie HelperAllred  Tommy Robbins is a 71 y.o. male who presents today for routine electrophysiology followup.  Since last being seen in our clinic, the patient reports doing very well.  He was planned for ICD implant 02/23/17.  This was cancelled due to L arm swelling and pain after bicep tendon tear.  He has since followed up with his PCP.  He still has L arm swelling but feels that this is improving.  Denies redness,warmth, fevers, or chills. Today, he denies symptoms of palpitations, chest pain, shortness of breath,  lower extremity edema, dizziness, presyncope, or syncope.  The patient is otherwise without complaint today.   Past Medical History:  Diagnosis Date  . Cataract   . Cellulitis and abscess of right leg 10/06/2016   right thigh  . Chronic low back pain   . Colon polyps   . Depression   . Diverticulosis   . GERD (gastroesophageal reflux disease)   . Hep C w/ coma, chronic (HCC)   . Hypertension   . Insomnia   . Thyroid disease    hypothyroid   Past Surgical History:  Procedure Laterality Date  . IABP INSERTION N/A 10/24/2016   Procedure: IABP Insertion;  Surgeon: Runell GessBerry, Jonathan J, MD;  [facility];  Service: Cardiovascular;  Laterality: N/A;  . JOINT REPLACEMENT Right 03/23/2008   Shoulder- Replacement  . LEFT HEART CATH AND CORONARY ANGIOGRAPHY N/A 10/24/2016   Procedure: LEFT HEART CATH AND CORONARY ANGIOGRAPHY;  Surgeon: Runell GessBerry, Jonathan J, MD;  Location: [facility] INVASIVE CV LAB;  Service: Cardiovascular;  Laterality: N/A;    ROS- all systems are reviewed and negatives except as per HPI above  Current Outpatient Medications  Medication Sig Dispense Refill  . aspirin EC 81 MG tablet Take 81 mg by mouth daily.    Marland Kitchen. atorvastatin (LIPITOR) 80 MG tablet Take 1 tablet (80 mg total) by mouth daily at 6 PM. 90 tablet 2  . cycloSPORINE (RESTASIS) 0.05 % ophthalmic emulsion Place 1 drop into both eyes 2  (two) times daily.    . diclofenac sodium (VOLTAREN) 1 % GEL Apply 1 application topically daily as needed (for pain).    . furosemide (LASIX) 20 MG tablet Take 1 tablet (20 mg total) by mouth daily as needed. (Patient taking differently: Take 20 mg by mouth daily as needed for fluid. ) 90 tablet 3  . gabapentin (NEURONTIN) 100 MG capsule Take 100 mg by mouth daily. Take 1 or 2 capsules daily by mouth at bedtime  2  . isosorbide mononitrate (IMDUR) 30 MG 24 hr tablet Take 1 tablet (30 mg total) by mouth daily. 90 tablet 3  . levothyroxine (SYNTHROID, LEVOTHROID) 88 MCG tablet TAKE 1 TABLET DAILY BEFORE BREAKFAST (Patient taking differently: Take 88 mcg by mouth daily before breakfast) 90 tablet 2  . lidocaine (LIDODERM) 5 % Use up to 3 patches on skin daily as needed for pain  4  . lisinopril (PRINIVIL,ZESTRIL) 20 MG tablet Take 1 tablet (20 mg total) by mouth daily. 90 tablet 2  . morphine (MS CONTIN) 15 MG 12 hr tablet Take 15 mg by mouth See admin instructions. Take 15 mg by mouth in the morning with 30 mg to equal 45 mg dose  0  . morphine (MS CONTIN) 30 MG 12 hr tablet Take 30 mg by mouth See admin instructions. Takes 30 mg by mouth in the morning with 15 mg to  equal 45 mg dose, 30 mg in the afternoon, and 30 mg at night    . nitroGLYCERIN (NITROSTAT) 0.4 MG SL tablet Place 1 tablet (0.4 mg total) under the tongue every 5 (five) minutes as needed for chest pain. 25 tablet 3  . Nutritional Supplements (JUICE PLUS FIBRE PO) Take 2 capsules by mouth 2 (two) times daily.    . pantoprazole (PROTONIX) 40 MG tablet Take 1 tablet (40 mg total) by mouth daily. 90 tablet 2  . temazepam (RESTORIL) 15 MG capsule Take 1 capsule (15 mg total) at bedtime as needed by mouth for sleep. (Patient taking differently: Take 15 mg by mouth See admin instructions. Take 15 mg by mouth every other night) 45 capsule 1  . traZODone (DESYREL) 50 MG tablet Take 50 mg by mouth at bedtime.   2  . cephALEXin (KEFLEX) 500 MG  capsule Take 1 capsule (500 mg total) by mouth 3 (three) times daily. (Patient not taking: Reported on 03/05/2017) 21 capsule 0  . clopidogrel (PLAVIX) 75 MG tablet Take 1 tablet (75 mg total) by mouth daily. (Patient not taking: Reported on 02/25/2017) 90 tablet 2  . metoprolol succinate (TOPROL-XL) 50 MG 24 hr tablet Take 1 tablet (50 mg total) by mouth daily. Take with or immediately following a meal. 90 tablet 3  . spironolactone (ALDACTONE) 25 MG tablet Take 1 tablet (25 mg total) by mouth daily. 90 tablet 2   No current facility-administered medications for this visit.     Physical Exam: Vitals:   03/05/17 1143  BP: (!) 112/92  Pulse: 64  SpO2: 96%  Weight: 170 lb 6.4 oz (77.3 kg)  Height: 5\' 10"  (1.778 m)    GEN- The patient is well appearing, alert and oriented x 3 today.   Head- normocephalic, atraumatic Eyes-  Sclera clear, conjunctiva pink Ears- hearing intact Oropharynx- clear Lungs- Clear to ausculation bilaterally, normal work of breathing Heart- Regular rate and rhythm, no murmurs, rubs or gallops, PMI not laterally displaced GI- soft, NT, ND, + BS Extremities- no clubbing, cyanosis,  + L forearm ecchymosis with pitting edema.  2+ radial/ ulnar pulses, good cap refill/ sensation, + moderate hematoma   Assessment and Plan:  1. S/p VF arrest Will plan to proceed with ICD at the next available time, once his L arm edema is improved.  No driving x 6 months Continue current medical therapy  Given narrow QRS, he is not a candidate for CRT.  Given bradycardia, I would plan to place an atrial lead.  Hillis Range MD, [facility] 03/05/2017 11:55 AM

## 2017-03-05 NOTE — Patient Instructions (Signed)
Medication Instructions:  Restart Plavix today---will hold 5 days prior to ICD implant  Labwork: Your physician recommends that you have lab work at the hospital   Testing/Procedures: Your physician has recommended that you have a defibrillator inserted. An implantable cardioverter defibrillator (ICD) is a small device that is placed in your chest or, in rare cases, your abdomen. This device uses electrical pulses or shocks to help control life-threatening, irregular heartbeats that could lead the heart to suddenly stop beating (sudden cardiac arrest). Leads are attached to the ICD that goes into your heart. This is done in the hospital and usually requires an overnight stay. Please see the instruction sheet given to you today for more information.---03/24/17  Please arrive at [facility] Entr[facility] of [facility] at 10am Do not eat or drink after midnight the night prior to the procedure Do not take any medications the morning of the test Take last dose of PLAVIX on 03/17/18 Use scrub according to directions Plan for one night stay Will need someone to drive you home at discharge    Follow-Up:  Your physician recommends that you schedule a follow-up appointment in: 10-14 days from 03/24/17 in device clinic for wound check and 3 months from 03/24/17 with Dr Johney Frame   Any Other Special Instructions Will Be Listed Below (If Applicable).     If you need a refill on your cardiac medications before your next appointment, please call your pharmacy.

## 2017-03-05 NOTE — Progress Notes (Signed)
 PCP: Dixon, Mary B, PA-C Primary Cardiologist: Dr Berry Primary EP: Dr Kaila Devries  Tommy Robbins is a 71 y.o. male who presents today for routine electrophysiology followup.  Since last being seen in our clinic, the patient reports doing very well.  He was planned for ICD implant 02/23/17.  This was cancelled due to L arm swelling and pain after bicep tendon tear.  He has since followed up with his PCP.  He still has L arm swelling but feels that this is improving.  Denies redness,warmth, fevers, or chills. Today, he denies symptoms of palpitations, chest pain, shortness of breath,  lower extremity edema, dizziness, presyncope, or syncope.  The patient is otherwise without complaint today.   Past Medical History:  Diagnosis Date  . Cataract   . Cellulitis and abscess of right leg 10/06/2016   right thigh  . Chronic low back pain   . Colon polyps   . Depression   . Diverticulosis   . GERD (gastroesophageal reflux disease)   . Hep C w/ coma, chronic (HCC)   . Hypertension   . Insomnia   . Thyroid disease    hypothyroid   Past Surgical History:  Procedure Laterality Date  . IABP INSERTION N/A 10/24/2016   Procedure: IABP Insertion;  Surgeon: Berry, Jonathan J, MD;  Location: [facility] INVASIVE CV LAB;  Service: Cardiovascular;  Laterality: N/A;  . JOINT REPLACEMENT Right 03/23/2008   Shoulder- Replacement  . LEFT HEART CATH AND CORONARY ANGIOGRAPHY N/A 10/24/2016   Procedure: LEFT HEART CATH AND CORONARY ANGIOGRAPHY;  Surgeon: Berry, Jonathan J, MD;  Location: [facility] INVASIVE CV LAB;  Service: Cardiovascular;  Laterality: N/A;    ROS- all systems are reviewed and negatives except as per HPI above  Current Outpatient Medications  Medication Sig Dispense Refill  . aspirin EC 81 MG tablet Take 81 mg by mouth daily.    . atorvastatin (LIPITOR) 80 MG tablet Take 1 tablet (80 mg total) by mouth daily at 6 PM. 90 tablet 2  . cycloSPORINE (RESTASIS) 0.05 % ophthalmic emulsion Place 1 drop into both eyes 2  (two) times daily.    . diclofenac sodium (VOLTAREN) 1 % GEL Apply 1 application topically daily as needed (for pain).    . furosemide (LASIX) 20 MG tablet Take 1 tablet (20 mg total) by mouth daily as needed. (Patient taking differently: Take 20 mg by mouth daily as needed for fluid. ) 90 tablet 3  . gabapentin (NEURONTIN) 100 MG capsule Take 100 mg by mouth daily. Take 1 or 2 capsules daily by mouth at bedtime  2  . isosorbide mononitrate (IMDUR) 30 MG 24 hr tablet Take 1 tablet (30 mg total) by mouth daily. 90 tablet 3  . levothyroxine (SYNTHROID, LEVOTHROID) 88 MCG tablet TAKE 1 TABLET DAILY BEFORE BREAKFAST (Patient taking differently: Take 88 mcg by mouth daily before breakfast) 90 tablet 2  . lidocaine (LIDODERM) 5 % Use up to 3 patches on skin daily as needed for pain  4  . lisinopril (PRINIVIL,ZESTRIL) 20 MG tablet Take 1 tablet (20 mg total) by mouth daily. 90 tablet 2  . morphine (MS CONTIN) 15 MG 12 hr tablet Take 15 mg by mouth See admin instructions. Take 15 mg by mouth in the morning with 30 mg to equal 45 mg dose  0  . morphine (MS CONTIN) 30 MG 12 hr tablet Take 30 mg by mouth See admin instructions. Takes 30 mg by mouth in the morning with 15 mg to   equal 45 mg dose, 30 mg in the afternoon, and 30 mg at night    . nitroGLYCERIN (NITROSTAT) 0.4 MG SL tablet Place 1 tablet (0.4 mg total) under the tongue every 5 (five) minutes as needed for chest pain. 25 tablet 3  . Nutritional Supplements (JUICE PLUS FIBRE PO) Take 2 capsules by mouth 2 (two) times daily.    . pantoprazole (PROTONIX) 40 MG tablet Take 1 tablet (40 mg total) by mouth daily. 90 tablet 2  . temazepam (RESTORIL) 15 MG capsule Take 1 capsule (15 mg total) at bedtime as needed by mouth for sleep. (Patient taking differently: Take 15 mg by mouth See admin instructions. Take 15 mg by mouth every other night) 45 capsule 1  . traZODone (DESYREL) 50 MG tablet Take 50 mg by mouth at bedtime.   2  . cephALEXin (KEFLEX) 500 MG  capsule Take 1 capsule (500 mg total) by mouth 3 (three) times daily. (Patient not taking: Reported on 03/05/2017) 21 capsule 0  . clopidogrel (PLAVIX) 75 MG tablet Take 1 tablet (75 mg total) by mouth daily. (Patient not taking: Reported on 02/25/2017) 90 tablet 2  . metoprolol succinate (TOPROL-XL) 50 MG 24 hr tablet Take 1 tablet (50 mg total) by mouth daily. Take with or immediately following a meal. 90 tablet 3  . spironolactone (ALDACTONE) 25 MG tablet Take 1 tablet (25 mg total) by mouth daily. 90 tablet 2   No current facility-administered medications for this visit.     Physical Exam: Vitals:   03/05/17 1143  BP: (!) 112/92  Pulse: 64  SpO2: 96%  Weight: 170 lb 6.4 oz (77.3 kg)  Height: 5\' 10"  (1.778 m)    GEN- The patient is well appearing, alert and oriented x 3 today.   Head- normocephalic, atraumatic Eyes-  Sclera clear, conjunctiva pink Ears- hearing intact Oropharynx- clear Lungs- Clear to ausculation bilaterally, normal work of breathing Heart- Regular rate and rhythm, no murmurs, rubs or gallops, PMI not laterally displaced GI- soft, NT, ND, + BS Extremities- no clubbing, cyanosis,  + L forearm ecchymosis with pitting edema.  2+ radial/ ulnar pulses, good cap refill/ sensation, + moderate hematoma   Assessment and Plan:  1. S/p VF arrest Will plan to proceed with ICD at the next available time, once his L arm edema is improved.  No driving x 6 months Continue current medical therapy  Given narrow QRS, he is not a candidate for CRT.  Given bradycardia, I would plan to place an atrial lead.  Hillis Range MD, [facility] 03/05/2017 11:55 AM

## 2017-03-10 ENCOUNTER — Other Ambulatory Visit: Payer: Self-pay | Admitting: *Deleted

## 2017-03-24 ENCOUNTER — Ambulatory Visit (HOSPITAL_COMMUNITY)
Admission: RE | Admit: 2017-03-24 | Discharge: 2017-03-25 | Disposition: A | Discharge status: Home / Self Care | Payer: BC Managed Care – PPO | Source: Ambulatory Visit | Attending: Internal Medicine | Admitting: Internal Medicine

## 2017-03-24 ENCOUNTER — Encounter (HOSPITAL_COMMUNITY): Payer: Self-pay | Admitting: General Practice

## 2017-03-24 ENCOUNTER — Encounter (HOSPITAL_COMMUNITY)
Admission: RE | Disposition: A | Discharge status: Home / Self Care | Payer: Self-pay | Source: Ambulatory Visit | Attending: Internal Medicine

## 2017-03-24 DIAGNOSIS — I110 Hypertensive heart disease with heart failure: Secondary | ICD-10-CM | POA: Diagnosis not present

## 2017-03-24 DIAGNOSIS — B182 Chronic viral hepatitis C: Secondary | ICD-10-CM | POA: Diagnosis not present

## 2017-03-24 DIAGNOSIS — G4700 Insomnia, unspecified: Secondary | ICD-10-CM | POA: Diagnosis not present

## 2017-03-24 DIAGNOSIS — Z96611 Presence of right artificial shoulder joint: Secondary | ICD-10-CM | POA: Insufficient documentation

## 2017-03-24 DIAGNOSIS — Z79891 Long term (current) use of opiate analgesic: Secondary | ICD-10-CM | POA: Insufficient documentation

## 2017-03-24 DIAGNOSIS — Z79899 Other long term (current) drug therapy: Secondary | ICD-10-CM | POA: Insufficient documentation

## 2017-03-24 DIAGNOSIS — Z006 Encounter for examination for normal comparison and control in clinical research program: Secondary | ICD-10-CM | POA: Diagnosis not present

## 2017-03-24 DIAGNOSIS — G8929 Other chronic pain: Secondary | ICD-10-CM | POA: Diagnosis not present

## 2017-03-24 DIAGNOSIS — K219 Gastro-esophageal reflux disease without esophagitis: Secondary | ICD-10-CM | POA: Insufficient documentation

## 2017-03-24 DIAGNOSIS — I2510 Atherosclerotic heart disease of native coronary artery without angina pectoris: Secondary | ICD-10-CM | POA: Diagnosis not present

## 2017-03-24 DIAGNOSIS — M545 Low back pain: Secondary | ICD-10-CM | POA: Diagnosis not present

## 2017-03-24 DIAGNOSIS — Z7982 Long term (current) use of aspirin: Secondary | ICD-10-CM | POA: Insufficient documentation

## 2017-03-24 DIAGNOSIS — E039 Hypothyroidism, unspecified: Secondary | ICD-10-CM | POA: Insufficient documentation

## 2017-03-24 DIAGNOSIS — I255 Ischemic cardiomyopathy: Secondary | ICD-10-CM | POA: Diagnosis not present

## 2017-03-24 DIAGNOSIS — Z8674 Personal history of sudden cardiac arrest: Secondary | ICD-10-CM | POA: Insufficient documentation

## 2017-03-24 DIAGNOSIS — I509 Heart failure, unspecified: Secondary | ICD-10-CM | POA: Diagnosis not present

## 2017-03-24 DIAGNOSIS — Z959 Presence of cardiac and vascular implant and graft, unspecified: Secondary | ICD-10-CM

## 2017-03-24 DIAGNOSIS — Z95810 Presence of automatic (implantable) cardiac defibrillator: Secondary | ICD-10-CM

## 2017-03-24 HISTORY — DX: Personal history of sudden cardiac arrest: Z86.74

## 2017-03-24 HISTORY — DX: Other psychoactive substance abuse, in remission: F19.11

## 2017-03-24 HISTORY — DX: Unspecified viral hepatitis C without hepatic coma: B19.20

## 2017-03-24 HISTORY — DX: Presence of automatic (implantable) cardiac defibrillator: Z95.810

## 2017-03-24 HISTORY — PX: ICD IMPLANT: EP1208

## 2017-03-24 HISTORY — PX: CARDIAC DEFIBRILLATOR PLACEMENT: SHX171

## 2017-03-24 LAB — BASIC METABOLIC PANEL
Anion gap: 4 — ABNORMAL LOW (ref 5–15)
BUN: 12 mg/dL (ref 6–20)
CALCIUM: 8.6 mg/dL — AB (ref 8.9–10.3)
CO2: 26 mmol/L (ref 22–32)
CREATININE: 0.82 mg/dL (ref 0.61–1.24)
Chloride: 106 mmol/L (ref 101–111)
GFR calc Af Amer: >60 mL/min (ref >60)
Glucose, Bld: 101 mg/dL — ABNORMAL HIGH (ref 65–99)
Potassium: 4.8 mmol/L (ref 3.5–5.1)
SODIUM: 136 mmol/L (ref 135–145)

## 2017-03-24 LAB — CBC
HCT: 39 % (ref 39.0–52.0)
Hemoglobin: 13.1 g/dL (ref 13.0–17.0)
MCH: 30 pg (ref 26.0–34.0)
MCHC: 33.6 g/dL (ref 30.0–36.0)
MCV: 89.4 fL (ref 78.0–100.0)
PLATELETS: 112 10*3/uL — AB (ref 150–400)
RBC: 4.36 MIL/uL (ref 4.22–5.81)
RDW: 13.4 % (ref 11.5–15.5)
WBC: 3.5 10*3/uL — AB (ref 4.0–10.5)

## 2017-03-24 SURGERY — ICD IMPLANT

## 2017-03-24 MED ORDER — FENTANYL CITRATE (PF) 100 MCG/2ML IJ SOLN
INTRAMUSCULAR | Status: AC
Start: 2017-03-24 — End: 2017-03-24
  Filled 2017-03-24: qty 2

## 2017-03-24 MED ORDER — LIDOCAINE HCL (PF) 1 % IJ SOLN
INTRAMUSCULAR | Status: AC
  Filled 2017-03-24: qty 60

## 2017-03-24 MED ORDER — LEVOTHYROXINE SODIUM 88 MCG PO TABS
88.0000 ug | ORAL_TABLET | Freq: Every day | ORAL | Status: DC
  Administered 2017-03-25: 88 ug via ORAL
  Filled 2017-03-24: qty 1

## 2017-03-24 MED ORDER — CLOPIDOGREL BISULFATE 75 MG PO TABS
75.0000 mg | ORAL_TABLET | Freq: Every day | ORAL | Status: DC
  Filled 2017-03-24: qty 1

## 2017-03-24 MED ORDER — CHLORHEXIDINE GLUCONATE 4 % EX LIQD
60.0000 mL | Freq: Once | CUTANEOUS | Status: DC
  Filled 2017-03-24: qty 60

## 2017-03-24 MED ORDER — MUPIROCIN 2 % EX OINT
TOPICAL_OINTMENT | CUTANEOUS | Status: AC
  Filled 2017-03-24: qty 22

## 2017-03-24 MED ORDER — SODIUM CHLORIDE 0.9 % IR SOLN
80.0000 mg | Status: AC
  Administered 2017-03-24: 80 mg

## 2017-03-24 MED ORDER — ACETAMINOPHEN 325 MG PO TABS: 325.0000 mg | ORAL_TABLET | ORAL | Status: DC | PRN

## 2017-03-24 MED ORDER — SODIUM CHLORIDE 0.9 % IV SOLN
INTRAVENOUS | Status: DC
  Administered 2017-03-24: 11:00:00 via INTRAVENOUS

## 2017-03-24 MED ORDER — MORPHINE SULFATE ER 15 MG PO TBCR: 30.0000 mg | EXTENDED_RELEASE_TABLET | ORAL | Status: DC

## 2017-03-24 MED ORDER — MUPIROCIN 2 % EX OINT: 1.0000 "application " | TOPICAL_OINTMENT | Freq: Once | CUTANEOUS | Status: DC

## 2017-03-24 MED ORDER — CEFAZOLIN SODIUM-DEXTROSE 2-4 GM/100ML-% IV SOLN
INTRAVENOUS | Status: AC
  Filled 2017-03-24: qty 100

## 2017-03-24 MED ORDER — HYDROCODONE-ACETAMINOPHEN 5-325 MG PO TABS
1.0000 | ORAL_TABLET | ORAL | Status: DC | PRN
  Administered 2017-03-24: 2 via ORAL
  Filled 2017-03-24: qty 2

## 2017-03-24 MED ORDER — SPIRONOLACTONE 25 MG PO TABS
25.0000 mg | ORAL_TABLET | Freq: Every day | ORAL | Status: DC
  Administered 2017-03-25: 25 mg via ORAL
  Filled 2017-03-24: qty 1

## 2017-03-24 MED ORDER — TRAZODONE HCL 50 MG PO TABS
25.0000 mg | ORAL_TABLET | Freq: Every day | ORAL | Status: DC
  Administered 2017-03-24: 25 mg via ORAL
  Filled 2017-03-24: qty 1

## 2017-03-24 MED ORDER — HEPARIN (PORCINE) IN NACL 2-0.9 UNIT/ML-% IJ SOLN
INTRAMUSCULAR | Status: AC | PRN
  Administered 2017-03-24: 500 mL

## 2017-03-24 MED ORDER — CEFAZOLIN SODIUM-DEXTROSE 1-4 GM/50ML-% IV SOLN
1.0000 g | Freq: Four times a day (QID) | INTRAVENOUS | Status: AC
  Administered 2017-03-24 – 2017-03-25 (×3): 1 g via INTRAVENOUS
  Filled 2017-03-24 (×3): qty 50

## 2017-03-24 MED ORDER — SODIUM CHLORIDE 0.9% FLUSH: 3.0000 mL | INTRAVENOUS | Status: DC | PRN

## 2017-03-24 MED ORDER — SODIUM CHLORIDE 0.9% FLUSH
3.0000 mL | Freq: Two times a day (BID) | INTRAVENOUS | Status: DC
  Administered 2017-03-24 – 2017-03-25 (×2): 3 mL via INTRAVENOUS

## 2017-03-24 MED ORDER — MORPHINE SULFATE ER 15 MG PO TBCR
45.0000 mg | EXTENDED_RELEASE_TABLET | Freq: Every day | ORAL | Status: DC
  Administered 2017-03-25: 45 mg via ORAL
  Filled 2017-03-24: qty 3

## 2017-03-24 MED ORDER — TEMAZEPAM 15 MG PO CAPS: 15.0000 mg | ORAL_CAPSULE | Freq: Every evening | ORAL | Status: DC | PRN

## 2017-03-24 MED ORDER — DIAZEPAM 5 MG PO TABS: 10.0000 mg | ORAL_TABLET | Freq: Every evening | ORAL | Status: DC | PRN

## 2017-03-24 MED ORDER — FENTANYL CITRATE (PF) 100 MCG/2ML IJ SOLN
INTRAMUSCULAR | Status: DC | PRN
  Administered 2017-03-24: 25 ug via INTRAVENOUS
  Administered 2017-03-24 (×2): 12.5 ug via INTRAVENOUS

## 2017-03-24 MED ORDER — LISINOPRIL 20 MG PO TABS
20.0000 mg | ORAL_TABLET | Freq: Every day | ORAL | Status: DC
  Administered 2017-03-25: 20 mg via ORAL
  Filled 2017-03-24: qty 1

## 2017-03-24 MED ORDER — CEFAZOLIN SODIUM-DEXTROSE 2-4 GM/100ML-% IV SOLN
2.0000 g | INTRAVENOUS | Status: AC
  Administered 2017-03-24: 2 g via INTRAVENOUS

## 2017-03-24 MED ORDER — MORPHINE SULFATE ER 15 MG PO TBCR
30.0000 mg | EXTENDED_RELEASE_TABLET | ORAL | Status: DC
  Administered 2017-03-24 (×2): 30 mg via ORAL
  Filled 2017-03-24 (×2): qty 2

## 2017-03-24 MED ORDER — ONDANSETRON HCL 4 MG/2ML IJ SOLN: 4.0000 mg | Freq: Four times a day (QID) | INTRAMUSCULAR | Status: DC | PRN

## 2017-03-24 MED ORDER — MIDAZOLAM HCL 5 MG/5ML IJ SOLN
INTRAMUSCULAR | Status: AC
  Filled 2017-03-24: qty 5

## 2017-03-24 MED ORDER — METOPROLOL SUCCINATE ER 50 MG PO TB24
50.0000 mg | ORAL_TABLET | Freq: Every day | ORAL | Status: DC
  Administered 2017-03-25: 50 mg via ORAL
  Filled 2017-03-24: qty 1

## 2017-03-24 MED ORDER — MORPHINE SULFATE ER 15 MG PO TBCR: 15.0000 mg | EXTENDED_RELEASE_TABLET | ORAL | Status: DC

## 2017-03-24 MED ORDER — ISOSORBIDE MONONITRATE ER 30 MG PO TB24
30.0000 mg | ORAL_TABLET | Freq: Every day | ORAL | Status: DC
  Administered 2017-03-25: 30 mg via ORAL
  Filled 2017-03-24: qty 1

## 2017-03-24 MED ORDER — ASPIRIN EC 81 MG PO TBEC
81.0000 mg | DELAYED_RELEASE_TABLET | Freq: Every day | ORAL | Status: DC
  Administered 2017-03-25: 81 mg via ORAL
  Filled 2017-03-24: qty 1

## 2017-03-24 MED ORDER — SODIUM CHLORIDE 0.9 % IV SOLN: 250.0000 mL | INTRAVENOUS | Status: DC | PRN

## 2017-03-24 MED ORDER — GABAPENTIN 100 MG PO CAPS
100.0000 mg | ORAL_CAPSULE | Freq: Every day | ORAL | Status: DC
  Administered 2017-03-24: 100 mg via ORAL
  Filled 2017-03-24: qty 1

## 2017-03-24 MED ORDER — LIDOCAINE HCL (PF) 1 % IJ SOLN
INTRAMUSCULAR | Status: DC | PRN
  Administered 2017-03-24: 80 mL

## 2017-03-24 MED ORDER — CYCLOSPORINE 0.05 % OP EMUL
1.0000 [drp] | Freq: Two times a day (BID) | OPHTHALMIC | Status: DC
  Administered 2017-03-24 – 2017-03-25 (×2): 1 [drp] via OPHTHALMIC
  Filled 2017-03-24 (×2): qty 1

## 2017-03-24 MED ORDER — MIDAZOLAM HCL 5 MG/5ML IJ SOLN
INTRAMUSCULAR | Status: DC | PRN
  Administered 2017-03-24: 2 mg via INTRAVENOUS
  Administered 2017-03-24 (×3): 1 mg via INTRAVENOUS
  Administered 2017-03-24: 2 mg via INTRAVENOUS

## 2017-03-24 MED ORDER — SODIUM CHLORIDE 0.9 % IR SOLN
Status: AC
  Filled 2017-03-24: qty 2

## 2017-03-24 SURGICAL SUPPLY — 8 items
CABLE SURGICAL S-101-97-12 (CABLE) ×1 IMPLANT
ICD EVERA DR XT MRI DDMB1D4 (ICD Generator) ×1 IMPLANT
LEAD CAPSURE NOVUS 5076-52CM (Lead) ×1 IMPLANT
LEAD SPRINT QUAT SEC 6935M-62 (Lead) ×2 IMPLANT
PAD DEFIB LIFELINK (PAD) ×2 IMPLANT
SHEATH CLASSIC 7F (SHEATH) ×1 IMPLANT
SHEATH CLASSIC 9F (SHEATH) ×1 IMPLANT
TRAY PACEMAKER INSERTION (PACKS) ×1 IMPLANT

## 2017-03-24 NOTE — Discharge Summary (Signed)
ELECTROPHYSIOLOGY PROCEDURE DISCHARGE SUMMARY    Patient ID: Tommy Robbins,  MRN: [id number], DOB/AGE: 11/14/1945 72 y.o.  Admit date: 03/24/2017 Discharge date: 03/25/2017  Primary Care Physician: Deon Pilling Primary Cardiologist: Allyson Sabal Electrophysiologist: Bethan Adamek  Primary Discharge Diagnosis:  Cardiac arrest s/p ICD implant this admission  Secondary Discharge Diagnosis:  1.  CAD 2.  Hep C 3.  HTN 4.  Hypothyroidism  Allergies  Allergen Reactions  . Other Other (See Comments)    Seasonal allergies - sniffling      Procedures This Admission:  1.  Implantation of a MDT dual chamber ICD on 03/24/17 by Dr Johney Frame.  The patient received a MDT model number Evera XT DR ICD with model number 5076 right atrial lead, 6935M right ventricular lead.  DFT's were deferred at time of implant/successful at 15 J.  There were no immediate post procedure complications. 2.  CXR on 03/25/17 will be reviewed prior to discharge  Brief HPI: Tommy Robbins is a 72 y.o. male who suffered resuscitated VF arrest in August of this year.  He was found to have 3V CAD and medical therapy was advised. The patient has persistent LV dysfunction despite guideline directed therapy.  Risks, benefits, and alternatives to ICD implantation were reviewed with the patient who wished to proceed.   Hospital Course:  The patient was admitted and underwent implantation of a MDT dual chamber ICD with details as outlined above. He was monitored on telemetry overnight which demonstrated sinus rhythm with PVC's.  Left chest was without hematoma or ecchymosis.  The device was interrogated and found to be functioning normally.  CXR was obtained and demonstrated no pneumothorax status post device implantation.  Wound care, arm mobility, and restrictions were reviewed with the patient.  The patient was examined and considered stable for discharge to home.   The patient's discharge medications include an ACE-I (Lisinopril) and  beta blocker (Metoprolol).   Physical Exam: Vitals:   03/24/17 1520 03/24/17 1553 03/24/17 2141 03/25/17 0522  BP: (!) 158/95 (!) 142/82 (!) 141/86 (!) 141/89  Pulse: 70 (!) 56 66 (!) 58  Resp: 19 (!) 21 16 17   Temp:  97.6 F (36.4 C) 98.2 F (36.8 C) (!) 97.5 F (36.4 C)  TempSrc:  Oral Oral Oral  SpO2: 98% 100% 96% 95%  Weight:  167 lb 8.8 oz (76 kg)  165 lb 14.4 oz (75.3 kg)  Height:  5\' 10"  (1.778 m)      GEN- The patient is well appearing, alert and oriented x 3 today.   HEENT: normocephalic, atraumatic; sclera clear, conjunctiva pink; hearing intact; oropharynx clear; neck supple Lungs- Clear to ausculation bilaterally, normal work of breathing.  No wheezes, rales, rhonchi Heart- Regular rate and rhythm, no murmurs, rubs or gallops  GI- soft, non-tender, non-distended, bowel sounds present Extremities- no clubbing, cyanosis, or edema  MS- no significant deformity or atrophy Skin- warm and dry, no rash or lesion, left chest without hematoma/ecchymosis Psych- euthymic mood, full affect Neuro- strength and sensation are intact   Labs:   Lab Results  Component Value Date   WBC 3.5 (L) 03/24/2017   HGB 13.1 03/24/2017   HCT 39.0 03/24/2017   MCV 89.4 03/24/2017   PLT 112 (L) 03/24/2017    Recent Labs  Lab 03/24/17 1124  NA 136  K 4.8  CL 106  CO2 26  BUN 12  CREATININE 0.82  CALCIUM 8.6*  GLUCOSE 101*    Discharge Medications:  Allergies as of 03/25/2017      Reactions   Other Other (See Comments)   Seasonal allergies - sniffling       Medication List    TAKE these medications   aspirin EC 81 MG tablet Take 81 mg by mouth daily.   atorvastatin 80 MG tablet Commonly known as:  LIPITOR Take 1 tablet (80 mg total) by mouth daily at 6 PM.   clopidogrel 75 MG tablet Commonly known as:  PLAVIX Take 1 tablet (75 mg total) by mouth daily. Resume 03/27/17 What changed:  additional instructions   cycloSPORINE 0.05 % ophthalmic emulsion Commonly known as:   RESTASIS Place 1 drop into both eyes 2 (two) times daily.   diclofenac sodium 1 % Gel Commonly known as:  VOLTAREN Apply 1 application topically daily as needed (for pain).   furosemide 20 MG tablet Commonly known as:  LASIX Take 1 tablet (20 mg total) by mouth daily as needed. What changed:  reasons to take this   gabapentin 100 MG capsule Commonly known as:  NEURONTIN Take 100 mg by mouth at bedtime. Take 1 or 2 capsules daily by mouth at bedtime   isosorbide mononitrate 30 MG 24 hr tablet Commonly known as:  IMDUR Take 1 tablet (30 mg total) by mouth daily.   JUICE PLUS FIBRE PO Take 2 capsules by mouth 2 (two) times daily.   levothyroxine 88 MCG tablet Commonly known as:  SYNTHROID, LEVOTHROID TAKE 1 TABLET DAILY BEFORE BREAKFAST What changed:    how much to take  how to take this  when to take this   lidocaine 5 % Commonly known as:  LIDODERM Use up to 3 patches on skin daily as needed for pain   lisinopril 20 MG tablet Commonly known as:  PRINIVIL,ZESTRIL Take 1 tablet (20 mg total) by mouth daily.   metoprolol succinate 50 MG 24 hr tablet Commonly known as:  TOPROL-XL Take 1 tablet (50 mg total) by mouth daily. Take with or immediately following a meal.   morphine 30 MG 12 hr tablet Commonly known as:  MS CONTIN Take 30 mg by mouth See admin instructions. Takes 30 mg by mouth in the morning with 15 mg to equal 45 mg dose, 30 mg in the afternoon, and 30 mg at night   morphine 15 MG 12 hr tablet Commonly known as:  MS CONTIN Take 15 mg by mouth See admin instructions. Take 15 mg by mouth in the morning with 30 mg to equal 45 mg dose   nitroGLYCERIN 0.4 MG SL tablet Commonly known as:  NITROSTAT Place 1 tablet (0.4 mg total) under the tongue every 5 (five) minutes as needed for chest pain.   pantoprazole 40 MG tablet Commonly known as:  PROTONIX Take 1 tablet (40 mg total) by mouth daily.   spironolactone 25 MG tablet Commonly known as:   ALDACTONE Take 1 tablet (25 mg total) by mouth daily.   temazepam 15 MG capsule Commonly known as:  RESTORIL Take 1 capsule (15 mg total) at bedtime as needed by mouth for sleep. What changed:    when to take this  additional instructions   traZODone 50 MG tablet Commonly known as:  DESYREL Take 25 mg by mouth at bedtime.       Disposition:  Discharge Instructions    Diet - low sodium heart healthy   Complete by:  As directed    Increase activity slowly   Complete by:  As directed  Follow-up Information    [facility] Follow up on 04/05/2017.   Specialty:  Cardiology Why:  at [facility] for wound check  Contact information: [address], [address] [place] [address] [phone]       Hillis Range, MD Follow up on 06/23/2017.   Specialty:  Cardiology Why:  at 11:45AM Contact information: [address] [place] [address] [phone]           Duration of Discharge Encounter: Greater than 30 minutes including physician time.  Signed, Gypsy Balsam, NP 03/25/2017 8:06 AM  I have seen, examined the patient, and reviewed the above assessment and plan.  Changes to above are made where necessary.   Device interrogation is reviewed and normal.  CXR reveals stable leads, no ptx.  Co Sign: Hillis Range, MD 03/25/2017 12:01 PM

## 2017-03-24 NOTE — Discharge Instructions (Signed)
° ° °  Supplemental Discharge Instructions for  Pacemaker/Defibrillator Patients  Activity No heavy lifting or vigorous activity with your left/right arm for 6 to 8 weeks.  Do not raise your left/right arm above your head for one week.  Gradually raise your affected arm as drawn below.           __             03/28/17                  03/29/17                     03/30/17                      03/31/17  NO DRIVING for   1 week  ; you may begin driving on   05/21/52  .  WOUND CARE - Keep the wound area clean and dry.  Do not get this area wet for one week. No showers for one week; you may shower on   03/31/17  . - The tape/steri-strips on your wound will fall off; do not pull them off.  No bandage is needed on the site.  DO  NOT apply any creams, oils, or ointments to the wound area. - If you notice any drainage or discharge from the wound, any swelling or bruising at the site, or you develop a fever > 101? F after you are discharged home, call the office at once.  Special Instructions - You are still able to use cellular telephones; use the ear opposite the side where you have your pacemaker/defibrillator.  Avoid carrying your cellular phone near your device. - When traveling through airports, show security personnel your identification card to avoid being screened in the metal detectors.  Ask the security personnel to use the hand wand. - Avoid arc welding equipment, MRI testing (magnetic resonance imaging), TENS units (transcutaneous nerve stimulators).  Call the office for questions about other devices. - Avoid electrical appliances that are in poor condition or are not properly grounded. - Microwave ovens are safe to be near or to operate.  Additional information for defibrillator patients should your device go off: - If your device goes off ONCE and you feel fine afterward, notify the device clinic nurses. - If your device goes off ONCE and you do not feel well afterward, call 911. - If your  device goes off TWICE, call 911. - If your device goes off THREE times in one day, call 911.  DO NOT DRIVE YOURSELF OR A FAMILY MEMBER WITH A DEFIBRILLATOR TO THE HOSPITAL--CALL 911.

## 2017-03-24 NOTE — Interval H&P Note (Signed)
History and Physical Interval Note:  03/24/2017 11:55 AM  Roddie Judie Petit Doody  has presented today for surgery, with the diagnosis of vfib; ischemic cardiomyopathy  The various methods of treatment have been discussed with the patient and family. After consideration of risks, benefits and other options for treatment, the patient has consented to  Procedure(s): ICD IMPLANT (N/A) as a surgical intervention .  The patient's history has been reviewed, patient examined, no change in status, stable for surgery.  I have reviewed the patient's chart and labs.  Questions were answered to the patient's satisfaction.    ICD Criteria  Current LVEF:35%. Within 12 months prior to implant: Yes   Heart failure history: Yes, Class II  Cardiomyopathy history: Yes, Ischemic Cardiomyopathy - Prior MI.  Atrial Fibrillation/Atrial Flutter: No.  Ventricular tachycardia history: VF arrest  Cardiac arrest history: Yes, Ventricular Fibrillation.  History of syndromes with risk of sudden death: No.  Previous ICD: No.  Current ICD indication: Primary  PPM indication: Yes. Pacing type: Atrial. Greater than 40% RV pacing requirement anticipated. Indication: Sick Sinus Syndrome   Class I or II Bradycardia indication present: Yes  Beta Blocker therapy for 3 or more months: Yes, prescribed.   Ace Inhibitor/ARB therapy for 3 or more months: Yes, prescribed.      Hillis Range

## 2017-03-25 ENCOUNTER — Encounter (HOSPITAL_COMMUNITY): Payer: Self-pay | Admitting: Internal Medicine

## 2017-03-25 ENCOUNTER — Ambulatory Visit (HOSPITAL_COMMUNITY): Payer: BC Managed Care – PPO

## 2017-03-25 DIAGNOSIS — B182 Chronic viral hepatitis C: Secondary | ICD-10-CM | POA: Diagnosis not present

## 2017-03-25 DIAGNOSIS — G4700 Insomnia, unspecified: Secondary | ICD-10-CM | POA: Diagnosis not present

## 2017-03-25 DIAGNOSIS — Z96611 Presence of right artificial shoulder joint: Secondary | ICD-10-CM | POA: Diagnosis not present

## 2017-03-25 DIAGNOSIS — G8929 Other chronic pain: Secondary | ICD-10-CM | POA: Diagnosis not present

## 2017-03-25 DIAGNOSIS — I110 Hypertensive heart disease with heart failure: Secondary | ICD-10-CM | POA: Diagnosis not present

## 2017-03-25 DIAGNOSIS — M545 Low back pain: Secondary | ICD-10-CM | POA: Diagnosis not present

## 2017-03-25 DIAGNOSIS — I509 Heart failure, unspecified: Secondary | ICD-10-CM | POA: Diagnosis not present

## 2017-03-25 DIAGNOSIS — Z79891 Long term (current) use of opiate analgesic: Secondary | ICD-10-CM | POA: Diagnosis not present

## 2017-03-25 DIAGNOSIS — I255 Ischemic cardiomyopathy: Secondary | ICD-10-CM | POA: Diagnosis not present

## 2017-03-25 DIAGNOSIS — Z959 Presence of cardiac and vascular implant and graft, unspecified: Secondary | ICD-10-CM

## 2017-03-25 DIAGNOSIS — E039 Hypothyroidism, unspecified: Secondary | ICD-10-CM | POA: Diagnosis not present

## 2017-03-25 DIAGNOSIS — Z7982 Long term (current) use of aspirin: Secondary | ICD-10-CM | POA: Diagnosis not present

## 2017-03-25 DIAGNOSIS — K219 Gastro-esophageal reflux disease without esophagitis: Secondary | ICD-10-CM | POA: Diagnosis not present

## 2017-03-25 DIAGNOSIS — Z8674 Personal history of sudden cardiac arrest: Secondary | ICD-10-CM | POA: Diagnosis not present

## 2017-03-25 DIAGNOSIS — Z006 Encounter for examination for normal comparison and control in clinical research program: Secondary | ICD-10-CM | POA: Diagnosis not present

## 2017-03-25 DIAGNOSIS — Z79899 Other long term (current) drug therapy: Secondary | ICD-10-CM | POA: Diagnosis not present

## 2017-03-25 DIAGNOSIS — I2510 Atherosclerotic heart disease of native coronary artery without angina pectoris: Secondary | ICD-10-CM | POA: Diagnosis not present

## 2017-03-25 MED ORDER — CLOPIDOGREL BISULFATE 75 MG PO TABS: 75.0000 mg | ORAL_TABLET | Freq: Every day | ORAL | 2 refills | Status: DC

## 2017-03-25 NOTE — Plan of Care (Signed)
  Progressing Clinical Measurements: Ability to maintain clinical measurements within normal limits will improve 03/25/2017 0331 - Progressing by Horris Latino, RN Note VSS.  No s/s of distress noted. Will remain free from infection 03/25/2017 0331 - Progressing by Horris Latino, RN Note Receiving scheduled IV antibiotics s/p ICD.

## 2017-04-02 ENCOUNTER — Ambulatory Visit (HOSPITAL_COMMUNITY)
Admission: RE | Admit: 2017-04-02 | Discharge: 2017-04-02 | Disposition: A | Discharge status: Home / Self Care | Payer: BC Managed Care – PPO | Source: Ambulatory Visit | Attending: Family Medicine | Admitting: Family Medicine

## 2017-04-02 ENCOUNTER — Other Ambulatory Visit: Payer: Self-pay

## 2017-04-02 ENCOUNTER — Emergency Department (HOSPITAL_COMMUNITY): Admission: EM | Admit: 2017-04-02 | Payer: BC Managed Care – PPO | Source: Other Acute Inpatient Hospital

## 2017-04-02 ENCOUNTER — Ambulatory Visit (INDEPENDENT_AMBULATORY_CARE_PROVIDER_SITE_OTHER): Payer: BC Managed Care – PPO | Admitting: Family Medicine

## 2017-04-02 ENCOUNTER — Encounter: Payer: Self-pay | Admitting: Family Medicine

## 2017-04-02 VITALS — BP 116/84 | HR 80 | Temp 98.0°F | Resp 18 | Ht 70.0 in | Wt 168.0 lb

## 2017-04-02 DIAGNOSIS — G4452 New daily persistent headache (NDPH): Secondary | ICD-10-CM

## 2017-04-02 DIAGNOSIS — J3489 Other specified disorders of nose and nasal sinuses: Secondary | ICD-10-CM | POA: Insufficient documentation

## 2017-04-02 DIAGNOSIS — R51 Headache: Secondary | ICD-10-CM

## 2017-04-02 DIAGNOSIS — G4489 Other headache syndrome: Secondary | ICD-10-CM | POA: Diagnosis present

## 2017-04-02 DIAGNOSIS — G319 Degenerative disease of nervous system, unspecified: Secondary | ICD-10-CM | POA: Insufficient documentation

## 2017-04-02 DIAGNOSIS — R519 Headache, unspecified: Secondary | ICD-10-CM

## 2017-04-02 DIAGNOSIS — B028 Zoster with other complications: Secondary | ICD-10-CM | POA: Diagnosis not present

## 2017-04-02 DIAGNOSIS — I6782 Cerebral ischemia: Secondary | ICD-10-CM | POA: Diagnosis not present

## 2017-04-02 MED ORDER — VALACYCLOVIR HCL 1 G PO TABS: 1000.0000 mg | ORAL_TABLET | Freq: Three times a day (TID) | ORAL | 0 refills | Status: DC

## 2017-04-02 NOTE — Patient Instructions (Addendum)
Take the valtrex Get the CT scan done  F/U pending results

## 2017-04-02 NOTE — Progress Notes (Signed)
   Subjective:    Patient ID: Tommy Robbins, male    DOB: 1945/09/09, 72 y.o.   MRN: [id number]  Patient presents for Pain (x4 days- sharp throbbing pain in L side of neck that radiates to head)   Has has pounding throbbing headache for the past 4 days. " Feels like something kicked him in the head" He had defibrillator placed  last week, had to sleep on right side, but no complications. Pain shoots from neck down to shoulder   No blurry vision, no vomiting, no nausea Took regular pain medicine with no improve Has constant pain Denies feeling dizzy or off balance   Taking Plavix, was off 1 prior to the surgery but has been back on since then    Review Of Systems:  GEN- denies fatigue, fever, weight loss,weakness, recent illness HEENT- denies eye drainage, change in vision, nasal discharge, CVS- denies chest pain, palpitations RESP- denies SOB, cough, wheeze ABD- denies N/V, change in stools, abd pain GU- denies dysuria, hematuria, dribbling, incontinence MSK- denies joint pain, muscle aches, injury Neuro-+headache, dizziness, syncope, seizure activity       Objective:    BP 116/84   Pulse 80   Temp 98 F (36.7 C) (Oral)   Resp 18   Ht 5\' 10"  (1.778 m)   Wt 168 lb (76.2 kg)   SpO2 96%   BMI 24.11 kg/m  GEN- NAD, alert and oriented x3 HEENT- PERRL, EOMI, non injected sclera, pink conjunctiva, MMM, oropharynx clear Neck- Supple, TTP left occiput, pain with lateral rotation CVS- RRR, no murmur RESP-CTAB MSK- Good ROM upper ext Skin- blistering rash on left shoulder, scattered erythematous lesions ant shoulder,tlo neck, no lesions at occiput, TTP along entire region Neuro-CNII-XII grossly in tact, no nystagmus, using all 4 ext EXT- No edema Pulses- Radial  2+        Assessment & Plan:      Problem List Items Addressed This Visit    None    Visit Diagnoses    Unilateral occipital headache    -  Primary   Shingles noted, but on blood thinner, complaining of  worst headache with recent surgical procedure. CT of head neg for bleed/stroke, tumor. treat shingles, add tylenol, he is on chronic opiates no NSAIDS with his blood thinners    Relevant Orders   CT Head Wo Contrast (Completed)   Herpes zoster with complication       Relevant Medications   valACYclovir (VALTREX) 1000 MG tablet   New daily persistent headache       Relevant Orders   CT Head Wo Contrast (Completed)   Other headache syndrome       Relevant Orders   CT Head Wo Contrast (Completed)      Note: This dictation was prepared with Dragon dictation along with smaller phrase technology. Any transcriptional errors that result from this process are unintentional.

## 2017-04-03 ENCOUNTER — Encounter: Payer: Self-pay | Admitting: Family Medicine

## 2017-04-05 ENCOUNTER — Ambulatory Visit (INDEPENDENT_AMBULATORY_CARE_PROVIDER_SITE_OTHER): Payer: BC Managed Care – PPO | Admitting: *Deleted

## 2017-04-05 DIAGNOSIS — I255 Ischemic cardiomyopathy: Secondary | ICD-10-CM

## 2017-04-05 LAB — CUP PACEART INCLINIC DEVICE CHECK
Battery Remaining Longevity: 132 mo
Brady Statistic AP VS Percent: 5.68 %
Brady Statistic AS VP Percent: 0.04 %
Brady Statistic AS VS Percent: 94.27 %
Date Time Interrogation Session: 20190114164925
HIGH POWER IMPEDANCE MEASURED VALUE: 56 Ohm
Implantable Lead Implant Date: 20190102
Implantable Lead Location: 753859
Implantable Lead Model: 5076
Implantable Pulse Generator Implant Date: 20190102
Lead Channel Impedance Value: 494 Ohm
Lead Channel Pacing Threshold Amplitude: 0.5 V
Lead Channel Pacing Threshold Amplitude: 0.625 V
Lead Channel Pacing Threshold Pulse Width: 0.4 ms
Lead Channel Sensing Intrinsic Amplitude: 13.5 mV
Lead Channel Sensing Intrinsic Amplitude: 2.125 mV
Lead Channel Sensing Intrinsic Amplitude: 2.875 mV
Lead Channel Setting Pacing Amplitude: 3.5 V
Lead Channel Setting Pacing Pulse Width: 0.4 ms
MDC IDC LEAD IMPLANT DT: 20190102
MDC IDC LEAD LOCATION: 753860
MDC IDC MSMT BATTERY VOLTAGE: 3.11 V
MDC IDC MSMT LEADCHNL RA PACING THRESHOLD PULSEWIDTH: 0.4 ms
MDC IDC MSMT LEADCHNL RV IMPEDANCE VALUE: 323 Ohm
MDC IDC MSMT LEADCHNL RV IMPEDANCE VALUE: 437 Ohm
MDC IDC MSMT LEADCHNL RV SENSING INTR AMPL: 12.625 mV
MDC IDC SET LEADCHNL RA PACING AMPLITUDE: 3.5 V
MDC IDC SET LEADCHNL RV SENSING SENSITIVITY: 0.3 mV
MDC IDC STAT BRADY AP VP PERCENT: 0.01 %
MDC IDC STAT BRADY RA PERCENT PACED: 5.65 %
MDC IDC STAT BRADY RV PERCENT PACED: 0.05 %

## 2017-04-05 NOTE — Progress Notes (Signed)
Wound check appointment. Steri-strips removed prior to ov. Wound without redness or edema. Incision edges approximated, wound well healed. Normal device function. Thresholds, sensing, and impedances consistent with implant measurements. Device programmed at 3.5V for extra safety margin until 3 month visit. Histogram distribution appropriate for patient and level of activity. No mode switches or ventricular arrhythmias noted. Patient educated about wound care, arm mobility, lifting restrictions, shock plan. ROV in 3 months with JA.

## 2017-04-21 ENCOUNTER — Ambulatory Visit (INDEPENDENT_AMBULATORY_CARE_PROVIDER_SITE_OTHER): Payer: BC Managed Care – PPO | Admitting: Internal Medicine

## 2017-04-21 ENCOUNTER — Encounter: Payer: Self-pay | Admitting: Internal Medicine

## 2017-04-21 DIAGNOSIS — B182 Chronic viral hepatitis C: Secondary | ICD-10-CM
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
## 2017-04-21 NOTE — Assessment & Plan Note (Signed)
He has chronic active hepatitis C and has failed the past treatments with interferon and ribavirin.  He is a candidate for retreatment with newer direct acting antiviral medications.  I discussed this option with him today.  However, he tells me that he feels like "longevity is not on my side" given his recent heart problems.  He tells me that he has been discussing whether or not to undergo retreatment with his family but has not made a decision yet.  I told him that he can call me if he decides to undergo treatment.  If so we will need to get further blood work and abdominal ultrasound to assess his genotype and whether or not he has any underlying liver disease.

## 2017-05-13 ENCOUNTER — Ambulatory Visit (INDEPENDENT_AMBULATORY_CARE_PROVIDER_SITE_OTHER): Payer: BC Managed Care – PPO | Admitting: Physician Assistant

## 2017-05-13 ENCOUNTER — Encounter: Payer: Self-pay | Admitting: Physician Assistant

## 2017-05-13 VITALS — BP 104/70 | HR 61 | Ht 70.0 in | Wt 171.0 lb

## 2017-05-13 DIAGNOSIS — Z95810 Presence of automatic (implantable) cardiac defibrillator: Secondary | ICD-10-CM

## 2017-05-13 DIAGNOSIS — Z720 Tobacco use: Secondary | ICD-10-CM

## 2017-05-13 DIAGNOSIS — E039 Hypothyroidism, unspecified: Secondary | ICD-10-CM

## 2017-05-13 DIAGNOSIS — I2510 Atherosclerotic heart disease of native coronary artery without angina pectoris: Secondary | ICD-10-CM

## 2017-05-13 DIAGNOSIS — I10 Essential (primary) hypertension: Secondary | ICD-10-CM

## 2017-05-13 DIAGNOSIS — I255 Ischemic cardiomyopathy: Secondary | ICD-10-CM

## 2017-05-13 DIAGNOSIS — B182 Chronic viral hepatitis C: Secondary | ICD-10-CM

## 2017-05-13 MED ORDER — VARENICLINE TARTRATE 1 MG PO TABS: 1.0000 mg | ORAL_TABLET | Freq: Two times a day (BID) | ORAL | 1 refills | Status: DC

## 2017-05-13 MED ORDER — VARENICLINE TARTRATE 0.5 MG X 11 & 1 MG X 42 PO MISC: ORAL | 0 refills | Status: DC

## 2017-05-13 NOTE — Progress Notes (Signed)
Cardiology Office Note    Date:  05/14/2017   ID:  Tommy Robbins, DOB 02/25/1946, MRN [id number]  PCP:  Dorena Bodo, PA-C  Cardiologist:  Dr. Allyson Sabal EP: Dr. Johney Frame   Chief Complaint  Patient presents with  . Follow-up    3 months  . Shortness of Breath    History of Present Illness:  Tommy Robbins is a 72 y.o. male with PMH of HTN, COPD, hypothyroidism, chronic hepatitis C, GERD, tobacco abuse who recently presented to the hospital on 10/24/2016 with VF arrest. Post CPR EKG demonstrated anterolateral STEMI.He underwent urgent cardiac catheterization which demonstrated poor LAD target, mid LAD CTO, RCA CTO and OM1 disease. Attempt was made at the proximal mid LAD, however unsuccessful. He required IABP. It wasfelt that there is no target inLAD for CABG. He was started on medical therapy with Plavix. Echocardiogram obtained on the following day showed EF 35-40%, akinesis of mid apical and anterolateral, mid apical anteroseptal, inferoseptal, apical myocardium, grade 1 diastolic dysfunction, PA peak pressure 41 mmHg. Peak troponin was 17.76. IABP was removed on 10/26/2016.  I previously saw the patient between August and October, I consolidated his short acting metoprolol to [place]-acting metoprolol.  He was also on lisinopril and spironolactone for LV dysfunction.  I also placed him on 30 mg daily of Imdur as well.  Repeat echocardiogram obtained on 02/03/2017 showed EF 35-40%, grade 1 DD, inferolateral hypokinesis with mid to apical anteroseptal severe hypokinesis, mild LVH, borderline dilated aortic root and the ascending aorta.  He was referred to Dr. Johney Frame in November for consideration of ICD.  ICD was initially going to be placed in December, however this was delayed it due to moderate left bicipital hematoma.  He ultimately underwent Medtronic dual-chamber ICD placement (Medtronic Evere MRI XT DR SureScan model [id number] ICD) on 03/24/2017.  Chest x-ray showed no complication afterward.  He had  a wound clinic follow-up on 04/05/2017, device interrogation showed normal function, thresholds, sensing and impedance.  Patient presents today for cardiology office visit.  He denies any recent chest pain or palpitation.  He has not had any ICD fire.  He brought his blood pressure diary and heart rate diary.  His weight did increase about 5 pounds since November, however he appears to be euvolemic on physical exam.  He has no lower extremity edema.  He does have crackles in the left base, however I think this is more likely to be atelectasis.  I did give him a prescription for as needed Lasix before however he has not started using this.  He does have the medication at home if he has any increase in swelling or shortness of breath at night.  Otherwise he is quite stable, he can follow-up with Dr. Allyson Sabal in 4-76-month.  As a side note, his niece is graduating PA school in May 2019 and hopefully plan to work in the emergency room.   Past Medical History:  Diagnosis Date  . AICD (automatic cardioverter/defibrillator) present 03/24/2017  . Cataract   . Cellulitis and abscess of right leg 10/06/2016   right thigh  . Chronic low back pain   . Colon polyps   . Depression   . Diverticulosis   . GERD (gastroesophageal reflux disease)   . Hepatitis C   . History of sudden cardiac arrest 10/25/2016   witness/notes 02/10/2017  . Hx of substance abuse    /notes 02/10/2017  . Hypertension   . Insomnia   . Thyroid disease  hypothyroid    Past Surgical History:  Procedure Laterality Date  . CARDIAC CATHETERIZATION    . CARDIAC DEFIBRILLATOR PLACEMENT  03/24/2017  . IABP INSERTION N/A 10/24/2016   Procedure: IABP Insertion;  Surgeon: Runell Gess, MD;  Location: [facility] INVASIVE CV LAB;  Service: Cardiovascular;  Laterality: N/A;  . ICD IMPLANT N/A 03/24/2017   Procedure: ICD IMPLANT;  Surgeon: Hillis Range, MD;  Location: [facility] INVASIVE CV LAB;  Service: Cardiovascular;  Laterality: N/A;  . JOINT  REPLACEMENT    . LEFT HEART CATH AND CORONARY ANGIOGRAPHY N/A 10/24/2016   Procedure: LEFT HEART CATH AND CORONARY ANGIOGRAPHY;  Surgeon: Runell Gess, MD;  Location: [facility] INVASIVE CV LAB;  Service: Cardiovascular;  Laterality: N/A;  . TOTAL SHOULDER REPLACEMENT Right 03/23/2008    Current Medications: Outpatient Medications Prior to Visit  Medication Sig Dispense Refill  . aspirin EC 81 MG tablet Take 81 mg by mouth daily.    Marland Kitchen atorvastatin (LIPITOR) 80 MG tablet Take 1 tablet (80 mg total) by mouth daily at 6 PM. 90 tablet 2  . clopidogrel (PLAVIX) 75 MG tablet Take 1 tablet (75 mg total) by mouth daily. Resume 03/27/17 90 tablet 2  . cycloSPORINE (RESTASIS) 0.05 % ophthalmic emulsion Place 1 drop into both eyes 2 (two) times daily.    . diclofenac sodium (VOLTAREN) 1 % GEL Apply 1 application topically daily as needed (for pain).    Marland Kitchen gabapentin (NEURONTIN) 100 MG capsule Take 100 mg by mouth at bedtime. Take 1 or 2 capsules daily by mouth at bedtime  2  . levothyroxine (SYNTHROID, LEVOTHROID) 88 MCG tablet TAKE 1 TABLET DAILY BEFORE BREAKFAST (Patient taking differently: Take 88 mcg by mouth daily before breakfast) 90 tablet 2  . lidocaine (LIDODERM) 5 % Use up to 3 patches on skin daily as needed for pain  4  . lisinopril (PRINIVIL,ZESTRIL) 20 MG tablet Take 1 tablet (20 mg total) by mouth daily. 90 tablet 2  . morphine (MS CONTIN) 15 MG 12 hr tablet Take 15 mg by mouth See admin instructions. Take 15 mg by mouth in the morning with 30 mg to equal 45 mg dose  0  . morphine (MS CONTIN) 30 MG 12 hr tablet Take 30 mg by mouth See admin instructions. Takes 30 mg by mouth in the morning with 15 mg to equal 45 mg dose, 30 mg in the afternoon, and 30 mg at night    . Nutritional Supplements (JUICE PLUS FIBRE PO) Take 2 capsules by mouth 2 (two) times daily.    . pantoprazole (PROTONIX) 40 MG tablet Take 1 tablet (40 mg total) by mouth daily. 90 tablet 2  . spironolactone (ALDACTONE) 25 MG tablet  Take 1 tablet (25 mg total) by mouth daily. 90 tablet 2  . temazepam (RESTORIL) 15 MG capsule Take 1 capsule (15 mg total) at bedtime as needed by mouth for sleep. (Patient taking differently: Take 15 mg by mouth See admin instructions. Take 15 mg by mouth every other night) 45 capsule 1  . traZODone (DESYREL) 50 MG tablet Take 25 mg by mouth at bedtime.   2  . valACYclovir (VALTREX) 1000 MG tablet Take 1 tablet (1,000 mg total) by mouth 3 (three) times daily. For shingles 21 tablet 0  . furosemide (LASIX) 20 MG tablet Take 1 tablet (20 mg total) by mouth daily as needed. (Patient taking differently: Take 20 mg by mouth daily as needed for fluid. ) 90 tablet 3  . isosorbide mononitrate (  IMDUR) 30 MG 24 hr tablet Take 1 tablet (30 mg total) by mouth daily. 90 tablet 3  . metoprolol succinate (TOPROL-XL) 50 MG 24 hr tablet Take 1 tablet (50 mg total) by mouth daily. Take with or immediately following a meal. 90 tablet 3  . nitroGLYCERIN (NITROSTAT) 0.4 MG SL tablet Place 1 tablet (0.4 mg total) under the tongue every 5 (five) minutes as needed for chest pain. 25 tablet 3   No facility-administered medications prior to visit.      Allergies:   Other   Social History   Socioeconomic History  . Marital status: Divorced    Spouse name: None  . Number of children: None  . Years of education: None  . Highest education level: None  Social Needs  . Financial resource strain: None  . Food insecurity - worry: None  . Food insecurity - inability: None  . Transportation needs - medical: None  . Transportation needs - non-medical: None  Occupational History  . None  Tobacco Use  . Smoking status: Current Some Day Smoker    Years: 53.00    Types: Cigarettes  . Smokeless tobacco: Never Used  Substance and Sexual Activity  . Alcohol use: Yes    Alcohol/week: 3.0 oz    Types: 5 Cans of beer per week  . Drug use: Yes    Types: Marijuana    Comment: h/o IVDA; "no IVDAsince the 1970s; last smoked  pot ~ 10/2016"  . Sexual activity: Not Currently  Other Topics Concern  . None  Social History Narrative   Entered 12/2013:   Lives with his Sister, Niece, and Mother   They take turns caring for mother, who is 55   Quit smoking in 2013     Family History:  The patient's family history includes Arrhythmia in his sister; Arthritis in his father and sister; Arthritis/Rheumatoid in his sister; CAD in his brother; Cancer in his sister; Hypertension in his sister; Osteoarthritis in his mother; Peripheral vascular disease in his sister; Scoliosis in his sister.   ROS:   Please see the history of present illness.    ROS All other systems reviewed and are negative.   PHYSICAL EXAM:   VS:  BP 104/70   Pulse 61   Ht 5\' 10"  (1.778 m)   Wt 171 lb (77.6 kg)   BMI 24.54 kg/m    GEN: Well nourished, well developed, in no acute distress  HEENT: normal  Neck: no JVD, carotid bruits, or masses Cardiac: RRR; no murmurs, rubs, or gallops,no edema  Respiratory:  Left basilar crackles GI: soft, nontender, nondistended, + BS MS: no deformity or atrophy  Skin: warm and dry, no rash Neuro:  Alert and Oriented x 3, Strength and sensation are intact Psych: euthymic mood, full affect  Wt Readings from Last 3 Encounters:  05/13/17 171 lb (77.6 kg)  04/21/17 168 lb (76.2 kg)  04/02/17 168 lb (76.2 kg)      Studies/Labs Reviewed:   EKG:  EKG is not ordered today.    Recent Labs: 10/27/2016: Magnesium 1.8 01/20/2017: ALT 48 02/09/2017: TSH 0.70 03/24/2017: BUN 12; Creatinine, Ser 0.82; Hemoglobin 13.1; Platelets 112; Potassium 4.8; Sodium 136   Lipid Panel    Component Value Date/Time   CHOL 88 (L) 01/20/2017 1014   TRIG 55 01/20/2017 1014   HDL 51 01/20/2017 1014   CHOLHDL 1.7 01/20/2017 1014   CHOLHDL 2.2 10/29/2016 0332   VLDL 13 10/29/2016 0332   LDLCALC 26 01/20/2017  1014    Additional studies/ records that were reviewed today include:   Echo 02/03/2017 LV EF: 35% -   40%  Study  Conclusions  - Left ventricle: The cavity size was normal. Wall thickness was   increased in a pattern of mild LVH. Inferolateral hypokinesis.   Mid to apical anteroseptal severe hypokinesis. Severe peri-apical   hypokinesis. Systolic function was moderately reduced. The   estimated ejection fraction was in the range of 35% to 40%.   Doppler parameters are consistent with abnormal left ventricular   relaxation (grade 1 diastolic dysfunction). - Aortic valve: Trileaflet; moderately calcified leaflets.   Sclerosis without stenosis. - Aorta: Borderline dilated aortic root and ascending aorta. Aortic   root dimension: 37 mm (ED). Ascending aortic diameter: 37 mm (S). - Mitral valve: Mildly calcified annulus. There was trivial   regurgitation. - Left atrium: The atrium was moderately dilated. - Right ventricle: The cavity size was normal. Systolic function   was normal. - Tricuspid valve: Peak RV-RA gradient (S): 25 mm Hg. - Pulmonary arteries: PA peak pressure: 28 mm Hg (S). - Inferior vena cava: The vessel was normal in size. The   respirophasic diameter changes were in the normal range (= 50%),   consistent with normal central venous pressure.  Impressions:  - Normal LV size with mild LV hypertrophy. EF 35-40% with wall   motion abnormalities as noted above. Aortic valve sclerosis   without significant stenosis. Normal RV size and systolic   function.   ASSESSMENT:    1. Coronary artery disease involving native coronary artery of native heart without angina pectoris   2. Essential hypertension   3. Hypothyroidism, unspecified type   4. Chronic hepatitis C without hepatic coma (HCC)   5. Ischemic cardiomyopathy   6. ICD (implantable cardioverter-defibrillator) in place   7. Tobacco abuse      PLAN:  In order of problems listed above:  1. Coronary artery disease: Complicated by V. fib arrest, he had occlusion of mid LAD not amenable to PCI or CABG.  This resulted in  significant scarring.  He has not had any chest pain recently.  Continue aspirin, statin, Plavix and beta-blocker  2. Ischemic cardiomyopathy s/p dual chamber MDT ICD: Managed by Dr. Johney Frame.  Recent interrogation shows normal device function  3. Hypertension: Blood pressure very well controlled on current medication  4. Hypothyroidism: Managed by primary care provider  5. Hyperlipidemia: On 80 mg daily of Lipitor.  Recent lab work shows very well controlled cholesterol  6. Chronic hepatitis C: Followed by Dr. Orvan Falconer, it seems he recently did not wish to pursue any further treatment.    7. Tobacco abuse: He has picked up smoking again, he is interested in quit smoking.  He has tried Chantix before and it worked for him.  I will prescribe Chantix again.    Medication Adjustments/Labs and Tests Ordered: Current medicines are reviewed at length with the patient today.  Concerns regarding medicines are outlined above.  Medication changes, Labs and Tests ordered today are listed in the Patient Instructions below. Patient Instructions  Medication Instructions: Your physician recommends that you continue on your current medications as directed. Please refer to the Current Medication list given to you today.  START Chantix--starter pack and then continuing month pack   Follow-Up: Your physician recommends that you schedule a follow-up appointment in: 4-5 months with Dr. Allyson Sabal.  If you need a refill on your cardiac medications before your next appointment, please call your pharmacy.  Ramond Dial, [place]  05/14/2017 1:36 PM    [facility] [address] [place], [place], [place]  [address] Phone: [phone]; Fax: [phone]

## 2017-05-13 NOTE — Patient Instructions (Signed)
Medication Instructions: Your physician recommends that you continue on your current medications as directed. Please refer to the Current Medication list given to you today.  START Chantix--starter pack and then continuing month pack   Follow-Up: Your physician recommends that you schedule a follow-up appointment in: 4-5 months with Dr. Allyson Sabal.  If you need a refill on your cardiac medications before your next appointment, please call your pharmacy.

## 2017-05-14 ENCOUNTER — Encounter: Payer: Self-pay | Admitting: Physician Assistant

## 2017-06-07 ENCOUNTER — Other Ambulatory Visit: Payer: Self-pay

## 2017-06-07 ENCOUNTER — Encounter: Payer: Self-pay | Admitting: Family Medicine

## 2017-06-07 ENCOUNTER — Ambulatory Visit (INDEPENDENT_AMBULATORY_CARE_PROVIDER_SITE_OTHER): Payer: BC Managed Care – PPO | Admitting: Family Medicine

## 2017-06-07 VITALS — BP 110/62 | HR 56 | Temp 97.5°F | Resp 16 | Ht 70.0 in | Wt 172.0 lb

## 2017-06-07 DIAGNOSIS — Z22322 Carrier or suspected carrier of Methicillin resistant Staphylococcus aureus: Secondary | ICD-10-CM

## 2017-06-07 DIAGNOSIS — L0231 Cutaneous abscess of buttock: Secondary | ICD-10-CM

## 2017-06-07 MED ORDER — DOXYCYCLINE HYCLATE 100 MG PO TABS: 100.0000 mg | ORAL_TABLET | Freq: Two times a day (BID) | ORAL | 0 refills | Status: DC

## 2017-06-07 NOTE — Progress Notes (Signed)
   Subjective:    Patient ID: Tommy Robbins, male    DOB: [birth year], 72 y.o.   MRN: [id number]  Patient presents for Abscess of L Buttocks (x1 week- started out as a small pimple and he popped area- now has grown and become abscess like)    Last week had pimple like lesion on left buttocks somehow it ruptured and now has hard area in left buttocks near crack , no drainage  no fever, no chills   Using dove soap   He took Bactrim DS 1 tablet last night History of MRSA     Review Of Systems:  GEN- denies fatigue, fever, weight loss,weakness, recent illness HEENT- denies eye drainage, change in vision, nasal discharge, CVS- denies chest pain, palpitations RESP- denies SOB, cough, wheeze ABD- denies N/V, change in stools, abd pain GU- denies dysuria, hematuria, dribbling, incontinence MSK- denies joint pain, muscle aches, injury Neuro- denies headache, dizziness, syncope, seizure activity       Objective:    BP 110/62   Pulse (!) 56   Temp (!) 97.5 F (36.4 C) (Oral)   Resp 16   Ht 5\' 10"  (1.778 m)   Wt 172 lb (78 kg)   SpO2 97%   BMI 24.68 kg/m  GEN- NAD, alert and oriented x3 CVS- RRR, no murmur RESP-CTAB Skin- Left buttocks near clear silver dollar size erythema with abscess in center, TTP, black scab at center  Procedure- Incision and Drainage Procedure explained to patient questions answered benefits and risks discussed written consent obtained. 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 Any transcriptional errors that result from this process are unintentional.

## 2017-06-07 NOTE — Patient Instructions (Signed)
F/U Wed for recheck  

## 2017-06-09 ENCOUNTER — Other Ambulatory Visit: Payer: Self-pay

## 2017-06-09 ENCOUNTER — Encounter: Payer: Self-pay | Admitting: Family Medicine

## 2017-06-09 ENCOUNTER — Ambulatory Visit: Payer: BC Managed Care – PPO | Admitting: Physician Assistant

## 2017-06-09 ENCOUNTER — Ambulatory Visit (INDEPENDENT_AMBULATORY_CARE_PROVIDER_SITE_OTHER): Payer: BC Managed Care – PPO | Admitting: Family Medicine

## 2017-06-09 VITALS — BP 116/70 | HR 70 | Temp 98.4°F | Resp 14 | Ht 70.0 in | Wt 171.0 lb

## 2017-06-09 DIAGNOSIS — L0231 Cutaneous abscess of buttock: Secondary | ICD-10-CM

## 2017-06-09 NOTE — Progress Notes (Signed)
   Subjective:    Patient ID: Tommy Robbins, male    DOB: 1945/05/18, 72 y.o.   MRN: [id number]  Patient presents for Follow-up  Here for interim follow-up on abscess of his left buttocks.  He is currently on doxycycline.  He has not had any fever chills.  His culture is still pending there was nothing found on the Gram stain   Review Of Systems:  GEN- denies fatigue, fever, weight loss,weakness, recent illness HEENT- denies eye drainage, change in vision, nasal discharge, CVS- denies chest pain, palpitations RESP- denies SOB, cough, wheeze ABD- denies N/V, change in stools, abd pain GU- denies dysuria, hematuria, dribbling, incontinence MSK- denies joint pain, muscle aches, injury Neuro- denies headache, dizziness, syncope, seizure activity       Objective:    BP 116/70   Pulse 70   Temp 98.4 F (36.9 C) (Oral)   Resp 14   Ht 5\' 10"  (1.778 m)   Wt 171 lb (77.6 kg)   SpO2 96%   BMI 24.54 kg/m  GEN- NAD, alert and oriented x3 Skin- nickle size abscess, no surrounding erythema, I and D site, mild pus draining, minimal TTP         Assessment & Plan:      Problem List Items Addressed This Visit    None    Visit Diagnoses    Abscess of left buttock    -  Primary   Much improved abscess, complete doxycycline, continue bandage chanegs, F/U monday for recheck, continue antibacterial, Staph on Culture      Note: This dictation was prepared with Dragon dictation along with smaller phrase technology. Any transcriptional errors that result from this process are unintentional.

## 2017-06-09 NOTE — Patient Instructions (Signed)
F/U Monday

## 2017-06-10 ENCOUNTER — Other Ambulatory Visit: Payer: Self-pay | Admitting: *Deleted

## 2017-06-10 ENCOUNTER — Telehealth: Payer: Self-pay | Admitting: Physician Assistant

## 2017-06-10 LAB — WOUND CULTURE
MICRO NUMBER: 90338482
SPECIMEN QUALITY: ADEQUATE

## 2017-06-10 MED ORDER — MUPIROCIN 2 % EX OINT: 1.0000 "application " | TOPICAL_OINTMENT | Freq: Two times a day (BID) | CUTANEOUS | 0 refills | Status: DC

## 2017-06-11 ENCOUNTER — Encounter: Payer: Self-pay | Admitting: Internal Medicine

## 2017-06-14 ENCOUNTER — Other Ambulatory Visit: Payer: Self-pay

## 2017-06-14 ENCOUNTER — Ambulatory Visit (INDEPENDENT_AMBULATORY_CARE_PROVIDER_SITE_OTHER): Payer: BC Managed Care – PPO | Admitting: Family Medicine

## 2017-06-14 ENCOUNTER — Encounter: Payer: Self-pay | Admitting: Family Medicine

## 2017-06-14 VITALS — BP 112/62 | HR 82 | Temp 97.8°F | Resp 14 | Ht 70.0 in | Wt 171.0 lb

## 2017-06-14 DIAGNOSIS — A4902 Methicillin resistant Staphylococcus aureus infection, unspecified site: Secondary | ICD-10-CM

## 2017-06-14 DIAGNOSIS — L0231 Cutaneous abscess of buttock: Secondary | ICD-10-CM

## 2017-06-14 MED ORDER — VARENICLINE TARTRATE 1 MG PO TABS: 1.0000 mg | ORAL_TABLET | Freq: Two times a day (BID) | ORAL | 1 refills | Status: DC

## 2017-06-14 NOTE — Patient Instructions (Addendum)
Complete antibiotics Complete the bactroban F/U physical in 2-1months

## 2017-06-14 NOTE — Progress Notes (Signed)
   Subjective:    Patient ID: Tommy Robbins, male    DOB: 1945-06-12, 72 y.o.   MRN: [id number]  Patient presents for Follow-up (abscess- pt reports almost healed- has x3 days of ABTx left)  Pt here to f/u MRSA abscess left buttocks. No concerns, states it is almost healed, no drainage, has 3 days of antibiotics left , no fever, no ain   Dr. Tawanna Solo pain physician, on MS contin 45mg  in Mg, 30mg  BID  Note he has also decided not to pursue treatment for chronic hep C at this time  Review Of Systems:  GEN- denies fatigue, fever, weight loss,weakness, recent illness        Objective:    BP 112/62   Pulse 82   Temp 97.8 F (36.6 C) (Oral)   Resp 14   Ht 5\' 10"  (1.778 m)   Wt 171 lb (77.6 kg)   SpO2 96%   BMI 24.54 kg/m  GEN- NAD, alert and oriented x3 Skin- left buttocks, area of I and D, no fluctuance, no drainage, I & D incision almost closed, NT, no cellulitis        Assessment & Plan:      Problem List Items Addressed This Visit    None    Visit Diagnoses    Abscess of left buttock    -  Primary   Much improved, abscess has resolved, now has I&D site opening only. MRSA infection, complete bactroban, Complete antibiotics, continue anti-bacterial   MRSA infection          Note: This dictation was prepared with Dragon dictation along with smaller phrase technology. Any transcriptional errors that result from this process are unintentional.

## 2017-06-23 ENCOUNTER — Ambulatory Visit (INDEPENDENT_AMBULATORY_CARE_PROVIDER_SITE_OTHER): Payer: BC Managed Care – PPO | Admitting: Internal Medicine

## 2017-06-23 ENCOUNTER — Encounter: Payer: Self-pay | Admitting: Internal Medicine

## 2017-06-23 VITALS — BP 92/57 | HR 62 | Ht 70.0 in | Wt 168.0 lb

## 2017-06-23 DIAGNOSIS — I5022 Chronic systolic (congestive) heart failure: Secondary | ICD-10-CM | POA: Diagnosis not present

## 2017-06-23 DIAGNOSIS — I4901 Ventricular fibrillation: Secondary | ICD-10-CM | POA: Diagnosis not present

## 2017-06-23 DIAGNOSIS — Z95810 Presence of automatic (implantable) cardiac defibrillator: Secondary | ICD-10-CM

## 2017-06-23 DIAGNOSIS — I255 Ischemic cardiomyopathy: Secondary | ICD-10-CM

## 2017-06-23 NOTE — Progress Notes (Signed)
PCP: Dorena Bodo, PA-C Primary Cardiologist:  Dr Allyson Sabal Primary EP: Dr Bosie Helper Tommy Robbins is a 72 y.o. male who presents today for routine electrophysiology followup.  Since his recent ICD implant the patient reports doing very well.  Today, he denies symptoms of palpitations, chest pain, shortness of breath,  lower extremity edema, dizziness, presyncope, syncope, or ICD shocks.  The patient is otherwise without complaint today.   Past Medical History:  Diagnosis Date  . AICD (automatic cardioverter/defibrillator) present 03/24/2017  . Cataract   . Cellulitis and abscess of right leg 10/06/2016   right thigh  . Chronic low back pain   . Colon polyps   . Depression   . Diverticulosis   . GERD (gastroesophageal reflux disease)   . Hepatitis C   . History of sudden cardiac arrest 10/25/2016   witness/notes 02/10/2017  . Hx of substance abuse    /notes 02/10/2017  . Hypertension   . Insomnia   . Thyroid disease    hypothyroid   Past Surgical History:  Procedure Laterality Date  . CARDIAC CATHETERIZATION    . CARDIAC DEFIBRILLATOR PLACEMENT  03/24/2017  . IABP INSERTION N/A 10/24/2016   Procedure: IABP Insertion;  Surgeon: Runell Gess, MD;  Location: [facility] INVASIVE CV LAB;  Service: Cardiovascular;  Laterality: N/A;  . ICD IMPLANT N/A 03/24/2017   Procedure: ICD IMPLANT;  Surgeon: Hillis Range, MD;  Location: [facility] INVASIVE CV LAB;  Service: Cardiovascular;  Laterality: N/A;  . JOINT REPLACEMENT    . LEFT HEART CATH AND CORONARY ANGIOGRAPHY N/A 10/24/2016   Procedure: LEFT HEART CATH AND CORONARY ANGIOGRAPHY;  Surgeon: Runell Gess, MD;  Location: [facility] INVASIVE CV LAB;  Service: Cardiovascular;  Laterality: N/A;  . TOTAL SHOULDER REPLACEMENT Right 03/23/2008    ROS- all systems are reviewed and negative except as per HPI above  Current Outpatient Medications  Medication Sig Dispense Refill  . aspirin EC 81 MG tablet Take 81 mg by mouth daily.    Marland Kitchen atorvastatin  (LIPITOR) 80 MG tablet Take 1 tablet (80 mg total) by mouth daily at 6 PM. 90 tablet 2  . clopidogrel (PLAVIX) 75 MG tablet Take 1 tablet (75 mg total) by mouth daily. Resume 03/27/17 90 tablet 2  . cycloSPORINE (RESTASIS) 0.05 % ophthalmic emulsion Place 1 drop into both eyes 2 (two) times daily.    . diclofenac sodium (VOLTAREN) 1 % GEL Apply 1 application topically daily as needed (for pain).    Marland Kitchen gabapentin (NEURONTIN) 100 MG capsule Take 200 mg by mouth at bedtime.   2  . isosorbide mononitrate (IMDUR) 30 MG 24 hr tablet Take 1 tablet (30 mg total) by mouth daily. 90 tablet 3  . levothyroxine (SYNTHROID, LEVOTHROID) 88 MCG tablet TAKE 1 TABLET DAILY BEFORE BREAKFAST (Patient taking differently: Take 88 mcg by mouth daily before breakfast) 90 tablet 2  . lidocaine (LIDODERM) 5 % Use up to 3 patches on skin daily as needed for pain  4  . lisinopril (PRINIVIL,ZESTRIL) 20 MG tablet Take 1 tablet (20 mg total) by mouth daily. 90 tablet 2  . metoprolol succinate (TOPROL-XL) 50 MG 24 hr tablet Take 1 tablet (50 mg total) by mouth daily. Take with or immediately following a meal. 90 tablet 3  . morphine (MS CONTIN) 15 MG 12 hr tablet Take 15 mg by mouth See admin instructions. Take 15 mg by mouth in the morning with 30 mg to equal 45 mg dose  0  .  morphine (MS CONTIN) 30 MG 12 hr tablet Take 30 mg by mouth See admin instructions. Takes 30 mg by mouth in the morning with 15 mg to equal 45 mg dose, 30 mg in the afternoon, and 30 mg at night    . nitroGLYCERIN (NITROSTAT) 0.4 MG SL tablet Place 1 tablet (0.4 mg total) under the tongue every 5 (five) minutes as needed for chest pain. 25 tablet 3  . Nutritional Supplements (JUICE PLUS FIBRE PO) Take 2 capsules by mouth 2 (two) times daily.    . pantoprazole (PROTONIX) 40 MG tablet Take 1 tablet (40 mg total) by mouth daily. 90 tablet 2  . spironolactone (ALDACTONE) 25 MG tablet Take 1 tablet (25 mg total) by mouth daily. 90 tablet 2  . temazepam (RESTORIL) 15  MG capsule Take 1 capsule (15 mg total) at bedtime as needed by mouth for sleep. (Patient taking differently: Take 15 mg by mouth See admin instructions. Take 15 mg by mouth every other night) 45 capsule 1  . traZODone (DESYREL) 50 MG tablet Take 25 mg by mouth at bedtime.   2  . varenicline (CHANTIX CONTINUING MONTH PAK) 1 MG tablet Take 1 tablet (1 mg total) by mouth 2 (two) times daily. 60 tablet 1  . furosemide (LASIX) 20 MG tablet Take 1 tablet (20 mg total) by mouth daily as needed. (Patient taking differently: Take 20 mg by mouth daily as needed for fluid. ) 90 tablet 3   No current facility-administered medications for this visit.     Physical Exam: Vitals:   06/23/17 1210  BP: (!) 92/57  Pulse: 62  Weight: 76.2 kg (168 lb)  Height: 5\' 10"  (1.778 m)    GEN- The patient is well appearing, alert and oriented x 3 today.   Head- normocephalic, atraumatic Eyes-  Sclera clear, conjunctiva pink Ears- hearing intact Oropharynx- clear Lungs- Clear to ausculation bilaterally, normal work of breathing Chest- ICD pocket is well healed Heart- Regular rate and rhythm, no murmurs, rubs or gallops, PMI not laterally displaced GI- soft, NT, ND, + BS Extremities- no clubbing, cyanosis, or edema  ICD interrogation- reviewed in detail today,  See PACEART report   Assessment and Plan:  1.  Chronic systolic dysfunction/ ischemic CM/ CAD/ prior VF arrest euvolemic today No ischemic symptoms Stable on an appropriate medical regimen Normal ICD function See Pace Art report No changes today Will enroll in ICM device clinic with Randon Goldsmith  2. HTN Stable No change required today  3. Tobacco He has quit!  [facility]link Return to see EP NP in a year Follow-up with Dr Allyson Sabal as scheduled  Hillis Range MD, [facility] 06/23/2017 12:19 PM

## 2017-06-23 NOTE — Patient Instructions (Addendum)
Medication Instructions:  Your physician recommends that you continue on your current medications as directed. Please refer to the Current Medication list given to you today.  Labwork: None ordered.  Testing/Procedures: None ordered.  Follow-Up: Your physician wants you to follow-up in: one year with Gypsy Balsam, NP.   You will receive a reminder letter in the mail two months in advance. If you don't receive a letter, please call our office to schedule the follow-up appointment.  Remote monitoring is used to monitor your ICD from home. This monitoring reduces the number of office visits required to check your device to one time per year. It allows [place] to keep an eye on the functioning of your device to ensure it is working properly. You are scheduled for a device check from home on 09/22/2017. You may send your transmission at any time that day. If you have a wireless device, the transmission will be sent automatically. After your physician reviews your transmission, you will receive a postcard with your next transmission date.  Any Other Special Instructions Will Be Listed Below (If Applicable).  You will be enrolled in [facility] clinic with Randon Goldsmith  If you need a refill on your cardiac medications before your next appointment, please call your pharmacy.

## 2017-07-02 ENCOUNTER — Other Ambulatory Visit: Payer: Self-pay | Admitting: Family Medicine

## 2017-07-02 DIAGNOSIS — G479 Sleep disorder, unspecified: Secondary | ICD-10-CM

## 2017-07-02 MED ORDER — TEMAZEPAM 15 MG PO CAPS: 15.0000 mg | ORAL_CAPSULE | Freq: Every evening | ORAL | 1 refills | Status: DC | PRN

## 2017-07-02 NOTE — Telephone Encounter (Signed)
Ok to refill??  Last office visit 06/14/2017.  Last refill 02/05/2017, #1 refill.

## 2017-07-08 LAB — CUP PACEART INCLINIC DEVICE CHECK
Battery Remaining Longevity: 131 mo
Battery Voltage: 3.08 V
Brady Statistic AS VP Percent: 0.04 %
Brady Statistic RA Percent Paced: 5.87 %
Brady Statistic RV Percent Paced: 0.05 %
Date Time Interrogation Session: 20190403161706
HighPow Impedance: 68 Ohm
Implantable Lead Implant Date: 20190102
Implantable Lead Implant Date: 20190102
Implantable Lead Location: 753859
Implantable Lead Model: 5076
Lead Channel Impedance Value: 323 Ohm
Lead Channel Impedance Value: 380 Ohm
Lead Channel Impedance Value: 494 Ohm
Lead Channel Pacing Threshold Amplitude: 0.5 V
Lead Channel Pacing Threshold Pulse Width: 0.4 ms
Lead Channel Pacing Threshold Pulse Width: 0.4 ms
Lead Channel Sensing Intrinsic Amplitude: 12.25 mV
Lead Channel Sensing Intrinsic Amplitude: 3.375 mV
Lead Channel Setting Pacing Amplitude: 2.5 V
Lead Channel Setting Sensing Sensitivity: 0.3 mV
MDC IDC LEAD LOCATION: 753860
MDC IDC MSMT LEADCHNL RV PACING THRESHOLD AMPLITUDE: 1 V
MDC IDC PG IMPLANT DT: 20190102
MDC IDC SET LEADCHNL RA PACING AMPLITUDE: 2 V
MDC IDC SET LEADCHNL RV PACING PULSEWIDTH: 0.4 ms
MDC IDC STAT BRADY AP VP PERCENT: 0.01 %
MDC IDC STAT BRADY AP VS PERCENT: 5.92 %
MDC IDC STAT BRADY AS VS PERCENT: 94.03 %

## 2017-07-19 ENCOUNTER — Other Ambulatory Visit: Payer: Self-pay | Admitting: *Deleted

## 2017-07-19 MED ORDER — LEVOTHYROXINE SODIUM 88 MCG PO TABS: 88.0000 ug | ORAL_TABLET | Freq: Every day | ORAL | 2 refills | Status: DC

## 2017-07-30 ENCOUNTER — Telehealth: Payer: Self-pay

## 2017-07-30 NOTE — Telephone Encounter (Signed)
Referral to [facility] clinic from Dr Johney Frame.  Attempted ICM intro call and left message with direct number to return call.

## 2017-08-04 ENCOUNTER — Telehealth: Payer: Self-pay | Admitting: Physician Assistant

## 2017-08-04 NOTE — Telephone Encounter (Signed)
Pt recently seen dr. Jeanice Lim for abscess on buttocks, now has one on top of his head, wants to know if we can call in more antibiotic for him or if he needs to come in and have it looked at. I offered an appointment but pt doesn't want to come in unless he absolutely has to. Pharmacy is [facility] [address]

## 2017-08-04 NOTE — Telephone Encounter (Signed)
NTBS.   Appointment scheduled.

## 2017-08-05 ENCOUNTER — Other Ambulatory Visit: Payer: Self-pay

## 2017-08-05 ENCOUNTER — Encounter: Payer: Self-pay | Admitting: Family Medicine

## 2017-08-05 ENCOUNTER — Ambulatory Visit (INDEPENDENT_AMBULATORY_CARE_PROVIDER_SITE_OTHER): Payer: BC Managed Care – PPO | Admitting: Family Medicine

## 2017-08-05 VITALS — BP 118/64 | HR 82 | Temp 98.6°F | Resp 14 | Ht 70.0 in | Wt 169.0 lb

## 2017-08-05 DIAGNOSIS — L739 Follicular disorder, unspecified: Secondary | ICD-10-CM

## 2017-08-05 MED ORDER — MUPIROCIN 2 % EX OINT: 1.0000 "application " | TOPICAL_OINTMENT | Freq: Two times a day (BID) | CUTANEOUS | 0 refills | Status: AC

## 2017-08-05 NOTE — Progress Notes (Signed)
mup    Patient ID: Tommy Robbins, male    DOB: 12-22-1945, 72 y.o.   MRN: [id number]  PCP: Dorena Bodo, PA-C  Chief Complaint  Patient presents with  . Possible Abscess to Head    x1 week- hard knot to back of head on L side    Subjective:   Tommy Robbins is a 72 y.o. male, presents to clinic with CC of itchy swollen bump to the back of his scalp on the left side.  It feels hard and has some scabbing on top.  He denies any drainage.  It does seem to have gotten smaller in size.  He presents because of concern with recent and recurrent abscesses, history of MRSA.  Last abscesses were on his buttock, required I&D and antibiotics.  He does use antibiotic body soap.  He does not have any other areas of swelling, nodules or infection.  Bump to the back of his head is not painful.     Patient Active Problem List   Diagnosis Date Noted  . Ischemic cardiomyopathy 03/24/2017  . CAD (coronary artery disease), native coronary artery 11/06/2016  . Systolic CHF (HCC) 11/06/2016  . Acute ST elevation myocardial infarction (STEMI) involving left anterior descending (LAD) coronary artery (HCC) 10/25/2016  . Actinic keratosis 07/25/2015  . Chronic low back pain   . COPD (chronic obstructive pulmonary disease) (HCC) 09/11/2012  . Hepatitis C 09/11/2012  . GERD (gastroesophageal reflux disease) 09/11/2012  . Insomnia 09/11/2012  . Hypothyroidism 09/20/2006  . HYPERTENSION, BENIGN ESSENTIAL 09/20/2006  . ECHOCARDIOGRAM, HX OF 09/20/2006     Prior to Admission medications   Medication Sig Start Date End Date Taking? Authorizing Provider  aspirin EC 81 MG tablet Take 81 mg by mouth daily.   Yes [provider]  atorvastatin (LIPITOR) 80 MG tablet Take 1 tablet (80 mg total) by mouth daily at 6 PM. 11/06/16  Yes [place], Velna Hatchet, MD  clopidogrel (PLAVIX) 75 MG tablet Take 1 tablet (75 mg total) by mouth daily. Resume 03/27/17 03/25/17  Yes [place], Amber K, NP  clopidogrel (PLAVIX) 75 MG  tablet TAKE 1 TABLET DAILY 07/02/17  Yes [place], Velna Hatchet, MD  cycloSPORINE (RESTASIS) 0.05 % ophthalmic emulsion Place 1 drop into both eyes 2 (two) times daily.   Yes [provider]  diclofenac sodium (VOLTAREN) 1 % GEL Apply 1 application topically daily as needed (for pain).   Yes [provider]  gabapentin (NEURONTIN) 100 MG capsule Take 200 mg by mouth at bedtime.  03/03/17  Yes [provider]  isosorbide mononitrate (IMDUR) 30 MG 24 hr tablet Take 1 tablet (30 mg total) by mouth daily. 01/20/17  Yes Azalee Course, PA  levothyroxine (SYNTHROID, LEVOTHROID) 88 MCG tablet Take 1 tablet (88 mcg total) by mouth daily before breakfast. 07/19/17  Yes [place], Velna Hatchet, MD  lidocaine (LIDODERM) 5 % Use up to 3 patches on skin daily as needed for pain 10/17/14  Yes [provider]  lisinopril (PRINIVIL,ZESTRIL) 20 MG tablet TAKE 1 TABLET DAILY 07/02/17  Yes [place], Velna Hatchet, MD  metoprolol succinate (TOPROL-XL) 50 MG 24 hr tablet Take 1 tablet (50 mg total) by mouth daily. Take with or immediately following a meal. 11/30/16  Yes [place], [address], PA  morphine (MS CONTIN) 15 MG 12 hr tablet Take 15 mg by mouth See admin instructions. Take 15 mg by mouth in the morning with 30 mg to equal 45 mg dose 06/30/15  Yes [provider]  morphine (MS CONTIN) 30 MG 12 hr tablet Take 30 mg by mouth See admin instructions. Takes 30 mg by mouth in the morning with 15 mg to equal 45 mg dose, 30 mg in the afternoon, and 30 mg at night   Yes [provider]  nitroGLYCERIN (NITROSTAT) 0.4 MG SL tablet Place 1 tablet (0.4 mg total) under the tongue every 5 (five) minutes as needed for chest pain. 01/20/17  Yes Azalee Course, PA  Nutritional Supplements (JUICE PLUS FIBRE PO) Take 2 capsules by mouth 2 (two) times daily.   Yes [provider]  pantoprazole (PROTONIX) 40 MG tablet Take 1 tablet (40 mg total) by mouth daily. 11/06/16  Yes [place], Velna Hatchet, MD  spironolactone  (ALDACTONE) 25 MG tablet TAKE 1 TABLET DAILY 07/02/17  Yes [place], Velna Hatchet, MD  temazepam (RESTORIL) 15 MG capsule Take 1 capsule (15 mg total) by mouth at bedtime as needed for sleep. 07/02/17  Yes [place], Velna Hatchet, MD  traZODone (DESYREL) 50 MG tablet Take 25 mg by mouth at bedtime.  09/02/16  Yes [provider]  varenicline (CHANTIX CONTINUING MONTH PAK) 1 MG tablet Take 1 tablet (1 mg total) by mouth 2 (two) times daily. 06/14/17  Yes Azalee Course, PA  furosemide (LASIX) 20 MG tablet Take 1 tablet (20 mg total) by mouth daily as needed. Patient taking differently: Take 20 mg by mouth daily as needed for fluid.  01/20/17 04/20/17  Azalee Course, PA  mupirocin ointment (BACTROBAN) 2 % Apply 1 application topically 2 (two) times daily for 7 days. To affected area of skin 08/05/17 08/12/17  Danelle Berry, PA-C     Allergies  Allergen Reactions  . Other Other (See Comments)    Seasonal allergies - sniffling      Family History  Problem Relation Age of Onset  . Arthritis Father        rheumatoid  . Hypertension Sister   . Cancer Sister   . Scoliosis Sister   . Arthritis Sister   . Osteoarthritis Mother   . Arthritis/Rheumatoid Sister   . Peripheral vascular disease Sister   . Arrhythmia Sister   . CAD Brother      Social History   Socioeconomic History  . Marital status: Divorced    Spouse name: Not on file  . Number of children: Not on file  . Years of education: Not on file  . Highest education level: Not on file  Occupational History  . Not on file  Social Needs  . Financial resource strain: Not on file  . Food insecurity:    Worry: Not on file    Inability: Not on file  . Transportation needs:    Medical: Not on file    Non-medical: Not on file  Tobacco Use  . Smoking status: Current Some Day Smoker    Years: 53.00    Types: Cigarettes  . Smokeless tobacco: Never Used  Substance and Sexual Activity  . Alcohol use: Yes    Alcohol/week: 3.0 oz    Types: 5 Cans  of beer per week  . Drug use: Yes    Types: Marijuana    Comment: h/o IVDA; "no IVDAsince the 1970s; last smoked pot ~ 10/2016"  . Sexual activity: Not Currently  Lifestyle  . Physical activity:    Days per week: Not on file    Minutes per session: Not on file  . Stress: Not on file  Relationships  . Social connections:    Talks  on phone: Not on file    Gets together: Not on file    Attends religious service: Not on file    Active member of club or organization: Not on file    Attends meetings of clubs or organizations: Not on file    Relationship status: Not on file  . Intimate partner violence:    Fear of current or ex partner: Not on file    Emotionally abused: Not on file    Physically abused: Not on file    Forced sexual activity: Not on file  Other Topics Concern  . Not on file  Social History Narrative   Entered 12/2013:   Lives with his Sister, Niece, and Mother   They take turns caring for mother, who is 28   Quit smoking in 2013     Review of Systems  All other systems reviewed and are negative.      Objective:    Vitals:   08/05/17 0850  BP: 118/64  Pulse: 82  Resp: 14  Temp: 98.6 F (37 C)  TempSrc: Oral  SpO2: 99%  Weight: 169 lb (76.7 kg)  Height: 5\' 10"  (1.778 m)      Physical Exam  Constitutional: He appears well-developed.  HENT:  Head: Normocephalic and atraumatic.  Nose: Nose normal.  Eyes: Conjunctivae are normal. Right eye exhibits no discharge. Left eye exhibits no discharge.  Neck: No tracheal deviation present.  Cardiovascular: Normal rate and regular rhythm.  Pulmonary/Chest: Effort normal. No stridor. No respiratory distress.  Musculoskeletal: Normal range of motion.  Neurological: He is alert. He exhibits normal muscle tone. Coordination normal.  Skin: Skin is warm and dry. Rash noted. Rash is nodular. Rash is not pustular.  Small oval nodule to left posterior edge of hairline, about 1.5 cm x 1 cm in area, central scab with  hair follicle, firm, no fluctuance, nontender, no surrounding edema, erythema or induration.  Psychiatric: He has a normal mood and affect. His behavior is normal.  Nursing note and vitals reviewed.         Assessment & Plan:      ICD-10-CM   1. Folliculitis L73.9     Small papule with excoriated central scabbing, located in left lower edge of his scalp, it is pruritic, may have started with a insect bite, it is in his hair appears mildly edematous around it but less than half a centimeter around the scabbed area.  He nontender, nonfluctuant.  Encouraged to treat with over-the-counter mupirocin and warm soaks, limit scratching and itching.  Concern for abscess at this time, nothing drainable, no concern for worsening infection, no indication for oral antibiotics   Danelle Berry, PA-C 08/05/17 9:12 AM

## 2017-08-06 NOTE — Telephone Encounter (Signed)
ICM call to patient and intro provided.  He agreed to monthly ICM follow up and 1st ICM remote transmission is 08/12/2017.  Provided ICM number and encouraged to call for fluid symptoms.  He has not had any problems with fluid symptoms.

## 2017-08-09 ENCOUNTER — Telehealth: Payer: Self-pay | Admitting: Physician Assistant

## 2017-08-09 NOTE — Telephone Encounter (Signed)
Patient calling to say that he was in the other day to see leisa with a sore on his head, now has additional sores would like recommendations as to what he should do  [phone]

## 2017-08-11 NOTE — Telephone Encounter (Signed)
Patient called back and stated that he is in need of and antibiotic. He stated that he was told we could call him in a and antibiotic if cream did not work. Please advise?

## 2017-08-12 ENCOUNTER — Ambulatory Visit (INDEPENDENT_AMBULATORY_CARE_PROVIDER_SITE_OTHER): Payer: BC Managed Care – PPO | Admitting: Family Medicine

## 2017-08-12 ENCOUNTER — Other Ambulatory Visit: Payer: Self-pay

## 2017-08-12 ENCOUNTER — Telehealth: Payer: Self-pay | Admitting: Cardiology

## 2017-08-12 ENCOUNTER — Ambulatory Visit (INDEPENDENT_AMBULATORY_CARE_PROVIDER_SITE_OTHER): Payer: BC Managed Care – PPO

## 2017-08-12 VITALS — BP 118/70 | HR 54 | Temp 97.8°F | Wt 169.4 lb

## 2017-08-12 DIAGNOSIS — I5022 Chronic systolic (congestive) heart failure: Secondary | ICD-10-CM | POA: Diagnosis not present

## 2017-08-12 DIAGNOSIS — L03811 Cellulitis of head [any part, except face]: Secondary | ICD-10-CM

## 2017-08-12 DIAGNOSIS — L0211 Cutaneous abscess of neck: Secondary | ICD-10-CM

## 2017-08-12 DIAGNOSIS — Z95810 Presence of automatic (implantable) cardiac defibrillator: Secondary | ICD-10-CM | POA: Diagnosis not present

## 2017-08-12 MED ORDER — DOXYCYCLINE HYCLATE 100 MG PO TABS: 100.0000 mg | ORAL_TABLET | Freq: Two times a day (BID) | ORAL | 0 refills | Status: DC

## 2017-08-12 NOTE — Telephone Encounter (Signed)
Spoke with pt and reminded pt of remote transmission that is due today. Pt verbalized understanding.   

## 2017-08-12 NOTE — Telephone Encounter (Signed)
He should come for another appointment.  The one sore he had at the time of our visit looked like a healing sore around a hair follicle.  Other skin infections in the past were abscesses that needed drainage or antibiotics.  We need to see them in office to determine the necessary dx and tx.

## 2017-08-12 NOTE — Progress Notes (Signed)
Patient ID: Tommy Robbins, male    DOB: 1945/06/24, 72 y.o.   MRN: [id number]  PCP: Dorena Bodo, PA-C  Chief Complaint  Patient presents with  . Folliculitis    Patient in today for folliculitis     Subjective:   Tommy Robbins is a 72 y.o. male, presents to clinic with CC of reported worsening bump to left scalp that did not improve with topical antibiotic.  I did see him 1 week ago for his initial complaint, appeared to be a small less than 1 cm area of swelling with a central hair follicle, that was nontender on exam, did not appear to be an abscess, his symptoms had started with a small bump being extremely itchy.  Infection is very superficial and felt that topical mupirocin would be adequate.  He did begin calling the office earlier this week, while I was not in clinic, stating that it was worsening however I did not receive this message until today and he was instructed to come in for reevaluation.  He has worsening, spreading area of swelling with redness and crusting to the left posterior edge of his scalp behind his ear.  Last week he had not tenderness, now he does have constant pain, it is sore, moderate, exacerbated with palpation and with any pressure.  Has had no drainage from this that he knows of.  He also has a new right abscess to his right posterior neck, tender, no spontaneous drainage, that is also tender.  He reports associated generalized malaise and feels slightly tired but he denies any fever, myalgias, sweats, nausea, vomiting, headaches, weakness.   HPI 08/05/17: Tommy Robbins is a 72 y.o. male, presents to clinic with CC of itchy swollen bump to the back of his scalp on the left side.  It feels hard and has some scabbing on top.  He denies any drainage.  It does seem to have gotten smaller in size.  He presents because of concern with recent and recurrent abscesses, history of MRSA.  Last abscesses were on his buttock, required I&D and antibiotics.  He does use  antibiotic body soap.  He does not have any other areas of swelling, nodules or infection.  Bump to the back of his head is not painful.     Patient Active Problem List   Diagnosis Date Noted  . Ischemic cardiomyopathy 03/24/2017  . CAD (coronary artery disease), native coronary artery 11/06/2016  . Systolic CHF (HCC) 11/06/2016  . Acute ST elevation myocardial infarction (STEMI) involving left anterior descending (LAD) coronary artery (HCC) 10/25/2016  . Actinic keratosis 07/25/2015  . Chronic low back pain   . COPD (chronic obstructive pulmonary disease) (HCC) 09/11/2012  . Hepatitis C 09/11/2012  . GERD (gastroesophageal reflux disease) 09/11/2012  . Insomnia 09/11/2012  . Hypothyroidism 09/20/2006  . HYPERTENSION, BENIGN ESSENTIAL 09/20/2006  . ECHOCARDIOGRAM, HX OF 09/20/2006     Prior to Admission medications   Medication Sig Start Date End Date Taking? Authorizing Provider  aspirin EC 81 MG tablet Take 81 mg by mouth daily.   Yes [provider]  atorvastatin (LIPITOR) 80 MG tablet Take 1 tablet (80 mg total) by mouth daily at 6 PM. 11/06/16  Yes [place], Velna Hatchet, MD  clopidogrel (PLAVIX) 75 MG tablet Take 1 tablet (75 mg total) by mouth daily. Resume 03/27/17 03/25/17  Yes [place], Amber K, NP  cycloSPORINE (RESTASIS) 0.05 % ophthalmic emulsion Place 1 drop into both eyes 2 (two) times  daily.   Yes [provider]  diclofenac sodium (VOLTAREN) 1 % GEL Apply 1 application topically daily as needed (for pain).   Yes [provider]  gabapentin (NEURONTIN) 100 MG capsule Take 200 mg by mouth at bedtime.  03/03/17  Yes [provider]  isosorbide mononitrate (IMDUR) 30 MG 24 hr tablet Take 1 tablet (30 mg total) by mouth daily. 01/20/17  Yes Azalee Course, PA  levothyroxine (SYNTHROID, LEVOTHROID) 88 MCG tablet Take 1 tablet (88 mcg total) by mouth daily before breakfast. 07/19/17  Yes [place], Velna Hatchet, MD  lidocaine (LIDODERM) 5 % Use up to 3 patches on  skin daily as needed for pain 10/17/14  Yes [provider]  lisinopril (PRINIVIL,ZESTRIL) 20 MG tablet TAKE 1 TABLET DAILY 07/02/17  Yes [place], Velna Hatchet, MD  metoprolol succinate (TOPROL-XL) 50 MG 24 hr tablet Take 1 tablet (50 mg total) by mouth daily. Take with or immediately following a meal. 11/30/16  Yes Meng, [place], PA  morphine (MS CONTIN) 15 MG 12 hr tablet Take 15 mg by mouth See admin instructions. Take 15 mg by mouth in the morning with 30 mg to equal 45 mg dose 06/30/15  Yes [provider]  morphine (MS CONTIN) 30 MG 12 hr tablet Take 30 mg by mouth See admin instructions. Takes 30 mg by mouth in the morning with 15 mg to equal 45 mg dose, 30 mg in the afternoon, and 30 mg at night   Yes [provider]  mupirocin ointment (BACTROBAN) 2 % Apply 1 application topically 2 (two) times daily for 7 days. To affected area of skin 08/05/17 08/12/17 Yes Danelle Berry, PA-C  Nutritional Supplements (JUICE PLUS FIBRE PO) Take 2 capsules by mouth 2 (two) times daily.   Yes [provider]  pantoprazole (PROTONIX) 40 MG tablet Take 1 tablet (40 mg total) by mouth daily. 11/06/16  Yes [place], Velna Hatchet, MD  spironolactone (ALDACTONE) 25 MG tablet TAKE 1 TABLET DAILY 07/02/17  Yes [place], Velna Hatchet, MD  temazepam (RESTORIL) 15 MG capsule Take 1 capsule (15 mg total) by mouth at bedtime as needed for sleep. 07/02/17  Yes [place], Velna Hatchet, MD  traZODone (DESYREL) 50 MG tablet Take 25 mg by mouth at bedtime.  09/02/16  Yes [provider]  varenicline (CHANTIX CONTINUING MONTH PAK) 1 MG tablet Take 1 tablet (1 mg total) by mouth 2 (two) times daily. 06/14/17  Yes Azalee Course, PA  doxycycline (VIBRA-TABS) 100 MG tablet Take 1 tablet (100 mg total) by mouth 2 (two) times daily. 08/12/17   Danelle Berry, PA-C  furosemide (LASIX) 20 MG tablet Take 1 tablet (20 mg total) by mouth daily as needed. Patient taking differently: Take 20 mg by mouth daily as needed for fluid.  01/20/17  04/20/17  Azalee Course, PA  nitroGLYCERIN (NITROSTAT) 0.4 MG SL tablet Place 1 tablet (0.4 mg total) under the tongue every 5 (five) minutes as needed for chest pain. Patient not taking: Reported on 08/12/2017 01/20/17   Azalee Course, PA     Allergies  Allergen Reactions  . Other Other (See Comments)    Seasonal allergies - sniffling      Family History  Problem Relation Age of Onset  . Arthritis Father        rheumatoid  . Hypertension Sister   . Cancer Sister   . Scoliosis Sister   . Arthritis Sister   . Osteoarthritis Mother   . Arthritis/Rheumatoid Sister   . Peripheral vascular  disease Sister   . Arrhythmia Sister   . CAD Brother      Social History   Socioeconomic History  . Marital status: Divorced    Spouse name: Not on file  . Number of children: Not on file  . Years of education: Not on file  . Highest education level: Not on file  Occupational History  . Not on file  Social Needs  . Financial resource strain: Not on file  . Food insecurity:    Worry: Not on file    Inability: Not on file  . Transportation needs:    Medical: Not on file    Non-medical: Not on file  Tobacco Use  . Smoking status: Current Some Day Smoker    Years: 53.00    Types: Cigarettes  . Smokeless tobacco: Never Used  Substance and Sexual Activity  . Alcohol use: Yes    Alcohol/week: 3.0 oz    Types: 5 Cans of beer per week  . Drug use: Yes    Types: Marijuana    Comment: h/o IVDA; "no IVDAsince the 1970s; last smoked pot ~ 10/2016"  . Sexual activity: Not Currently  Lifestyle  . Physical activity:    Days per week: Not on file    Minutes per session: Not on file  . Stress: Not on file  Relationships  . Social connections:    Talks on phone: Not on file    Gets together: Not on file    Attends religious service: Not on file    Active member of club or organization: Not on file    Attends meetings of clubs or organizations: Not on file    Relationship status: Not on file  .  Intimate partner violence:    Fear of current or ex partner: Not on file    Emotionally abused: Not on file    Physically abused: Not on file    Forced sexual activity: Not on file  Other Topics Concern  . Not on file  Social History Narrative   Entered 12/2013:   Lives with his Sister, Niece, and Mother   They take turns caring for mother, who is 35   Quit smoking in 2013     Review of Systems  Constitutional: Negative for appetite change, chills, diaphoresis, fever and unexpected weight change.  HENT: Negative.   Eyes: Negative.   Respiratory: Negative.   Cardiovascular: Negative.   Gastrointestinal: Negative.   Endocrine: Negative.   Genitourinary: Negative.   Musculoskeletal: Negative for arthralgias, neck pain and neck stiffness.  Skin: Positive for color change and rash. Negative for pallor.  Neurological: Negative.   Hematological: Negative.   Psychiatric/Behavioral: Negative.   All other systems reviewed and are negative.      Objective:    Vitals:   08/12/17 1232  BP: 118/70  Pulse: (!) 54  Temp: 97.8 F (36.6 C)  TempSrc: Oral  SpO2: 95%  Weight: 169 lb 6 oz (76.8 kg)            INCISION AND DRAINAGE Date/Time: 08/12/2017 12:50 PM Performed by: Danelle Berry, PA-C Authorized by: Danelle Berry, PA-C  Type: abscess Body area: neck Location details: left posterior neck Anesthesia: local infiltration  Anesthesia: Local Anesthetic: lidocaine 1% with epinephrine Anesthetic total: 1 mL  Sedation: Patient sedated: no  Scalpel size: 11 Incision type: single straight Incision depth: dermal Complexity: simple Drainage: purulent Drainage amount: moderate Wound treatment: wound left open Packing material: none Patient tolerance: Patient tolerated the procedure  well with no immediate complications      Vitals:   08/12/17 1232  BP: 118/70  Pulse: (!) 54  Temp: 97.8 F (36.6 C)  TempSrc: Oral  SpO2: 95%  Weight: 169 lb 6 oz (76.8 kg)        Physical Exam  Constitutional: He appears well-developed and well-nourished. No distress.  Elderly male, nontoxic-appearing, no acute distress  HENT:  Head: Normocephalic and atraumatic. Head is without right periorbital erythema and without left periorbital erythema.  Right Ear: External ear and ear canal normal. No mastoid tenderness.  Left Ear: External ear and ear canal normal. No mastoid tenderness.  Nose: Nose normal.  Mouth/Throat: Uvula is midline, oropharynx is clear and moist and mucous membranes are normal.  Eyes: Conjunctivae are normal. Right eye exhibits no discharge. Left eye exhibits no discharge.  Neck: Normal range of motion and phonation normal. Neck supple. No spinous process tenderness and no muscular tenderness present. No neck rigidity. No tracheal deviation and normal range of motion present.    Abscess to right posterior neck - see below  Cardiovascular: Normal rate, regular rhythm, normal heart sounds and intact distal pulses.  Pulmonary/Chest: Effort normal and breath sounds normal. No stridor. No respiratory distress.  Abdominal: Soft. Bowel sounds are normal.  Musculoskeletal: Normal range of motion. He exhibits no tenderness.  Lymphadenopathy:    He has no cervical adenopathy.  Neurological: He is alert. He exhibits normal muscle tone. Coordination normal.  Skin: Skin is warm and dry. Rash noted. No bruising and no ecchymosis noted. Rash is pustular. Rash is not nodular. He is not diaphoretic. There is erythema.  Enlarged area of edema, erythema to left scalp behind ear with crusting and ttp, indurated, no fluctuance - small erythematous papule from past visit appears largely unchanged  (see photo - most inferior lesion)  2 cm pustule to left posterior neck 2 cm inferior to hairline, tender to palpation, no surrounding erythema, edema, induration  Psychiatric: He has a normal mood and affect. His behavior is normal. Judgment and thought content normal.   Nursing note and vitals reviewed.             INCISION AND DRAINAGE Date/Time: 08/12/2017 12:50 PM Performed by: Danelle Berry, PA-C Authorized by: Danelle Berry, PA-C  Type: abscess Body area: neck Location details: left posterior neck Anesthesia: local infiltration  Anesthesia: Local Anesthetic: lidocaine 1% with epinephrine Anesthetic total: 1 mL  Sedation: Patient sedated: no  Scalpel size: 11 Incision type: single straight Incision depth: dermal Complexity: simple Drainage: purulent Drainage amount: moderate Wound treatment: wound left open Packing material: none Patient tolerance: Patient tolerated the procedure well with no immediate complications      Assessment & Plan:      ICD-10-CM   1. Abscess, neck L02.11 INCISION AND DRAINAGE   I&D warm soaks 2-3 x a day for ~15 min Doxy, recheck with first available appt here after weekend.  2. Cellulitis of scalp L03.811    secondary to folliculitis?  Hx of abseccess MRSA gentle exfoliation of scalp with his antibiotic/antiseptic soaps apply mupirocen 2x a day doxy abx f/up    Patient's recent pruritic papule from 1 week ago the bottom of his hairline, he states has worsened and no improvement with mupirocin.  Area of edema, erythema and tenderness is roughly 6 cm superior to where his prior lesion was.  Has large area of crusting over it also in the hairline.  Do not see any drainable abscess on the left  side of his scalp.  Will treat with doxycycline with his history of MRSA.  However it is unclear if this is secondary to the first small pruritic lesion, or may be folliculitis or some seborrheic dermatitis?  His most recent abscesses on his skin were perirectal, required drainage and antibiotic treatment, did grow MRSA with cultures.  When I evaluated him 1 week ago there was nothing to drain or culture. He returns with a separate and new pustule and abscess to his right posterior neck.  Was drained today  without any complications, did have purulent drainage, no surrounding cellulitis, doxy for other skin infection will cover.    Will need close follow-up with his history of recurrent skin infections, today he felt generally unwell but his vital signs were stable and he was well-appearing.  Discussed appropriate follow-up over the weekend if there is any worsening of his infection he would need to go to the ER.     Danelle Berry, PA-C 08/12/17 12:56 PM

## 2017-08-12 NOTE — Telephone Encounter (Signed)
Patient calling back with advice as to what to do about sores they are getting worse and he is having more

## 2017-08-12 NOTE — Telephone Encounter (Signed)
Spoke with patient and informed him per Chiquita Loth- He should come for another appointment.  The one sore he had at the time of our visit looked like a healing sore around a hair follicle.  Other skin infections in the past were abscesses that needed drainage or antibiotics.  We need to see them in office to determine the necessary dx and tx.   Patient verbalized understanding and is scheduled for today at 12:15 PM.

## 2017-08-13 ENCOUNTER — Telehealth: Payer: Self-pay

## 2017-08-13 NOTE — Telephone Encounter (Signed)
Remote ICM transmission received.  Attempted call to patient and left message with ICM number for return call with his mother.

## 2017-08-13 NOTE — Progress Notes (Signed)
Patient returned call.  He stated he is doing well and denied any symptoms.  Weight is stable between 167-169 lbs. Transmission reviewed.  Encouraged to call for any fluid symptoms.  No changes today.

## 2017-08-13 NOTE — Progress Notes (Addendum)
EPIC Encounter for ICM Monitoring  Patient Name: Tommy Robbins is a 72 y.o. male Date: 08/13/2017 Primary Care Physican: Deon Pilling Primary Cardiologist: Allyson Sabal Electrophysiologist: Allred Dry Weight:  unknown       1st ICM remote transmission. Attempted call to patient and unable to reach.  Left message with mother to return call.  Transmission reviewed.    Thoracic impedance normal.  Prescribed dosage: Furosemide 20 mg Take 20 mg by mouth daily as needed for fluid.   Recommendations: NONE - Unable to reach.  Follow-up plan: ICM clinic phone appointment on 09/14/2017.  Office appointment scheduled 09/10/2017 with Dr. Allyson Sabal.  Copy of ICM check sent to Dr. Johney Frame.   3 month ICM trend: 08/13/2017    1 Year ICM trend:       Karie Soda, RN 08/13/2017 4:48 PM

## 2017-08-17 ENCOUNTER — Ambulatory Visit (INDEPENDENT_AMBULATORY_CARE_PROVIDER_SITE_OTHER): Payer: BC Managed Care – PPO | Admitting: Family Medicine

## 2017-08-17 ENCOUNTER — Encounter: Payer: Self-pay | Admitting: Family Medicine

## 2017-08-17 VITALS — BP 98/63 | HR 57 | Temp 97.8°F | Resp 14 | Ht 70.0 in | Wt 166.2 lb

## 2017-08-17 DIAGNOSIS — L03811 Cellulitis of head [any part, except face]: Secondary | ICD-10-CM

## 2017-08-17 DIAGNOSIS — L0211 Cutaneous abscess of neck: Secondary | ICD-10-CM

## 2017-08-17 DIAGNOSIS — Z09 Encounter for follow-up examination after completed treatment for conditions other than malignant neoplasm: Secondary | ICD-10-CM

## 2017-08-17 NOTE — Patient Instructions (Signed)
Continue doxy, start selsun blue shampoo, d/c mupirocin ointment.  Continue to gently exfoliate scalp.  Return on Friday if left scalp is not better, may need another I&D, but it may drain on its own.

## 2017-08-17 NOTE — Progress Notes (Signed)
Patient ID: Tommy Robbins, male    DOB: April 03, 1945, 72 y.o.   MRN: [id number]  PCP: Dorena Bodo, PA-C  Chief Complaint  Patient presents with  . recheck sore on back of head    Subjective:   08/17/17  Tommy Robbins is a 71 y.o. male returns to clinic for I&D recheck of right posterior neck abscess and left posterior scalp cellulitis.  He is still taking doxycycline, pain is very minimal, to left scalp only occasional tenderness with direct palpation, rated 1/10, no constant pain.  Redness and swelling have drastically improved.  There is still some crusting and small area of swelling, but everything else is healing.  He denies any purulent drainage, fever, lymphadenopathy.  No other acute or associated symptoms     HPI 08/12/17:  Tommy Robbins is a 72 y.o. male, presents to clinic with CC of reported worsening bump to left scalp that did not improve with topical antibiotic.  I did see him 1 week ago for his initial complaint, appeared to be a small less than 1 cm area of swelling with a central hair follicle, that was nontender on exam, did not appear to be an abscess, his symptoms had started with a small bump being extremely itchy.  Infection is very superficial and felt that topical mupirocin would be adequate.  He did begin calling the office earlier this week, while I was not in clinic, stating that it was worsening however I did not receive this message until today and he was instructed to come in for reevaluation.  He has worsening, spreading area of swelling with redness and crusting to the left posterior edge of his scalp behind his ear.  Last week he had not tenderness, now he does have constant pain, it is sore, moderate, exacerbated with palpation and with any pressure.  Has had no drainage from this that he knows of.  He also has a new right abscess to his right posterior neck, tender, no spontaneous drainage, that is also tender.  He reports associated generalized malaise  and feels slightly tired but he denies any fever, myalgias, sweats, nausea, vomiting, headaches, weakness.   HPI 08/05/17: Tommy Robbins is a 72 y.o. male, presents to clinic with CC of itchy swollen bump to the back of his scalp on the left side.  It feels hard and has some scabbing on top.  He denies any drainage.  It does seem to have gotten smaller in size.  He presents because of concern with recent and recurrent abscesses, history of MRSA.  Last abscesses were on his buttock, required I&D and antibiotics.  He does use antibiotic body soap.  He does not have any other areas of swelling, nodules or infection.  Bump to the back of his head is not painful.     Patient Active Problem List   Diagnosis Date Noted  . Ischemic cardiomyopathy 03/24/2017  . CAD (coronary artery disease), native coronary artery 11/06/2016  . Systolic CHF (HCC) 11/06/2016  . Acute ST elevation myocardial infarction (STEMI) involving left anterior descending (LAD) coronary artery (HCC) 10/25/2016  . Actinic keratosis 07/25/2015  . Chronic low back pain   . COPD (chronic obstructive pulmonary disease) (HCC) 09/11/2012  . Hepatitis C 09/11/2012  . GERD (gastroesophageal reflux disease) 09/11/2012  . Insomnia 09/11/2012  . Hypothyroidism 09/20/2006  . HYPERTENSION, BENIGN ESSENTIAL 09/20/2006  . ECHOCARDIOGRAM, HX OF 09/20/2006     Prior to Admission medications   Medication  Sig Start Date End Date Taking? Authorizing Provider  aspirin EC 81 MG tablet Take 81 mg by mouth daily.   Yes [provider]  atorvastatin (LIPITOR) 80 MG tablet Take 1 tablet (80 mg total) by mouth daily at 6 PM. 11/06/16  Yes [place], Velna Hatchet, MD  clopidogrel (PLAVIX) 75 MG tablet Take 1 tablet (75 mg total) by mouth daily. Resume 03/27/17 03/25/17  Yes [place], Amber K, NP  cycloSPORINE (RESTASIS) 0.05 % ophthalmic emulsion Place 1 drop into both eyes 2 (two) times daily.   Yes [provider]  diclofenac sodium (VOLTAREN) 1  % GEL Apply 1 application topically daily as needed (for pain).   Yes [provider]  gabapentin (NEURONTIN) 100 MG capsule Take 200 mg by mouth at bedtime.  03/03/17  Yes [provider]  isosorbide mononitrate (IMDUR) 30 MG 24 hr tablet Take 1 tablet (30 mg total) by mouth daily. 01/20/17  Yes Azalee Course, PA  levothyroxine (SYNTHROID, LEVOTHROID) 88 MCG tablet Take 1 tablet (88 mcg total) by mouth daily before breakfast. 07/19/17  Yes [place], Velna Hatchet, MD  lidocaine (LIDODERM) 5 % Use up to 3 patches on skin daily as needed for pain 10/17/14  Yes [provider]  lisinopril (PRINIVIL,ZESTRIL) 20 MG tablet TAKE 1 TABLET DAILY 07/02/17  Yes [place], Velna Hatchet, MD  metoprolol succinate (TOPROL-XL) 50 MG 24 hr tablet Take 1 tablet (50 mg total) by mouth daily. Take with or immediately following a meal. 11/30/16  Yes Meng, [place], PA  morphine (MS CONTIN) 15 MG 12 hr tablet Take 15 mg by mouth See admin instructions. Take 15 mg by mouth in the morning with 30 mg to equal 45 mg dose 06/30/15  Yes [provider]  morphine (MS CONTIN) 30 MG 12 hr tablet Take 30 mg by mouth See admin instructions. Takes 30 mg by mouth in the morning with 15 mg to equal 45 mg dose, 30 mg in the afternoon, and 30 mg at night   Yes [provider]  mupirocin ointment (BACTROBAN) 2 % Apply 1 application topically 2 (two) times daily for 7 days. To affected area of skin 08/05/17 08/12/17 Yes Danelle Berry, PA-C  Nutritional Supplements (JUICE PLUS FIBRE PO) Take 2 capsules by mouth 2 (two) times daily.   Yes [provider]  pantoprazole (PROTONIX) 40 MG tablet Take 1 tablet (40 mg total) by mouth daily. 11/06/16  Yes [place], Velna Hatchet, MD  spironolactone (ALDACTONE) 25 MG tablet TAKE 1 TABLET DAILY 07/02/17  Yes [place], Velna Hatchet, MD  temazepam (RESTORIL) 15 MG capsule Take 1 capsule (15 mg total) by mouth at bedtime as needed for sleep. 07/02/17  Yes [place], Velna Hatchet, MD  traZODone  (DESYREL) 50 MG tablet Take 25 mg by mouth at bedtime.  09/02/16  Yes [provider]  varenicline (CHANTIX CONTINUING MONTH PAK) 1 MG tablet Take 1 tablet (1 mg total) by mouth 2 (two) times daily. 06/14/17  Yes Azalee Course, PA  doxycycline (VIBRA-TABS) 100 MG tablet Take 1 tablet (100 mg total) by mouth 2 (two) times daily. 08/12/17   Danelle Berry, PA-C  furosemide (LASIX) 20 MG tablet Take 1 tablet (20 mg total) by mouth daily as needed. Patient taking differently: Take 20 mg by mouth daily as needed for fluid.  01/20/17 04/20/17  Azalee Course, PA  nitroGLYCERIN (NITROSTAT) 0.4 MG SL tablet Place 1 tablet (0.4 mg total) under the tongue every 5 (five) minutes as needed for chest pain. Patient  not taking: Reported on 08/12/2017 01/20/17   Azalee Course, PA     Allergies  Allergen Reactions  . Other Other (See Comments)    Seasonal allergies - sniffling      Family History  Problem Relation Age of Onset  . Arthritis Father        rheumatoid  . Hypertension Sister   . Cancer Sister   . Scoliosis Sister   . Arthritis Sister   . Osteoarthritis Mother   . Arthritis/Rheumatoid Sister   . Peripheral vascular disease Sister   . Arrhythmia Sister   . CAD Brother      Social History   Socioeconomic History  . Marital status: Divorced    Spouse name: Not on file  . Number of children: Not on file  . Years of education: Not on file  . Highest education level: Not on file  Occupational History  . Not on file  Social Needs  . Financial resource strain: Not on file  . Food insecurity:    Worry: Not on file    Inability: Not on file  . Transportation needs:    Medical: Not on file    Non-medical: Not on file  Tobacco Use  . Smoking status: Former Smoker    Years: 53.00    Types: Cigarettes    Last attempt to quit: 04/19/2017    Years since quitting: 0.3  . Smokeless tobacco: Never Used  Substance and Sexual Activity  . Alcohol use: Yes    Alcohol/week: 3.0 oz    Types: 5 Cans  of beer per week  . Drug use: Yes    Types: Marijuana    Comment: h/o IVDA; "no IVDAsince the 1970s; last smoked pot ~ 10/2016"  . Sexual activity: Not Currently  Lifestyle  . Physical activity:    Days per week: Not on file    Minutes per session: Not on file  . Stress: Not on file  Relationships  . Social connections:    Talks on phone: Not on file    Gets together: Not on file    Attends religious service: Not on file    Active member of club or organization: Not on file    Attends meetings of clubs or organizations: Not on file    Relationship status: Not on file  . Intimate partner violence:    Fear of current or ex partner: Not on file    Emotionally abused: Not on file    Physically abused: Not on file    Forced sexual activity: Not on file  Other Topics Concern  . Not on file  Social History Narrative   Entered 12/2013:   Lives with his Sister, Niece, and Mother   They take turns caring for mother, who is 30   Quit smoking in 2013     Review of Systems  Constitutional: Negative for appetite change, chills, diaphoresis, fever and unexpected weight change.  HENT: Negative.   Eyes: Negative.   Respiratory: Negative.   Cardiovascular: Negative.   Gastrointestinal: Negative.   Endocrine: Negative.   Genitourinary: Negative.   Musculoskeletal: Negative for arthralgias, neck pain and neck stiffness.  Skin: Positive for color change and rash. Negative for pallor.  Neurological: Negative.   Hematological: Negative.   Psychiatric/Behavioral: Negative.   All other systems reviewed and are negative.      Objective:    Vitals:   08/17/17 1018  BP: 98/63  Pulse: (!) 57  Resp: 14  Temp: 97.8 F (  36.6 C)  TempSrc: Oral  SpO2: 97%  Weight: 166 lb 3.2 oz (75.4 kg)  Height: 5\' 10"  (1.778 m)       Physical Exam  Constitutional: He appears well-developed and well-nourished. No distress.  Elderly male, nontoxic-appearing, no acute distress  HENT:  Head:  Normocephalic and atraumatic. Head is without right periorbital erythema and without left periorbital erythema.  Right Ear: External ear and ear canal normal. No mastoid tenderness.  Left Ear: External ear and ear canal normal. No mastoid tenderness.  Nose: Nose normal.  Mouth/Throat: Uvula is midline, oropharynx is clear and moist and mucous membranes are normal.  Eyes: Conjunctivae are normal. Right eye exhibits no discharge. Left eye exhibits no discharge.  Neck: Normal range of motion and phonation normal. Neck supple. No spinous process tenderness and no muscular tenderness present. No neck rigidity. No tracheal deviation and normal range of motion present.  Cardiovascular: Normal rate, regular rhythm, normal heart sounds and intact distal pulses.  Pulmonary/Chest: Effort normal and breath sounds normal. No stridor. No respiratory distress.  Abdominal: Soft. Bowel sounds are normal.  Musculoskeletal: Normal range of motion. He exhibits no tenderness.  Lymphadenopathy:       Head (right side): No preauricular, no posterior auricular and no occipital adenopathy present.       Head (left side): No preauricular, no posterior auricular and no occipital adenopathy present.    He has no cervical adenopathy.  Neurological: He is alert. He exhibits normal muscle tone. Coordination normal.  Skin: Skin is warm and dry. Capillary refill takes less than 2 seconds. Rash noted. No bruising and no ecchymosis noted. Rash is not nodular and not vesicular. He is not diaphoretic. No erythema.  Improving edema and erythema to left posterior scalp with central crusting Right posterior neck abscess healing with small papule with central scab, no edema, induration, fluctuance or drainage  Psychiatric: He has a normal mood and affect. His behavior is normal. Judgment and thought content normal.  Nursing note and vitals reviewed.   Image from 08/17/17     Image from 08/12/17:     Image from  08/12/17:       Assessment & Plan:      ICD-10-CM   1. Cellulitis of scalp L03.811   2. Encounter for recheck of abscess following incision and drainage Z09   3. Abscess, neck L02.11      Cellulitis of scalp improving significantly, continue doxycycline until completion Recheck of I&D of abscess of posterior right neck shows significant improvement without any concern for cannulation of purulence or spreading infection  Patient will stop applying mupirocin to scalp scabbing and crusting, because of extensive involvement with his scalp, has advised him to try using [facility]    Danelle Berry, PA-C 08/17/17 10:47 AM

## 2017-08-18 ENCOUNTER — Other Ambulatory Visit: Payer: Self-pay | Admitting: Family Medicine

## 2017-09-10 ENCOUNTER — Encounter: Payer: Self-pay | Admitting: Cardiovascular Disease

## 2017-09-10 ENCOUNTER — Ambulatory Visit (INDEPENDENT_AMBULATORY_CARE_PROVIDER_SITE_OTHER): Payer: BC Managed Care – PPO | Admitting: Cardiovascular Disease

## 2017-09-10 VITALS — BP 107/64 | HR 54 | Ht 70.0 in | Wt 171.4 lb

## 2017-09-10 DIAGNOSIS — I255 Ischemic cardiomyopathy: Secondary | ICD-10-CM

## 2017-09-10 DIAGNOSIS — I2510 Atherosclerotic heart disease of native coronary artery without angina pectoris: Secondary | ICD-10-CM | POA: Diagnosis not present

## 2017-09-10 DIAGNOSIS — I2102 ST elevation (STEMI) myocardial infarction involving left anterior descending coronary artery: Secondary | ICD-10-CM

## 2017-09-10 NOTE — Progress Notes (Signed)
09/10/2017 Duke Salvia Nerio   [birth year]  [id number]  Primary Physician Dorena Bodo, PA-C Primary Cardiologist: Runell Gess MD Milagros Loll, [place], [place]  HPI:  Tommy Robbins is a 72 y.o.  who had witnessed sudden cardiac death on 2016-11-23. He went bystander CPR by his niece. His return of spontaneous circulation was 17 minutes. His initial rhythm was ventricular fibrillation. He was brought urgently to the cath lab where I performed cardiac catheterization revealing three-vessel disease. I attempted unsuccessfully to cross a LAD CTO. It was also decided to treat him medically. I did place an intra-aortic balloon pump because of hemodynamic instability. Ultimately, he regained consciousness, was extubated and discharged home. He has since stopped smoking. 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 I last saw him in the office 02/10/2017.  Company by his Sister Clydie Braun today.  He did have an ICD placed electively by Dr. Johney Frame 03/24/2017 for secondary prevention.  He has not had a ICD discharge.  He does complain of dyspnea on exertion and is probably Class III.     Current Meds  Medication Sig  . aspirin EC 81 MG tablet Take 81 mg by mouth daily.  Marland Kitchen atorvastatin (LIPITOR) 80 MG tablet TAKE 1 TABLET DAILY AT 6PM  . clopidogrel (PLAVIX) 75 MG tablet Take 1 tablet (75 mg total) by mouth daily. Resume 03/27/17  . cycloSPORINE (RESTASIS) 0.05 % ophthalmic emulsion Place 1 drop into both eyes 2 (two) times daily.  . diclofenac sodium (VOLTAREN) 1 % GEL Apply 1 application topically daily as needed (for pain).  Marland Kitchen doxycycline (VIBRA-TABS) 100 MG tablet Take 1 tablet (100 mg total) by mouth 2 (two) times daily.  Marland Kitchen gabapentin (NEURONTIN) 100 MG capsule Take 200 mg by mouth at bedtime.   . isosorbide mononitrate (IMDUR) 30 MG 24 hr tablet Take 1 tablet (30 mg total) by mouth daily.  Marland Kitchen levothyroxine (SYNTHROID, LEVOTHROID) 88 MCG tablet Take 1 tablet (88 mcg total) by mouth daily  before breakfast.  . lidocaine (LIDODERM) 5 % Use up to 3 patches on skin daily as needed for pain  . lisinopril (PRINIVIL,ZESTRIL) 20 MG tablet TAKE 1 TABLET DAILY  . metoprolol succinate (TOPROL-XL) 50 MG 24 hr tablet Take 1 tablet (50 mg total) by mouth daily. Take with or immediately following a meal.  . morphine (MS CONTIN) 15 MG 12 hr tablet Take 15 mg by mouth See admin instructions. Take 15 mg by mouth in the morning with 30 mg to equal 45 mg dose  . morphine (MS CONTIN) 30 MG 12 hr tablet Take 30 mg by mouth See admin instructions. Takes 30 mg by mouth in the morning with 15 mg to equal 45 mg dose, 30 mg in the afternoon, and 30 mg at night  . nitroGLYCERIN (NITROSTAT) 0.4 MG SL tablet Place 1 tablet (0.4 mg total) under the tongue every 5 (five) minutes as needed for chest pain.  . Nutritional Supplements (JUICE PLUS FIBRE PO) Take 2 capsules by mouth 2 (two) times daily.  . pantoprazole (PROTONIX) 40 MG tablet Take 1 tablet (40 mg total) by mouth daily.  Marland Kitchen spironolactone (ALDACTONE) 25 MG tablet TAKE 1 TABLET DAILY  . temazepam (RESTORIL) 15 MG capsule Take 1 capsule (15 mg total) by mouth at bedtime as needed for sleep.  . traZODone (DESYREL) 50 MG tablet Take 25 mg by mouth at bedtime.   . varenicline (CHANTIX CONTINUING MONTH PAK) 1 MG  tablet Take 1 tablet (1 mg total) by mouth 2 (two) times daily.     Allergies  Allergen Reactions  . Other Other (See Comments)    Seasonal allergies - sniffling     Social History   Socioeconomic History  . Marital status: Divorced    Spouse name: Not on file  . Number of children: Not on file  . Years of education: Not on file  . Highest education level: Not on file  Occupational History  . Not on file  Social Needs  . Financial resource strain: Not on file  . Food insecurity:    Worry: Not on file    Inability: Not on file  . Transportation needs:    Medical: Not on file    Non-medical: Not on file  Tobacco Use  . Smoking  status: Former Smoker    Years: 53.00    Types: Cigarettes    Last attempt to quit: 04/19/2017    Years since quitting: 0.3  . Smokeless tobacco: Never Used  Substance and Sexual Activity  . Alcohol use: Yes    Alcohol/week: 3.0 oz    Types: 5 Cans of beer per week  . Drug use: Yes    Types: Marijuana    Comment: h/o IVDA; "no IVDAsince the 1970s; last smoked pot ~ 10/2016"  . Sexual activity: Not Currently  Lifestyle  . Physical activity:    Days per week: Not on file    Minutes per session: Not on file  . Stress: Not on file  Relationships  . Social connections:    Talks on phone: Not on file    Gets together: Not on file    Attends religious service: Not on file    Active member of club or organization: Not on file    Attends meetings of clubs or organizations: Not on file    Relationship status: Not on file  . Intimate partner violence:    Fear of current or ex partner: Not on file    Emotionally abused: Not on file    Physically abused: Not on file    Forced sexual activity: Not on file  Other Topics Concern  . Not on file  Social History Narrative   Entered 12/2013:   Lives with his Sister, Niece, and Mother   They take turns caring for mother, who is 35   Quit smoking in 2013     Review of Systems: General: negative for chills, fever, night sweats or weight changes.  Cardiovascular: negative for chest pain, dyspnea on exertion, edema, orthopnea, palpitations, paroxysmal nocturnal dyspnea or shortness of breath Dermatological: negative for rash Respiratory: negative for cough or wheezing Urologic: negative for hematuria Abdominal: negative for nausea, vomiting, diarrhea, bright red blood per rectum, melena, or hematemesis Neurologic: negative for visual changes, syncope, or dizziness All other systems reviewed and are otherwise negative except as noted above.    Blood pressure 107/64, pulse (!) 54, height 5\' 10"  (1.778 m), weight 171 lb 6.4 oz (77.7 kg), SpO2  100 %.  General appearance: alert and no distress Neck: no adenopathy, no carotid bruit, no JVD, supple, symmetrical, trachea midline and thyroid not enlarged, symmetric, no tenderness/mass/nodules Lungs: clear to auscultation bilaterally Heart: regular rate and rhythm, S1, S2 normal, no murmur, click, rub or gallop Extremities: extremities normal, atraumatic, no cyanosis or edema Pulses: 2+ and symmetric Skin: Skin color, texture, turgor normal. No rashes or lesions Neurologic: Alert and oriented X 3, normal strength and tone. Normal  symmetric reflexes. Normal coordination and gait  EKG not performed today  ASSESSMENT AND PLAN:   HYPERTENSION, BENIGN ESSENTIAL History of essential hypertension with blood pressure measured today at 107/64.  He is on lisinopril and metoprolol.  Acute ST elevation myocardial infarction (STEMI) involving left anterior descending (LAD) coronary artery (HCC) History of anterior STEMI in the setting of sudden cardiac death successful attempt recanalizing a LAD CTO.  He did have three-vessel disease but was not a candidate for surgical revascularization and it was elected to treat him medically.  Did place in a balloon pump at the time for mechanical support.  Ischemic cardiomyopathy History of ischemic cardia myopathy with an EF 2D echo in the 35 to 40% range.  He has three-vessel disease with an aborted episode of sudden cardiac death on 11-05-17.  He does complain of shortness of breath on exertion .  He is probably class III.  I am going to refer him to the advanced heart failure clinic for pharmacologic optimization.      Runell Gess MD FACP,FACC,FAHA, [facility] 09/10/2017 2:56 PM

## 2017-09-10 NOTE — Assessment & Plan Note (Signed)
History of ischemic cardia myopathy with an EF 2D echo in the 35 to 40% range.  He has three-vessel disease with an aborted episode of sudden cardiac death on 12-04-17.  He does complain of shortness of breath on exertion .  He is probably class III.  I am going to refer him to the advanced heart failure clinic for pharmacologic optimization.

## 2017-09-10 NOTE — Patient Instructions (Signed)
Medication Instructions: Your physician recommends that you continue on your current medications as directed. Please refer to the Current Medication list given to you today.    Testing/Procedures: Your physician has requested that you have an echocardiogram. Echocardiography is a painless test that uses sound waves to create images of your heart. It provides your doctor with information about the size and shape of your heart and how well your heart's chambers and valves are working. This procedure takes approximately one hour. There are no restrictions for this procedure.    Follow-Up: You have been referred to the heart failure clinic.  We request that you follow-up in: 6 months with Wynema Birch Mehng< PA  and in 12 months with Dr San Morelle will receive a reminder letter in the mail two months in advance. If you don't receive a letter, please call our office to schedule the follow-up appointment.  If you need a refill on your cardiac medications before your next appointment, please call your pharmacy.

## 2017-09-10 NOTE — Assessment & Plan Note (Signed)
History of anterior STEMI in the setting of sudden cardiac death successful attempt recanalizing a LAD CTO.  He did have three-vessel disease but was not a candidate for surgical revascularization and it was elected to treat him medically.  Did place in a balloon pump at the time for mechanical support.

## 2017-09-10 NOTE — Assessment & Plan Note (Signed)
History of essential hypertension with blood pressure measured today at 107/64.  He is on lisinopril and metoprolol.

## 2017-09-14 ENCOUNTER — Ambulatory Visit (INDEPENDENT_AMBULATORY_CARE_PROVIDER_SITE_OTHER): Payer: BC Managed Care – PPO

## 2017-09-14 DIAGNOSIS — I5022 Chronic systolic (congestive) heart failure: Secondary | ICD-10-CM | POA: Diagnosis not present

## 2017-09-14 DIAGNOSIS — Z95810 Presence of automatic (implantable) cardiac defibrillator: Secondary | ICD-10-CM | POA: Diagnosis not present

## 2017-09-14 NOTE — Progress Notes (Signed)
EPIC Encounter for ICM Monitoring  Patient Name: Tommy Robbins is a 72 y.o. male Date: 09/14/2017 Primary Care Physican: Dorena Bodo, PA-C Primary Cardiologist: Allyson Sabal Electrophysiologist: Allred Dry Weight:     unknown       Heart Failure questions reviewed, pt symptomatic with shortness of breath when climbing stairs or trying to do something quickly.  The SOB started around same time of decreased impedance.  He has been eating out more and not limiting salt in his diet. He does not salt food but does not check for sodium already in food.    Thoracic impedance abnormal suggesting fluid accumulation starting 08/29/2017.  Prescribed dosage: Furosemide 20 mg take 1 tablet by mouth daily as needed for fluid.   Recommendations:  Advised to take Furosemide 20 mg take 1 tablet x 3 days and then return to PRN.  Advised if he feels dizzy or lightheadedness to call back before taking any   Follow-up plan: ICM clinic phone appointment on 09/22/2017 to recheck fluid levels.    Copy of ICM check sent to Dr. Allyson Sabal and Dr. Johney Frame.   3 month ICM trend: 09/14/2017    1 Year ICM trend:       Karie Soda, RN 09/14/2017 5:05 PM

## 2017-09-22 ENCOUNTER — Ambulatory Visit (INDEPENDENT_AMBULATORY_CARE_PROVIDER_SITE_OTHER): Payer: BC Managed Care – PPO | Admitting: *Deleted

## 2017-09-22 ENCOUNTER — Ambulatory Visit (INDEPENDENT_AMBULATORY_CARE_PROVIDER_SITE_OTHER): Payer: Medicare Other

## 2017-09-22 DIAGNOSIS — I255 Ischemic cardiomyopathy: Secondary | ICD-10-CM

## 2017-09-22 DIAGNOSIS — I5022 Chronic systolic (congestive) heart failure: Secondary | ICD-10-CM

## 2017-09-22 DIAGNOSIS — Z95810 Presence of automatic (implantable) cardiac defibrillator: Secondary | ICD-10-CM

## 2017-09-22 NOTE — Progress Notes (Signed)
EPIC Encounter for ICM Monitoring  Patient Name: Tommy Robbins is a 72 y.o. male Date: 09/22/2017 Primary Care Physican: Dorena Bodo, PA-C Primary Cardiologist:Berry Electrophysiologist:Allred Dry Weight:unknown      Heart Failure questions reviewed, pt asymptomatic.   Thoracic impedance returned to normal after taking 3 days of PRN Furosemide.   Prescribed dosage: Furosemide20 mgtake 1 tablet by mouth daily as needed for fluid.  Recommendations: No changes.   Encouraged to call for fluid symptoms.  Follow-up plan: ICM clinic phone appointment on 10/21/2017.  Office appointment scheduled 11/10/2017 with Dr. Gala Romney for 1st HF clinic visit.   Copy of ICM check sent to Dr. Johney Frame.   3 month ICM trend: 09/22/2017    1 Year ICM trend:       Karie Soda, RN 09/22/2017 8:45 AM

## 2017-09-22 NOTE — Progress Notes (Signed)
Remote ICD transmission.   

## 2017-09-28 ENCOUNTER — Other Ambulatory Visit (HOSPITAL_COMMUNITY): Payer: Self-pay

## 2017-10-06 ENCOUNTER — Ambulatory Visit (HOSPITAL_COMMUNITY): Payer: BC Managed Care – PPO | Attending: Cardiovascular Disease

## 2017-10-06 ENCOUNTER — Other Ambulatory Visit: Payer: Self-pay

## 2017-10-06 DIAGNOSIS — I255 Ischemic cardiomyopathy: Secondary | ICD-10-CM | POA: Insufficient documentation

## 2017-10-06 DIAGNOSIS — Z87891 Personal history of nicotine dependence: Secondary | ICD-10-CM | POA: Insufficient documentation

## 2017-10-06 DIAGNOSIS — J449 Chronic obstructive pulmonary disease, unspecified: Secondary | ICD-10-CM | POA: Diagnosis not present

## 2017-10-06 DIAGNOSIS — I110 Hypertensive heart disease with heart failure: Secondary | ICD-10-CM | POA: Insufficient documentation

## 2017-10-06 DIAGNOSIS — I2102 ST elevation (STEMI) myocardial infarction involving left anterior descending coronary artery: Secondary | ICD-10-CM | POA: Diagnosis present

## 2017-10-06 DIAGNOSIS — I509 Heart failure, unspecified: Secondary | ICD-10-CM | POA: Diagnosis not present

## 2017-10-06 DIAGNOSIS — I219 Acute myocardial infarction, unspecified: Secondary | ICD-10-CM | POA: Diagnosis not present

## 2017-10-06 DIAGNOSIS — I081 Rheumatic disorders of both mitral and tricuspid valves: Secondary | ICD-10-CM | POA: Insufficient documentation

## 2017-10-06 DIAGNOSIS — I2510 Atherosclerotic heart disease of native coronary artery without angina pectoris: Secondary | ICD-10-CM | POA: Diagnosis not present

## 2017-10-12 LAB — CUP PACEART REMOTE DEVICE CHECK
Battery Remaining Longevity: 129 mo
Brady Statistic AP VS Percent: 7.04 %
Brady Statistic AS VP Percent: 0.04 %
Brady Statistic RA Percent Paced: 6.96 %
Brady Statistic RV Percent Paced: 0.05 %
Date Time Interrogation Session: 20190703052203
HIGH POWER IMPEDANCE MEASURED VALUE: 64 Ohm
Implantable Lead Implant Date: 20190102
Implantable Lead Location: 753859
Implantable Lead Location: 753860
Implantable Lead Model: 5076
Implantable Pulse Generator Implant Date: 20190102
Lead Channel Impedance Value: 323 Ohm
Lead Channel Impedance Value: 380 Ohm
Lead Channel Pacing Threshold Amplitude: 0.5 V
Lead Channel Pacing Threshold Amplitude: 0.625 V
Lead Channel Sensing Intrinsic Amplitude: 2.625 mV
Lead Channel Setting Pacing Amplitude: 2.5 V
Lead Channel Setting Pacing Pulse Width: 0.4 ms
Lead Channel Setting Sensing Sensitivity: 0.3 mV
MDC IDC LEAD IMPLANT DT: 20190102
MDC IDC MSMT BATTERY VOLTAGE: 3.03 V
MDC IDC MSMT LEADCHNL RA IMPEDANCE VALUE: 437 Ohm
MDC IDC MSMT LEADCHNL RA PACING THRESHOLD PULSEWIDTH: 0.4 ms
MDC IDC MSMT LEADCHNL RA SENSING INTR AMPL: 2.625 mV
MDC IDC MSMT LEADCHNL RV PACING THRESHOLD PULSEWIDTH: 0.4 ms
MDC IDC MSMT LEADCHNL RV SENSING INTR AMPL: 14.125 mV
MDC IDC MSMT LEADCHNL RV SENSING INTR AMPL: 14.125 mV
MDC IDC SET LEADCHNL RA PACING AMPLITUDE: 2 V
MDC IDC STAT BRADY AP VP PERCENT: 0.01 %
MDC IDC STAT BRADY AS VS PERCENT: 92.91 %

## 2017-10-13 DIAGNOSIS — H52213 Irregular astigmatism, bilateral: Secondary | ICD-10-CM | POA: Diagnosis not present

## 2017-10-20 ENCOUNTER — Other Ambulatory Visit: Payer: Self-pay

## 2017-10-20 ENCOUNTER — Ambulatory Visit (INDEPENDENT_AMBULATORY_CARE_PROVIDER_SITE_OTHER): Payer: BC Managed Care – PPO | Admitting: Physician Assistant

## 2017-10-20 ENCOUNTER — Encounter: Payer: Self-pay | Admitting: Physician Assistant

## 2017-10-20 VITALS — BP 94/60 | HR 54 | Temp 98.6°F | Resp 14 | Ht 70.0 in | Wt 171.8 lb

## 2017-10-20 DIAGNOSIS — L0231 Cutaneous abscess of buttock: Secondary | ICD-10-CM

## 2017-10-20 MED ORDER — SULFAMETHOXAZOLE-TRIMETHOPRIM 800-160 MG PO TABS: 1.0000 | ORAL_TABLET | Freq: Two times a day (BID) | ORAL | 0 refills | Status: AC

## 2017-10-20 NOTE — Addendum Note (Signed)
Addended by: Phineas Semen A on: 10/20/2017 09:51 AM   Modules accepted: Orders

## 2017-10-20 NOTE — Progress Notes (Signed)
Patient ID: Tommy Robbins MRN: [id number], DOB: 12/31/1945, 72 y.o. Date of Encounter: 71/31/2019, 9:30 AM    Chief Complaint:  Chief Complaint  Patient presents with  . sore on right gluteal    symptoms for 1 week      HPI: 72 y.o. year old male presents with above.   He reports that he actually started noticing this sore/bump develop 1-1/2 weeks ago.  Reports that on Sunday morning ( 4 days ago) he rolled over in bed and could tell that that movement because the site to "burst" and it drained at that time.  Reviewed that he has a history of MRSA.   Wound culture 05/2017 showed MRSA.  Also reviewed that he was here with abscess of the neck in 07/2017.  Was treated with doxycycline at that time.  He has no other concerns to address today. Has had no fevers or chills.     Home Meds:   Outpatient Medications Prior to Visit  Medication Sig Dispense Refill  . aspirin EC 81 MG tablet Take 81 mg by mouth daily.    Marland Kitchen atorvastatin (LIPITOR) 80 MG tablet TAKE 1 TABLET DAILY AT 6PM 90 tablet 2  . clopidogrel (PLAVIX) 75 MG tablet Take 1 tablet (75 mg total) by mouth daily. Resume 03/27/17 90 tablet 2  . cycloSPORINE (RESTASIS) 0.05 % ophthalmic emulsion Place 1 drop into both eyes 2 (two) times daily.    . diclofenac sodium (VOLTAREN) 1 % GEL Apply 1 application topically daily as needed (for pain).    Marland Kitchen doxycycline (VIBRA-TABS) 100 MG tablet Take 1 tablet (100 mg total) by mouth 2 (two) times daily. 20 tablet 0  . gabapentin (NEURONTIN) 100 MG capsule Take 200 mg by mouth at bedtime.   2  . isosorbide mononitrate (IMDUR) 30 MG 24 hr tablet Take 1 tablet (30 mg total) by mouth daily. 90 tablet 3  . levothyroxine (SYNTHROID, LEVOTHROID) 88 MCG tablet Take 1 tablet (88 mcg total) by mouth daily before breakfast. 90 tablet 2  . lidocaine (LIDODERM) 5 % Use up to 3 patches on skin daily as needed for pain  4  . lisinopril (PRINIVIL,ZESTRIL) 20 MG tablet TAKE 1 TABLET DAILY 90 tablet 2  .  metoprolol succinate (TOPROL-XL) 50 MG 24 hr tablet Take 1 tablet (50 mg total) by mouth daily. Take with or immediately following a meal. 90 tablet 3  . morphine (MS CONTIN) 15 MG 12 hr tablet Take 15 mg by mouth See admin instructions. Take 15 mg by mouth in the morning with 30 mg to equal 45 mg dose  0  . morphine (MS CONTIN) 30 MG 12 hr tablet Take 30 mg by mouth See admin instructions. Takes 30 mg by mouth in the morning with 15 mg to equal 45 mg dose, 30 mg in the afternoon, and 30 mg at night    . nitroGLYCERIN (NITROSTAT) 0.4 MG SL tablet Place 1 tablet (0.4 mg total) under the tongue every 5 (five) minutes as needed for chest pain. 25 tablet 3  . Nutritional Supplements (JUICE PLUS FIBRE PO) Take 2 capsules by mouth 2 (two) times daily.    . pantoprazole (PROTONIX) 40 MG tablet Take 1 tablet (40 mg total) by mouth daily. 90 tablet 2  . spironolactone (ALDACTONE) 25 MG tablet TAKE 1 TABLET DAILY 90 tablet 2  . temazepam (RESTORIL) 15 MG capsule Take 1 capsule (15 mg total) by mouth at bedtime as needed for sleep. 45 capsule  1  . traZODone (DESYREL) 50 MG tablet Take 25 mg by mouth at bedtime.   2  . varenicline (CHANTIX CONTINUING MONTH PAK) 1 MG tablet Take 1 tablet (1 mg total) by mouth 2 (two) times daily. 60 tablet 1  . furosemide (LASIX) 20 MG tablet Take 1 tablet (20 mg total) by mouth daily as needed. (Patient taking differently: Take 20 mg by mouth daily as needed for fluid. ) 90 tablet 3   No facility-administered medications prior to visit.     Allergies:  Allergies  Allergen Reactions  . Other Other (See Comments)    Seasonal allergies - sniffling       Review of Systems: See HPI for pertinent ROS. All other ROS negative.    Physical Exam: Blood pressure 94/60, pulse (!) 54, temperature 98.6 F (37 C), temperature source Oral, resp. rate 14, height 5\' 10"  (1.778 m), weight 77.9 kg (171 lb 12.8 oz), SpO2 98 %., Body mass index is 24.65 kg/m. General: WNWD WM.  Appears  in no acute distress.  Neck: Supple. No thyromegaly. No lymphadenopathy. Lungs: Clear bilaterally to auscultation without wheezes, rales, or rhonchi. Breathing is unlabored. Heart: Regular rhythm. No murmurs, rubs, or gallops. Msk:  Strength and tone normal for age. Skin: Right Buttock: 1cm diameter area of firmness. Open, draining area almost 1cm diameter.  Neuro: Alert and oriented X 3. Moves all extremities spontaneously. Gait is normal. CNII-XII grossly in tact. Psych:  Responds to questions appropriately with a normal affect.     ASSESSMENT AND PLAN:  72 y.o. year old male with  1. Abscess of buttock, right Site is already open and draining.  I will send wound culture just to check for any resistance that may be developing. Last culture was sensitive to both doxycycline and Bactrim.   He was treated with doxycycline in 5/19 so will switch to Bactrim at this time to try to prevent resistance. He is to start the Bactrim immediately take as directed to complete it.  If site does not resolve with this treatment then follow-up. - WOUND CULTURE - sulfamethoxazole-trimethoprim (BACTRIM DS,SEPTRA DS) 800-160 MG tablet; Take 1 tablet by mouth 2 (two) times daily for 14 days.  Dispense: 28 tablet; Refill: 0   Signed, [address] [place], [place], [facility] 10/20/2017 9:30 AM

## 2017-10-21 ENCOUNTER — Ambulatory Visit (INDEPENDENT_AMBULATORY_CARE_PROVIDER_SITE_OTHER): Payer: BC Managed Care – PPO

## 2017-10-21 DIAGNOSIS — I5022 Chronic systolic (congestive) heart failure: Secondary | ICD-10-CM | POA: Diagnosis not present

## 2017-10-21 DIAGNOSIS — Z95810 Presence of automatic (implantable) cardiac defibrillator: Secondary | ICD-10-CM

## 2017-10-21 NOTE — Progress Notes (Signed)
EPIC Encounter for ICM Monitoring  Patient Name: Tommy Robbins is a 72 y.o. male Date: 10/21/2017 Primary Care Physican: Dorena Bodo, PA-C Primary Cardiologist:Berry Electrophysiologist:Allred Dry Weight:unknown        Heart Failure questions reviewed, pt asymptomatic.   Thoracic impedance normal.  Prescribed dosage: Furosemide20 mgtake1 tabletby mouth daily as needed for fluid.  Recommendations:  No changes.    Encouraged to call for fluid symptoms.  Follow-up plan: ICM clinic phone appointment on 11/23/2017.   Office appointment scheduled 11/10/2017 with Dr. Gala Romney for 1st HF clinic visit.   Copy of ICM check sent to Dr. Johney Frame.   3 month ICM trend: 10/21/2017    1 Year ICM trend:       Karie Soda, RN 10/21/2017 12:24 PM

## 2017-10-22 LAB — WOUND CULTURE
MICRO NUMBER:: 90905387
SPECIMEN QUALITY: ADEQUATE

## 2017-11-10 ENCOUNTER — Ambulatory Visit (HOSPITAL_COMMUNITY)
Admission: RE | Admit: 2017-11-10 | Discharge: 2017-11-10 | Disposition: A | Discharge status: Home / Self Care | Payer: BC Managed Care – PPO | Source: Ambulatory Visit | Attending: Internal Medicine | Admitting: Internal Medicine

## 2017-11-10 ENCOUNTER — Encounter (HOSPITAL_COMMUNITY): Payer: Self-pay | Admitting: Internal Medicine

## 2017-11-10 ENCOUNTER — Other Ambulatory Visit: Payer: Self-pay

## 2017-11-10 VITALS — BP 98/72 | HR 58 | Wt 165.5 lb

## 2017-11-10 DIAGNOSIS — I5022 Chronic systolic (congestive) heart failure: Secondary | ICD-10-CM | POA: Insufficient documentation

## 2017-11-10 DIAGNOSIS — I2510 Atherosclerotic heart disease of native coronary artery without angina pectoris: Secondary | ICD-10-CM | POA: Insufficient documentation

## 2017-11-10 DIAGNOSIS — Z79890 Hormone replacement therapy (postmenopausal): Secondary | ICD-10-CM | POA: Diagnosis not present

## 2017-11-10 DIAGNOSIS — B182 Chronic viral hepatitis C: Secondary | ICD-10-CM

## 2017-11-10 DIAGNOSIS — Z79899 Other long term (current) drug therapy: Secondary | ICD-10-CM | POA: Diagnosis not present

## 2017-11-10 DIAGNOSIS — K219 Gastro-esophageal reflux disease without esophagitis: Secondary | ICD-10-CM | POA: Insufficient documentation

## 2017-11-10 DIAGNOSIS — Z95810 Presence of automatic (implantable) cardiac defibrillator: Secondary | ICD-10-CM

## 2017-11-10 DIAGNOSIS — Z8249 Family history of ischemic heart disease and other diseases of the circulatory system: Secondary | ICD-10-CM | POA: Diagnosis not present

## 2017-11-10 DIAGNOSIS — I10 Essential (primary) hypertension: Secondary | ICD-10-CM | POA: Diagnosis not present

## 2017-11-10 DIAGNOSIS — Z7982 Long term (current) use of aspirin: Secondary | ICD-10-CM | POA: Diagnosis not present

## 2017-11-10 DIAGNOSIS — Z87891 Personal history of nicotine dependence: Secondary | ICD-10-CM | POA: Insufficient documentation

## 2017-11-10 DIAGNOSIS — Z720 Tobacco use: Secondary | ICD-10-CM

## 2017-11-10 DIAGNOSIS — B1920 Unspecified viral hepatitis C without hepatic coma: Secondary | ICD-10-CM | POA: Insufficient documentation

## 2017-11-10 DIAGNOSIS — I110 Hypertensive heart disease with heart failure: Secondary | ICD-10-CM | POA: Diagnosis not present

## 2017-11-10 LAB — BASIC METABOLIC PANEL
ANION GAP: 5 (ref 5–15)
BUN: 46 mg/dL — ABNORMAL HIGH (ref 8–23)
CO2: 22 mmol/L (ref 22–32)
Calcium: 9 mg/dL (ref 8.9–10.3)
Chloride: 108 mmol/L (ref 98–111)
Creatinine, Ser: 1.16 mg/dL (ref 0.61–1.24)
GFR calc Af Amer: >60 mL/min (ref >60)
GFR calc non Af Amer: >60 mL/min (ref >60)
GLUCOSE: 114 mg/dL — AB (ref 70–99)
POTASSIUM: 5.5 mmol/L — AB (ref 3.5–5.1)
Sodium: 135 mmol/L (ref 135–145)

## 2017-11-10 MED ORDER — SACUBITRIL-VALSARTAN 24-26 MG PO TABS: 1.0000 | ORAL_TABLET | Freq: Two times a day (BID) | ORAL | 3 refills | Status: DC

## 2017-11-10 NOTE — Progress Notes (Signed)
Advanced Heart Failure Clinic Consult Note   Referring Physician: PCP: Dorena Bodo, PA-C PCP-Cardiologist: Dr. Allyson Sabal  Referring MD: Allyson Sabal  HPI:  Tommy Robbins is a 72 y.o. 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 Referred by Dr. Allyson Sabal for HF optimization.  Pt previously admitted 10/2016 due to VF arrest. ROSC 17 minutes. Taken emergently to cath lab with severe 3 v disease, but no options for intervention due to LAD CTO. Required IABP but ultimately improved.   Had elective ICD placement 03/24/2017 as secondary prevention.   Last seen in [facility] clinic 09/10/17 by Dr. Allyson Sabal.   He has been doing well overall since visit with Dr. Allyson Sabal. For the most part, can do his ADLs without difficulty. Does have DOE with showering "most days". His bedroom is on the second floor, and he is SOB if he has to take the steps more than once in a row. He is mostly limited from his chronic back pain. Lives with Mom, Sister, and Niece. Puts all of pills in a pill box once weekly. His niece just graduated PA school. He does, nor has he ever had chest pain. No lightheadedness or dizziness. No orthopnea/PND/or peripheral edema. Former smoker, but had > 50 pack years. Hasn't had a cigarette in 7 months. Rare alcohol use, on special occasions. IV drug use short, lived approx 50 years ago, none since. He reports using a clean syringe the "only 6-10 times" he did it. Has h/o of hepatitis C, he says he was treated for this in '98 with Interferon, and had second treatment 2006. He has not yet complete an entire treatment.   Denies palpitations, ICD firing, syncope or presyncope.   ICD interrogation: Thoracic impedence above threshold for since end of July. Pt active for upwards of 4 hrs daily. No VT/VF. Personally reviewed   Echo 10/06/17 - LVEF 35-40%, Grade 1 DD, Mild MR, Mod LAE, RV "low normal"  function, and PA peak pressure 28 mm Hg. (EF has been relatively stable since arrest in 10/2016)  LHC 10/2016 in setting of VF arrest  - Prox RCA to Mid RCA lesion, 100 %stenosed.  Ost LAD to Prox LAD lesion, 95 %stenosed.  Mid LAD lesion, 100 %stenosed.  Ost 2nd Diag lesion, 95 %stenosed.  Ost 1st Mrg to 1st Mrg lesion, 99 %stenosed.  2nd Mrg lesion, 99 %stenosed.  Ost Cx to Prox Cx lesion, 60 %stenosed.  Mid Cx to Dist Cx lesion, 99 %stenosed.  The left ventricular ejection fraction is 35-45% by visual estimate.  There is moderate to severe left ventricular systolic dysfunction.  LV end diastolic pressure is low.  Review of Systems: [y] = yes, [ ]  = no   General: Weight gain [ ] ; Weight loss [ ] ; Anorexia [ ] ; Fatigue [y]; Fever [ ] ; Chills [ ] ; Weakness [ ]   Cardiac: Chest pain/pressure [ ] ; Resting SOB [ ] ; Exertional SOB [y]; Orthopnea [ ] ; Pedal Edema [ ] ; Palpitations [ ] ; Syncope [ ] ; Presyncope [ ] ; Paroxysmal nocturnal dyspnea[ ]   Pulmonary: Cough [ ] ; Wheezing[ ] ; Hemoptysis[ ] ; Sputum [ ] ; Snoring [ ]   GI: Vomiting[ ] ; Dysphagia[ ] ; Melena[ ] ; Hematochezia [ ] ; Heartburn[ ] ; Abdominal pain [ ] ; Constipation [ ] ; Diarrhea [ ] ; BRBPR [ ]   GU: Hematuria[ ] ; Dysuria [ ] ; Nocturia[ ]   Vascular: Pain in legs with walking [ ] ;  Pain in feet with lying flat [ ] ; Non-healing sores [ ] ; Stroke [ ] ; TIA [ ] ; Slurred speech [ ] ;  Neuro: Headaches[ ] ; Vertigo[ ] ; Seizures[ ] ; Paresthesias[ ] ;Blurred vision [ ] ; Diplopia [ ] ; Vision changes [ ]   Ortho/Skin: Arthritis [y]; Joint pain [y]; Muscle pain [ ] ; Joint swelling [y]; Back Pain [y]; Rash [ ]   Psych: Depression[y]; Anxiety[ ]   Heme: Bleeding problems [ ] ; Clotting disorders [ ] ; Anemia [ ]   Endocrine: Diabetes [ ] ; Thyroid dysfunction[ ]    Past Medical History:  Diagnosis Date  . AICD (automatic cardioverter/defibrillator) present 03/24/2017  . Cataract   . Cellulitis and abscess of right leg 10/06/2016   right thigh  .  Chronic low back pain   . Colon polyps   . Depression   . Diverticulosis   . GERD (gastroesophageal reflux disease)   . Hepatitis C   . History of sudden cardiac arrest 10/25/2016   witness/notes 02/10/2017  . Hx of substance abuse    /notes 02/10/2017  . Hypertension   . Insomnia   . Thyroid disease    hypothyroid    Current Outpatient Medications  Medication Sig Dispense Refill  . aspirin EC 81 MG tablet Take 81 mg by mouth daily.    Marland Kitchen atorvastatin (LIPITOR) 80 MG tablet TAKE 1 TABLET DAILY AT 6PM 90 tablet 2  . clopidogrel (PLAVIX) 75 MG tablet Take 1 tablet (75 mg total) by mouth daily. Resume 03/27/17 90 tablet 2  . cycloSPORINE (RESTASIS) 0.05 % ophthalmic emulsion Place 1 drop into both eyes 2 (two) times daily.    . diclofenac sodium (VOLTAREN) 1 % GEL Apply 1 application topically daily as needed (for pain).    . furosemide (LASIX) 20 MG tablet Take 1 tablet (20 mg total) by mouth daily as needed. 90 tablet 3  . gabapentin (NEURONTIN) 100 MG capsule Take 200 mg by mouth at bedtime.   2  . isosorbide mononitrate (IMDUR) 30 MG 24 hr tablet Take 1 tablet (30 mg total) by mouth daily. 90 tablet 3  . levothyroxine (SYNTHROID, LEVOTHROID) 88 MCG tablet Take 1 tablet (88 mcg total) by mouth daily before breakfast. 90 tablet 2  . lidocaine (LIDODERM) 5 % Use up to 3 patches on skin daily as needed for pain  4  . lisinopril (PRINIVIL,ZESTRIL) 20 MG tablet TAKE 1 TABLET DAILY 90 tablet 2  . metoprolol succinate (TOPROL-XL) 50 MG 24 hr tablet Take 1 tablet (50 mg total) by mouth daily. Take with or immediately following a meal. 90 tablet 3  . morphine (MS CONTIN) 15 MG 12 hr tablet Take 15 mg by mouth See admin instructions. Take 15 mg by mouth in the morning with 30 mg to equal 45 mg dose  0  . morphine (MS CONTIN) 30 MG 12 hr tablet Take 30 mg by mouth See admin instructions. Takes 30 mg by mouth in the morning with 15 mg to equal 45 mg dose, 30 mg in the afternoon, and 30 mg at night      . nitroGLYCERIN (NITROSTAT) 0.4 MG SL tablet Place 1 tablet (0.4 mg total) under the tongue every 5 (five) minutes as needed for chest pain. 25 tablet 3  . Nutritional Supplements (JUICE PLUS FIBRE PO) Take 2 capsules by mouth 2 (two) times daily.    . pantoprazole (PROTONIX) 40 MG tablet Take 1 tablet (40 mg total) by mouth daily. 90 tablet 2  . spironolactone (ALDACTONE) 25 MG tablet TAKE 1 TABLET  DAILY 90 tablet 2  . temazepam (RESTORIL) 15 MG capsule Take 1 capsule (15 mg total) by mouth at bedtime as needed for sleep. 45 capsule 1  . traZODone (DESYREL) 50 MG tablet Take 25 mg by mouth at bedtime.   2   No current facility-administered medications for this encounter.     Allergies  Allergen Reactions  . Other Other (See Comments)    Seasonal allergies - sniffling       Social History   Socioeconomic History  . Marital status: Divorced    Spouse name: Not on file  . Number of children: Not on file  . Years of education: Not on file  . Highest education level: Not on file  Occupational History  . Not on file  Social Needs  . Financial resource strain: Not on file  . Food insecurity:    Worry: Not on file    Inability: Not on file  . Transportation needs:    Medical: Not on file    Non-medical: Not on file  Tobacco Use  . Smoking status: Former Smoker    Years: 53.00    Types: Cigarettes    Last attempt to quit: 04/19/2017    Years since quitting: 0.5  . Smokeless tobacco: Never Used  Substance and Sexual Activity  . Alcohol use: Yes    Alcohol/week: 5.0 standard drinks    Types: 5 Cans of beer per week  . Drug use: Yes    Types: Marijuana    Comment: h/o IVDA; "no IVDAsince the 1970s; last smoked pot ~ 10/2016"  . Sexual activity: Not Currently  Lifestyle  . Physical activity:    Days per week: Not on file    Minutes per session: Not on file  . Stress: Not on file  Relationships  . Social connections:    Talks on phone: Not on file    Gets together: Not on  file    Attends religious service: Not on file    Active member of club or organization: Not on file    Attends meetings of clubs or organizations: Not on file    Relationship status: Not on file  . Intimate partner violence:    Fear of current or ex partner: Not on file    Emotionally abused: Not on file    Physically abused: Not on file    Forced sexual activity: Not on file  Other Topics Concern  . Not on file  Social History Narrative   Entered 12/2013:   Lives with his Sister, Niece, and Mother   They take turns caring for mother, who is 41   Quit smoking in 2013      Family History  Problem Relation Age of Onset  . Arthritis Father        rheumatoid  . Hypertension Sister   . Cancer Sister   . Scoliosis Sister   . Arthritis Sister   . Osteoarthritis Mother   . Arthritis/Rheumatoid Sister   . Peripheral vascular disease Sister   . Arrhythmia Sister   . CAD Brother    Vitals:   11/10/17 1132 11/10/17 1141  BP: 100/78 98/72  Pulse: (!) 58   SpO2: 100%   Weight: 75.1 kg (165 lb 8 oz)     Wt Readings from Last 3 Encounters:  11/10/17 75.1 kg (165 lb 8 oz)  10/20/17 77.9 kg (171 lb 12.8 oz)  09/10/17 77.7 kg (171 lb 6.4 oz)    PHYSICAL EXAM: General:  Elderly appearing. NAD.  HEENT: normal anicteric Neck: supple. JVP does not appear elevated. Carotids 2+ bilat; no bruits. No lymphadenopathy or thyromegaly appreciated. Cor: PMI nondisplaced. Regular rate & rhythm. No rubs, gallops or murmurs. Lungs: clear no wheeze Abdomen: soft, nontender, nondistended. No hepatosplenomegaly. No bruits or masses. Good bowel sounds. Extremities: no cyanosis, clubbing, rash, edema Neuro: alert & oriented x 3, cranial nerves grossly intact. moves all 4 extremities w/o difficulty. Affect pleasant  ECG: SB 56. Anteroseptal infarct with persistent ST elevation and TWI Personally reviewed  ASSESSMENT & PLAN:  1. Chronic systolic CHF, ICM -Echo 10/06/17 LVEF 35-40%, Grade 1 DD,  Mild MR, Mod LAE, RV "low normal" function, and PA peak pressure 28 mm Hg. (EF has been relatively stable since arrest in 10/2016) - NYHA III-IIIb symptoms, confounded by chronic back pain and deconditioning - Volume status stable on exam and optivol.  - Continue lasix 20 mg as needed only - STOP lisinopril. Will cautiously try Entresto 24/26 mg BID on Saturday 11/13/17 to allow adequate washout of ACEi.  - Continue Toprol XL 50 mg daily. - Continue spiro 25 mg daily.  - Med titration limited by soft BPs. He is not orthostatic on exam today.   2. CAD, severe, 3v - No s/s of ischemia.    - Continue ASA and statin.  - With excessive bruising and easy bleeding, will stop Plavix.  - No options for PCI or CABG (No targets for bypass) - Continue imdur 30 mg daily  3. HTN - Will adjust meds in setting of treating his systolic HF.   4. Hepatitis C - He has started treatment with interferon twice, but has never completed - He has put off treatment with his heart issues. Encouraged to follow up.   5. Chronic pain - He is on [place] term morphine for chronic back and shoulder pain. He is mostly limited from this and cant stand for longer than 5-10 minutes, even with a brace.   6. H/o substance abuse - Distant, in his teens. None since.   Meds as above. BMET today. RTC 2 weeks with Pharm-D, 2-3 months with APP. Sooner with symptoms.   Graciella Freer, PA-C 11/10/17   Patient seen and examined with the above-signed Advanced Practice Provider and/or Housestaff. I personally reviewed laboratory data, imaging studies and relevant notes. I independently examined the patient and formulated the important aspects of the plan. I have edited the note to reflect any of my changes or salient points. I have personally discussed the plan with the patient and/or family.  72 y/o male as above with ischemic CM with EF 35-40% (Echo reviewed personally). By history NYHA III-IIIB symptoms but suspect a good  part of this related to his musculoskeletal comorbidities. Gets around clinic fairly well. CAD has been non- revascularizable. On exam volume status looks good. Confirmed by ICD/ICM interrogation. No VT/VF. Will try to switch to [facility] watching BP closely. He is c/o of severe bruising so we will stop Plavix. Encouraged to f/u with ID for HCV treatment.   Will have him f/u in PharmD clinic to assess tolerance of Entresto and titrate meds further as tolerated. He is not candidate for advanced therapies.   Arvilla Meres, MD  9:14 PM

## 2017-11-10 NOTE — Patient Instructions (Signed)
Stop Plavix  Stop Lisinopril  Start Entresto 24/26 mg Twice daily STARTING 11/13/17 AM  Lab today  Please follow up with Cicero Duck, our heart failure pharmacist in 2 weeks  Your physician recommends that you schedule a follow-up appointment in: 2-3 months

## 2017-11-11 ENCOUNTER — Telehealth (HOSPITAL_COMMUNITY): Payer: Self-pay | Admitting: *Deleted

## 2017-11-11 DIAGNOSIS — I5022 Chronic systolic (congestive) heart failure: Secondary | ICD-10-CM

## 2017-11-11 MED ORDER — SPIRONOLACTONE 25 MG PO TABS: 12.5000 mg | ORAL_TABLET | Freq: Every day | ORAL | 2 refills | Status: DC

## 2017-11-11 NOTE — Telephone Encounter (Signed)
-----   Message from Dolores Patty, MD sent at 11/10/2017  9:54 PM EDT ----- Lisinopril switched to [facility] today. Cut spiro to 12.5. Repeat next week

## 2017-11-12 ENCOUNTER — Telehealth (HOSPITAL_COMMUNITY): Payer: Self-pay | Admitting: Pharmacist

## 2017-11-12 NOTE — Telephone Encounter (Signed)
Entresto PA approved by [facility] through 11/13/18.   Tyler Deis. Bonnye Fava, PharmD, BCPS, CPP Clinical Pharmacist Phone: [phone] 11/12/2017 11:42 AM

## 2017-11-18 ENCOUNTER — Ambulatory Visit (HOSPITAL_COMMUNITY)
Admission: RE | Admit: 2017-11-18 | Discharge: 2017-11-18 | Disposition: A | Discharge status: Home / Self Care | Payer: BC Managed Care – PPO | Source: Ambulatory Visit | Attending: Internal Medicine | Admitting: Internal Medicine

## 2017-11-18 DIAGNOSIS — I5022 Chronic systolic (congestive) heart failure: Secondary | ICD-10-CM

## 2017-11-18 LAB — BASIC METABOLIC PANEL
Anion gap: 8 (ref 5–15)
BUN: 18 mg/dL (ref 8–23)
CALCIUM: 8.6 mg/dL — AB (ref 8.9–10.3)
CHLORIDE: 107 mmol/L (ref 98–111)
CO2: 24 mmol/L (ref 22–32)
CREATININE: 1.05 mg/dL (ref 0.61–1.24)
GFR calc Af Amer: >60 mL/min (ref >60)
Glucose, Bld: 109 mg/dL — ABNORMAL HIGH (ref 70–99)
Potassium: 4.6 mmol/L (ref 3.5–5.1)
SODIUM: 139 mmol/L (ref 135–145)

## 2017-11-23 ENCOUNTER — Ambulatory Visit (INDEPENDENT_AMBULATORY_CARE_PROVIDER_SITE_OTHER): Payer: BC Managed Care – PPO

## 2017-11-23 DIAGNOSIS — Z95810 Presence of automatic (implantable) cardiac defibrillator: Secondary | ICD-10-CM | POA: Diagnosis not present

## 2017-11-23 DIAGNOSIS — I5022 Chronic systolic (congestive) heart failure: Secondary | ICD-10-CM

## 2017-11-23 NOTE — Progress Notes (Signed)
EPIC Encounter for ICM Monitoring  Patient Name: Tommy Robbins is a 72 y.o. male Date: 11/23/2017 Primary Care Physican: Dorena Bodo, PA-C Primary Cardiologist:Berry/Bensimhon Electrophysiologist:Allred Dry Weight:unknown      Heart Failure questions reviewed, pt asymptomatic.  Had 1st visit with Dr Gala Romney on 11/10/2017.   Thoracic impedance trending just below baseline normal.  Prescribed dosage: Furosemide20 mgtake1 tabletby mouth daily as needed for fluid.  Recommendations:  Encouraged to call for any fluid symptoms.   Follow-up plan: ICM clinic phone appointment on 11/30/2017 to recheck fluid levels.      Copy of ICM check sent to Dr. Johney Frame and Dr Gala Romney.   3 month ICM trend: 11/23/2017    1 Year ICM trend:       Karie Soda, RN 11/23/2017 12:35 PM

## 2017-11-24 ENCOUNTER — Inpatient Hospital Stay (HOSPITAL_COMMUNITY)
Admission: RE | Admit: 2017-11-24 | Discharge: 2017-11-24 | Disposition: A | Discharge status: Home / Self Care | Payer: Self-pay | Source: Ambulatory Visit

## 2017-12-04 ENCOUNTER — Other Ambulatory Visit: Payer: Self-pay | Admitting: Physician Assistant

## 2017-12-19 ENCOUNTER — Other Ambulatory Visit: Payer: Self-pay | Admitting: Physician Assistant

## 2017-12-23 ENCOUNTER — Ambulatory Visit (INDEPENDENT_AMBULATORY_CARE_PROVIDER_SITE_OTHER): Payer: BC Managed Care – PPO

## 2017-12-23 ENCOUNTER — Ambulatory Visit (INDEPENDENT_AMBULATORY_CARE_PROVIDER_SITE_OTHER): Payer: BC Managed Care – PPO | Admitting: *Deleted

## 2017-12-23 ENCOUNTER — Telehealth: Payer: Self-pay | Admitting: Cardiology

## 2017-12-23 ENCOUNTER — Other Ambulatory Visit: Payer: Self-pay | Admitting: Internal Medicine

## 2017-12-23 DIAGNOSIS — Z95810 Presence of automatic (implantable) cardiac defibrillator: Secondary | ICD-10-CM | POA: Diagnosis not present

## 2017-12-23 DIAGNOSIS — I5022 Chronic systolic (congestive) heart failure: Secondary | ICD-10-CM | POA: Diagnosis not present

## 2017-12-23 DIAGNOSIS — I255 Ischemic cardiomyopathy: Secondary | ICD-10-CM

## 2017-12-23 NOTE — Telephone Encounter (Signed)
LMOVM reminding pt to send remote transmission.   

## 2017-12-24 NOTE — Progress Notes (Signed)
EPIC Encounter for ICM Monitoring  Patient Name: Tommy Robbins is a 72 y.o. male Date: 12/24/2017 Primary Care Physican: Dorena Bodo, PA-C Primary Cardiologist:Berry/Bensimhon Electrophysiologist:Allred Dry Weight:unknown       Heart Failure questions reviewed, pt asymptomatic but does feel tired.    Thoracic impedance abnormal suggesting fluid accumulation starting 12/04/2017.  Prescribed: Furosemide20 mgtake1 tabletby mouth daily as needed for fluid.  Recommendations:  Advised to take Furosemide20 mgtake1 tabletby mouth daily x 3 days and then return to PRN  Follow-up plan: ICM clinic phone appointment on 12/30/2017 to recheck fluid levels.   Office appointment scheduled 01/11/2018 with Dr. Gala Romney.    Copy of ICM check sent to Dr. Johney Frame.   3 month ICM trend: 12/23/2017    1 Year ICM trend:       Karie Soda, RN 12/24/2017 9:39 AM

## 2017-12-24 NOTE — Progress Notes (Signed)
Remote ICD transmission.   

## 2017-12-28 ENCOUNTER — Other Ambulatory Visit: Payer: Self-pay | Admitting: *Deleted

## 2017-12-28 DIAGNOSIS — G479 Sleep disorder, unspecified: Secondary | ICD-10-CM

## 2017-12-28 NOTE — Telephone Encounter (Signed)
Received fax requesting refill on Restoril.   Ok to refill??  Last office visit 10/20/2017.  Last refill 07/02/2017, 31 refills.

## 2017-12-29 MED ORDER — TEMAZEPAM 15 MG PO CAPS: 15.0000 mg | ORAL_CAPSULE | Freq: Every evening | ORAL | 1 refills | Status: DC | PRN

## 2017-12-30 ENCOUNTER — Ambulatory Visit (INDEPENDENT_AMBULATORY_CARE_PROVIDER_SITE_OTHER): Payer: BC Managed Care – PPO

## 2017-12-30 DIAGNOSIS — I5022 Chronic systolic (congestive) heart failure: Secondary | ICD-10-CM

## 2017-12-30 DIAGNOSIS — Z95810 Presence of automatic (implantable) cardiac defibrillator: Secondary | ICD-10-CM

## 2017-12-30 NOTE — Progress Notes (Signed)
EPIC Encounter for ICM Monitoring  Patient Name: Tommy Robbins is a 72 y.o. male Date: 12/30/2017 Primary Care Physican: Dorena Bodo, PA-C Primary Cardiologist:Berry/Bensimhon Electrophysiologist:Allred Dry Weight:unknown           Heart Failure questions reviewed, pt asymptomatic but has noticed occasional shortness of breath.   Thoracic impedance improved after taking 3 days of PRN Furosemide but not at baseline.    Furosemide20 mgtake1 tabletby mouth daily as needed for fluid.  Recommendations:  Advised to take x 1 more day of the PRN Furosemide since he is close to baseline.  Follow-up plan: ICM clinic phone appointment on 01/31/2018 since patient will have office appointment scheduled 01/11/2018 with Dr. Gala Romney.    Copy of ICM check sent to Dr. Johney Frame and Dr Gala Romney.   3 month ICM trend: 12/30/2017    1 Year ICM trend:       Karie Soda, RN 12/30/2017 12:51 PM

## 2018-01-01 LAB — CUP PACEART REMOTE DEVICE CHECK
Brady Statistic AP VP Percent: 0.03 %
Brady Statistic AP VS Percent: 26.66 %
Brady Statistic AS VP Percent: 0.03 %
Brady Statistic RA Percent Paced: 25.6 %
HighPow Impedance: 67 Ohm
Implantable Lead Implant Date: 20190102
Implantable Lead Location: 753859
Implantable Lead Model: 5076
Lead Channel Pacing Threshold Amplitude: 0.75 V
Lead Channel Sensing Intrinsic Amplitude: 12.5 mV
Lead Channel Sensing Intrinsic Amplitude: 12.5 mV
Lead Channel Sensing Intrinsic Amplitude: 3.125 mV
Lead Channel Sensing Intrinsic Amplitude: 3.125 mV
Lead Channel Setting Pacing Amplitude: 2 V
Lead Channel Setting Pacing Amplitude: 2.5 V
Lead Channel Setting Pacing Pulse Width: 0.4 ms
MDC IDC LEAD IMPLANT DT: 20190102
MDC IDC LEAD LOCATION: 753860
MDC IDC MSMT BATTERY REMAINING LONGEVITY: 127 mo
MDC IDC MSMT BATTERY VOLTAGE: 3.01 V
MDC IDC MSMT LEADCHNL RA IMPEDANCE VALUE: 456 Ohm
MDC IDC MSMT LEADCHNL RA PACING THRESHOLD AMPLITUDE: 0.5 V
MDC IDC MSMT LEADCHNL RA PACING THRESHOLD PULSEWIDTH: 0.4 ms
MDC IDC MSMT LEADCHNL RV IMPEDANCE VALUE: 323 Ohm
MDC IDC MSMT LEADCHNL RV IMPEDANCE VALUE: 380 Ohm
MDC IDC MSMT LEADCHNL RV PACING THRESHOLD PULSEWIDTH: 0.4 ms
MDC IDC PG IMPLANT DT: 20190102
MDC IDC SESS DTM: 20191003203827
MDC IDC SET LEADCHNL RV SENSING SENSITIVITY: 0.3 mV
MDC IDC STAT BRADY AS VS PERCENT: 73.27 %
MDC IDC STAT BRADY RV PERCENT PACED: 0.06 %

## 2018-01-11 ENCOUNTER — Ambulatory Visit (HOSPITAL_COMMUNITY)
Admission: RE | Admit: 2018-01-11 | Discharge: 2018-01-11 | Disposition: A | Discharge status: Home / Self Care | Payer: BC Managed Care – PPO | Source: Ambulatory Visit | Attending: Cardiology | Admitting: Cardiology

## 2018-01-11 ENCOUNTER — Encounter (HOSPITAL_COMMUNITY): Payer: Self-pay

## 2018-01-11 VITALS — BP 108/60 | HR 56 | Wt 173.4 lb

## 2018-01-11 DIAGNOSIS — I10 Essential (primary) hypertension: Secondary | ICD-10-CM

## 2018-01-11 DIAGNOSIS — I255 Ischemic cardiomyopathy: Secondary | ICD-10-CM | POA: Diagnosis not present

## 2018-01-11 DIAGNOSIS — K219 Gastro-esophageal reflux disease without esophagitis: Secondary | ICD-10-CM | POA: Insufficient documentation

## 2018-01-11 DIAGNOSIS — Z79899 Other long term (current) drug therapy: Secondary | ICD-10-CM | POA: Diagnosis not present

## 2018-01-11 DIAGNOSIS — I2510 Atherosclerotic heart disease of native coronary artery without angina pectoris: Secondary | ICD-10-CM | POA: Diagnosis not present

## 2018-01-11 DIAGNOSIS — I110 Hypertensive heart disease with heart failure: Secondary | ICD-10-CM | POA: Insufficient documentation

## 2018-01-11 DIAGNOSIS — G8929 Other chronic pain: Secondary | ICD-10-CM | POA: Insufficient documentation

## 2018-01-11 DIAGNOSIS — Z8674 Personal history of sudden cardiac arrest: Secondary | ICD-10-CM | POA: Diagnosis not present

## 2018-01-11 DIAGNOSIS — I5022 Chronic systolic (congestive) heart failure: Secondary | ICD-10-CM

## 2018-01-11 DIAGNOSIS — B1920 Unspecified viral hepatitis C without hepatic coma: Secondary | ICD-10-CM | POA: Insufficient documentation

## 2018-01-11 DIAGNOSIS — Z8249 Family history of ischemic heart disease and other diseases of the circulatory system: Secondary | ICD-10-CM | POA: Insufficient documentation

## 2018-01-11 DIAGNOSIS — R0602 Shortness of breath: Secondary | ICD-10-CM | POA: Diagnosis present

## 2018-01-11 DIAGNOSIS — Z7982 Long term (current) use of aspirin: Secondary | ICD-10-CM | POA: Insufficient documentation

## 2018-01-11 DIAGNOSIS — Z79890 Hormone replacement therapy (postmenopausal): Secondary | ICD-10-CM | POA: Insufficient documentation

## 2018-01-11 DIAGNOSIS — M549 Dorsalgia, unspecified: Secondary | ICD-10-CM | POA: Diagnosis present

## 2018-01-11 DIAGNOSIS — Z87891 Personal history of nicotine dependence: Secondary | ICD-10-CM | POA: Insufficient documentation

## 2018-01-11 NOTE — Patient Instructions (Signed)
It was great to see you today! No medication changes are needed at this time.  Your physician recommends that you schedule a follow-up appointment in: 3-4 months  Do the following things EVERYDAY: 1) Weigh yourself in the morning before breakfast. Write it down and keep it in a log. 2) Take your medicines as prescribed 3) Eat low salt foods-Limit salt (sodium) to 2000 mg per day.  4) Stay as active as you can everyday 5) Limit all fluids for the day to less than 2 liters

## 2018-01-11 NOTE — Progress Notes (Signed)
[facility]  PCP: Tommy Bodo, PA-C PCP-Cardiologist: Dr. Allyson Robbins   HPI: Tommy Robbins is a 72 y.o. 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 Marland Kitchen   Pt previously admitted 10/2016 due to VF arrest. ROSC 17 minutes. Taken emergently to cath lab with severe 3 v disease, but no options for intervention due to LAD CTO. Required IABP but ultimately improved.   Had elective ICD placement 03/24/2017 as secondary prevention.   Last seen in [facility] clinic 09/10/17 by Dr. Allyson Robbins.   Today he returns for HF follow up. Last visit he was started on entresto 24-26 mg twice a day. Overall feeling fine. Occasionally dizzy. SOB with exertion but this has been his baseline. Ambulation is limited by back pain. He takes chronic pain medication. Denies PND/Orthopnea. No chest pain. Appetite ok. No fever or chills. Weight at home 169-170 pounds. Taking all medications.   Echo 10/06/17 - LVEF 35-40%, Grade 1 DD, Mild MR, Mod LAE, RV "low normal" function, and PA peak pressure 28 mm Hg. (EF has been relatively stable since arrest in 10/2016)  LHC 10/2016 in setting of VF arrest  - Prox RCA to Mid RCA lesion, 100 %stenosed.  Ost LAD to Prox LAD lesion, 95 %stenosed.  Mid LAD lesion, 100 %stenosed.  Ost 2nd Diag lesion, 95 %stenosed.  Ost 1st Mrg to 1st Mrg lesion, 99 %stenosed.  2nd Mrg lesion, 99 %stenosed.  Ost Cx to Prox Cx lesion, 60 %stenosed.  Mid Cx to Dist Cx lesion, 99 %stenosed.  The left ventricular ejection fraction is 35-45% by visual estimate.  There is moderate to severe left ventricular systolic dysfunction.  LV end diastolic pressure is low.   Past Medical History:  Diagnosis Date  . AICD (automatic cardioverter/defibrillator) present 03/24/2017  . Cataract   . Cellulitis and abscess of right leg 10/06/2016   right thigh  .  Chronic low back pain   . Colon polyps   . Depression   . Diverticulosis   . GERD (gastroesophageal reflux disease)   . Hepatitis C   . History of sudden cardiac arrest 10/25/2016   witness/notes 02/10/2017  . Hx of substance abuse (HCC)    Tommy Robbins 02/10/2017  . Hypertension   . Insomnia   . Thyroid disease    hypothyroid    Current Outpatient Medications  Medication Sig Dispense Refill  . aspirin EC 81 MG tablet Take 81 mg by mouth daily.    Marland Kitchen atorvastatin (LIPITOR) 80 MG tablet TAKE 1 TABLET DAILY AT 6PM 90 tablet 2  . cycloSPORINE (RESTASIS) 0.05 % ophthalmic emulsion Place 1 drop into both eyes 2 (two) times daily.    . diclofenac sodium (VOLTAREN) 1 % GEL Apply 1 application topically daily as needed (for pain).    . furosemide (LASIX) 20 MG tablet Take 1 tablet (20 mg total) by mouth daily as needed. 90 tablet 3  . gabapentin (NEURONTIN) 100 MG capsule Take 200 mg by mouth at bedtime.   2  . isosorbide mononitrate (IMDUR) 30 MG 24 hr tablet TAKE 1 TABLET BY MOUTH EVERY DAY 90 tablet 3  . levothyroxine (SYNTHROID, LEVOTHROID) 88 MCG tablet Take 1 tablet (88 mcg total) by mouth daily before breakfast. 90 tablet 2  . lidocaine (LIDODERM) 5 % Use up to 3  patches on skin daily as needed for pain  4  . metoprolol succinate (TOPROL-XL) 50 MG 24 hr tablet Take one tablet daily with food 90 tablet 3  . morphine (MS CONTIN) 15 MG 12 hr tablet Take 15 mg by mouth See admin instructions. Take 15 mg by mouth in the morning with 30 mg to equal 45 mg dose  0  . morphine (MS CONTIN) 30 MG 12 hr tablet Take 30 mg by mouth See admin instructions. Takes 30 mg by mouth in the morning with 15 mg to equal 45 mg dose, 30 mg in the afternoon, and 30 mg at night    . nitroGLYCERIN (NITROSTAT) 0.4 MG SL tablet Place 1 tablet (0.4 mg total) under the tongue every 5 (five) minutes as needed for chest pain. 25 tablet 3  . Nutritional Supplements (JUICE PLUS FIBRE PO) Take 2 capsules by mouth 2 (two) times  daily.    . pantoprazole (PROTONIX) 40 MG tablet Take 1 tablet (40 mg total) by mouth daily. 90 tablet 2  . sacubitril-valsartan (ENTRESTO) 24-26 MG Take 1 tablet by mouth 2 (two) times daily. 60 tablet 3  . spironolactone (ALDACTONE) 25 MG tablet Take 0.5 tablets (12.5 mg total) by mouth daily. 45 tablet 2  . temazepam (RESTORIL) 15 MG capsule Take 1 capsule (15 mg total) by mouth at bedtime as needed for sleep. 45 capsule 1  . traZODone (DESYREL) 50 MG tablet Take 25 mg by mouth at bedtime.   2   No current facility-administered medications for this encounter.     Allergies  Allergen Reactions  . Other Other (See Comments)    Seasonal allergies - sniffling       Social History   Socioeconomic History  . Marital status: Divorced    Spouse name: Not on file  . Number of children: Not on file  . Years of education: Not on file  . Highest education level: Not on file  Occupational History  . Not on file  Social Needs  . Financial resource strain: Not on file  . Food insecurity:    Worry: Not on file    Inability: Not on file  . Transportation needs:    Medical: Not on file    Non-medical: Not on file  Tobacco Use  . Smoking status: Former Smoker    Years: 53.00    Types: Cigarettes    Last attempt to quit: 04/19/2017    Years since quitting: 0.7  . Smokeless tobacco: Never Used  Substance and Sexual Activity  . Alcohol use: Yes    Alcohol/week: 5.0 standard drinks    Types: 5 Cans of beer per week  . Drug use: Yes    Types: Marijuana    Comment: h/o IVDA; "no IVDAsince the 1970s; last smoked pot ~ 10/2016"  . Sexual activity: Not Currently  Lifestyle  . Physical activity:    Days per week: Not on file    Minutes per session: Not on file  . Stress: Not on file  Relationships  . Social connections:    Talks on phone: Not on file    Gets together: Not on file    Attends religious service: Not on file    Active member of club or organization: Not on file     Attends meetings of clubs or organizations: Not on file    Relationship status: Not on file  . Intimate partner violence:    Fear of current or ex partner: Not on  file    Emotionally abused: Not on file    Physically abused: Not on file    Forced sexual activity: Not on file  Other Topics Concern  . Not on file  Social History Narrative   Entered 12/2013:   Lives with his Sister, Niece, and Mother   They take turns caring for mother, who is 47   Quit smoking in 2013      Family History  Problem Relation Age of Onset  . Arthritis Father        rheumatoid  . Hypertension Sister   . Cancer Sister   . Scoliosis Sister   . Arthritis Sister   . Osteoarthritis Mother   . Arthritis/Rheumatoid Sister   . Peripheral vascular disease Sister   . Arrhythmia Sister   . CAD Brother    Vitals:   01/11/18 1324  BP: 108/60  Pulse: (!) 56  SpO2: 97%  Weight: 78.7 kg (173 lb 6.4 oz)    Wt Readings from Last 3 Encounters:  01/11/18 78.7 kg (173 lb 6.4 oz)  11/10/17 75.1 kg (165 lb 8 oz)  10/20/17 77.9 kg (171 lb 12.8 oz)    PHYSICAL EXAM: General:  Well appearing. No resp difficulty HEENT: normal Neck: supple. no JVD. Carotids 2+ bilat; no bruits. No lymphadenopathy or thryomegaly appreciated. Cor: PMI nondisplaced. Regular rate & rhythm. No rubs, gallops or murmurs. Lungs: clear Abdomen: soft, nontender, nondistended. No hepatosplenomegaly. No bruits or masses. Good bowel sounds. Extremities: no cyanosis, clubbing, rash, edema Neuro: alert & orientedx3, cranial nerves grossly intact. moves all 4 extremities w/o difficulty. Affect pleasant  ASSESSMENT & PLAN:  1. Chronic systolic CHF, ICM -Echo 10/06/17 LVEF 35-40%, Grade 1 DD, Mild MR, Mod LAE, RV "low normal" function, and PA peak pressure 28 mm Hg. (EF has been relatively stable since arrest in 10/2016) - NYHA III-IIIb but seems to be limited from his back pain.   -Volume status stable.  Continue lasix 20 mg as needed only --  Continue  Entresto 24/26 mg BID.  - Continue Toprol XL 50 mg daily. Heart rate 56 no room to increase.  - Continue spiro 25 mg daily.  -Recent lab work stable.   2. CAD, severe, 3v - No s/s ischemia.     - Continue ASA and statin.  - No options for PCI or CABG (No targets for bypass) - Continue imdur 30 mg daily 3. HTN -Stable.    4. Hepatitis C - He has started treatment with interferon twice, but has never completed - He has put off treatment with his heart issues. Encouraged to follow up.   5. Chronic pain - He is on Trang term morphine for chronic back and shoulder pain. He is mostly limited from this and cant stand for longer than 5-10 minutes, even with a brace.   6. H/o substance abuse - Distant, in his teens. None since.   7. Former Smoker Quit 1 year ago.   Follow up in 3-4 months.     Tonye Becket, NP 01/11/18

## 2018-01-12 ENCOUNTER — Encounter (HOSPITAL_COMMUNITY): Payer: Self-pay

## 2018-01-12 NOTE — Addendum Note (Signed)
Encounter addended by: Burna Sis, LCSW on: 01/12/2018 10:12 AM  Actions taken: Sign clinical note

## 2018-01-12 NOTE — Addendum Note (Signed)
Encounter addended by: Burna Sis, LCSW on: 01/12/2018 7:48 AM  Actions taken: Visit Navigator Flowsheet section accepted

## 2018-01-12 NOTE — Progress Notes (Signed)
Patient completed SDOH screening at clinic appointment- no concerns identified at this time  CSW will continue to follow in clinic  Burna Sis, LCSW Clinical Social Worker [facility] [phone]

## 2018-01-22 ENCOUNTER — Other Ambulatory Visit: Payer: Self-pay | Admitting: Family Medicine

## 2018-01-31 ENCOUNTER — Ambulatory Visit (INDEPENDENT_AMBULATORY_CARE_PROVIDER_SITE_OTHER): Payer: BC Managed Care – PPO

## 2018-01-31 DIAGNOSIS — Z95810 Presence of automatic (implantable) cardiac defibrillator: Secondary | ICD-10-CM

## 2018-01-31 DIAGNOSIS — I5022 Chronic systolic (congestive) heart failure: Secondary | ICD-10-CM

## 2018-02-01 NOTE — Progress Notes (Signed)
EPIC Encounter for ICM Monitoring  Patient Name: Tommy Robbins is a 72 y.o. male Date: 02/01/2018 Primary Care Physican: Orlena Sheldon, PA-C Primary Cardiologist:Berry/Bensimhon Electrophysiologist:Allred Last Weight: 168 lb Today's Weight: 171 lbs        Heart Failure questions reviewed, pt symptomatic with weight gain of 3 lbs.  He has unknowingly been eating foods such as cheese that are high salt.  He thought he was limiting as instructed.   Thoracic impedance abnormal suggesting fluid accumulation starting 12/04/2017.   Prescribed: Furosemide20 mgtake1 tabletby mouth daily as needed for fluid.  Labs: 11/18/2017 Creatinine 1.05, BUN 18, Potassium 4.6, Sodium 139, eGFR >60 11/10/2017 Creatinine 1.16, BUN 46, Potassium 5.5, Sodium 135, eGFR >60  03/24/2017 Creatinine 0.82, BUN 12, Potassium 4.8, Sodium 136, eGFR >60  A complete set of results can be found in Results Review.  Recommendations: Advised to take PRN Furosemide 20 mg 1 tablet x 3 days and then back to PRN.  Follow-up plan: ICM clinic phone appointment on 02/04/2018 to recheck fluid levels.    Copy of ICM check sent to Dr. Rayann Heman.   3 month ICM trend: 01/31/2018    1 Year ICM trend:       Rosalene Billings, RN 02/01/2018 11:52 AM

## 2018-02-04 ENCOUNTER — Ambulatory Visit (INDEPENDENT_AMBULATORY_CARE_PROVIDER_SITE_OTHER): Payer: BC Managed Care – PPO

## 2018-02-04 DIAGNOSIS — Z95810 Presence of automatic (implantable) cardiac defibrillator: Secondary | ICD-10-CM

## 2018-02-04 DIAGNOSIS — I5022 Chronic systolic (congestive) heart failure: Secondary | ICD-10-CM

## 2018-02-04 NOTE — Progress Notes (Signed)
EPIC Encounter for ICM Monitoring  Patient Name: Tommy Robbins is a 72 y.o. male Date: 02/04/2018 Primary Care Physican: Orlena Sheldon, PA-C Primary Cardiologist:Berry/Bensimhon Electrophysiologist:Allred Last Weight: 168 lb Today's Weight: 170.6 lbs                                                    Heart Failure questions reviewed, pt weight dropped one pound.     Thoracic impedance returned normal after taking extra Furosemide.   Prescribed: Furosemide20 mgtake1 tabletby mouth daily as needed for fluid.  Labs: 11/18/2017 Creatinine 1.05, BUN 18, Potassium 4.6, Sodium 139, eGFR >60 11/10/2017 Creatinine 1.16, BUN 46, Potassium 5.5, Sodium 135, eGFR >60  03/24/2017 Creatinine 0.82, BUN 12, Potassium 4.8, Sodium 136, eGFR >60  A complete set of results can be found in Results Review.  Recommendations: Advised to continue to limit salt intake.    Follow-up plan: ICM clinic phone appointment on 03/03/2018.   Copy of ICM check sent to Dr. Rayann Heman.   3 month ICM trend: 02/04/2018    1 Year ICM trend:       Rosalene Billings, RN 02/04/2018 6:02 PM

## 2018-02-23 ENCOUNTER — Other Ambulatory Visit (HOSPITAL_COMMUNITY): Payer: Self-pay | Admitting: Internal Medicine

## 2018-03-03 ENCOUNTER — Ambulatory Visit (INDEPENDENT_AMBULATORY_CARE_PROVIDER_SITE_OTHER): Payer: BC Managed Care – PPO

## 2018-03-03 DIAGNOSIS — I5022 Chronic systolic (congestive) heart failure: Secondary | ICD-10-CM

## 2018-03-03 DIAGNOSIS — Z95810 Presence of automatic (implantable) cardiac defibrillator: Secondary | ICD-10-CM | POA: Diagnosis not present

## 2018-03-04 NOTE — Progress Notes (Signed)
EPIC Encounter for ICM Monitoring  Patient Name: Tommy Robbins is a 72 y.o. male Date: 03/04/2018 Primary Care Physican: Orlena Sheldon, PA-C Primary Cardiologist:Berry/Bensimhon Electrophysiologist:Allred Last Weight:170 lb Today's Weight:unknown   Transmission reviewed   Thoracic impedance returned normalafter taking extra Furosemide.   Prescribed:Furosemide20 mgtake1 tabletby mouth daily as needed for fluid.  Labs: 11/18/2017 Creatinine1.05, BUN18, Potassium4.6, Sodium139, eGFR>60 11/10/2017 Creatinine1.16, BUN46, Potassium5.5, Sodium135, eGFR>60  03/24/2017 Creatinine0.82, BUN12, Potassium4.8, Sodium136, eGFR>60 A complete set of results can be found in Results Review.  Recommendations: None  Follow-up plan: ICM clinic phone appointment on1/20/2020.  Copy of ICM check sent to Dr.Allred.   3 month ICM trend: 03/03/2018    1 Year ICM trend:       Rosalene Billings, RN 03/04/2018 12:49 PM

## 2018-03-24 ENCOUNTER — Ambulatory Visit (INDEPENDENT_AMBULATORY_CARE_PROVIDER_SITE_OTHER): Payer: BC Managed Care – PPO

## 2018-03-24 DIAGNOSIS — I5022 Chronic systolic (congestive) heart failure: Secondary | ICD-10-CM

## 2018-03-24 DIAGNOSIS — I255 Ischemic cardiomyopathy: Secondary | ICD-10-CM

## 2018-03-24 LAB — CUP PACEART REMOTE DEVICE CHECK
Battery Remaining Longevity: 124 mo
Battery Voltage: 3.01 V
Brady Statistic AP VS Percent: 9.38 %
Brady Statistic AS VP Percent: 0.04 %
Brady Statistic AS VS Percent: 90.57 %
Brady Statistic RV Percent Paced: 0.05 %
HighPow Impedance: 63 Ohm
Implantable Lead Implant Date: 20190102
Implantable Lead Location: 753859
Implantable Pulse Generator Implant Date: 20190102
Lead Channel Impedance Value: 456 Ohm
Lead Channel Pacing Threshold Amplitude: 0.375 V
Lead Channel Pacing Threshold Amplitude: 0.875 V
Lead Channel Pacing Threshold Pulse Width: 0.4 ms
Lead Channel Pacing Threshold Pulse Width: 0.4 ms
Lead Channel Sensing Intrinsic Amplitude: 2.625 mV
Lead Channel Sensing Intrinsic Amplitude: 2.625 mV
Lead Channel Setting Pacing Amplitude: 2.5 V
MDC IDC LEAD IMPLANT DT: 20190102
MDC IDC LEAD LOCATION: 753860
MDC IDC MSMT LEADCHNL RV IMPEDANCE VALUE: 285 Ohm
MDC IDC MSMT LEADCHNL RV IMPEDANCE VALUE: 342 Ohm
MDC IDC MSMT LEADCHNL RV SENSING INTR AMPL: 11.875 mV
MDC IDC MSMT LEADCHNL RV SENSING INTR AMPL: 11.875 mV
MDC IDC SESS DTM: 20200102154233
MDC IDC SET LEADCHNL RA PACING AMPLITUDE: 2 V
MDC IDC SET LEADCHNL RV PACING PULSEWIDTH: 0.4 ms
MDC IDC SET LEADCHNL RV SENSING SENSITIVITY: 0.3 mV
MDC IDC STAT BRADY AP VP PERCENT: 0.01 %
MDC IDC STAT BRADY RA PERCENT PACED: 9.19 %

## 2018-03-24 NOTE — Progress Notes (Signed)
Remote ICD transmission.   

## 2018-04-03 ENCOUNTER — Other Ambulatory Visit: Payer: Self-pay | Admitting: Family Medicine

## 2018-04-09 ENCOUNTER — Other Ambulatory Visit: Payer: Self-pay

## 2018-04-09 ENCOUNTER — Emergency Department (HOSPITAL_COMMUNITY): Payer: BC Managed Care – PPO

## 2018-04-09 ENCOUNTER — Encounter (HOSPITAL_COMMUNITY): Payer: Self-pay | Admitting: Emergency Medicine

## 2018-04-09 ENCOUNTER — Inpatient Hospital Stay (HOSPITAL_COMMUNITY)
Admission: EM | Admit: 2018-04-09 | Discharge: 2018-04-11 | DRG: 445 | Disposition: A | Discharge status: Home / Self Care | Payer: BC Managed Care – PPO | Attending: Internal Medicine | Admitting: Internal Medicine

## 2018-04-09 DIAGNOSIS — K7460 Unspecified cirrhosis of liver: Secondary | ICD-10-CM | POA: Diagnosis present

## 2018-04-09 DIAGNOSIS — Z87891 Personal history of nicotine dependence: Secondary | ICD-10-CM

## 2018-04-09 DIAGNOSIS — I255 Ischemic cardiomyopathy: Secondary | ICD-10-CM | POA: Diagnosis present

## 2018-04-09 DIAGNOSIS — M545 Low back pain: Secondary | ICD-10-CM | POA: Diagnosis present

## 2018-04-09 DIAGNOSIS — I5022 Chronic systolic (congestive) heart failure: Secondary | ICD-10-CM | POA: Diagnosis not present

## 2018-04-09 DIAGNOSIS — D649 Anemia, unspecified: Secondary | ICD-10-CM | POA: Diagnosis present

## 2018-04-09 DIAGNOSIS — J9811 Atelectasis: Secondary | ICD-10-CM | POA: Diagnosis present

## 2018-04-09 DIAGNOSIS — I252 Old myocardial infarction: Secondary | ICD-10-CM | POA: Diagnosis not present

## 2018-04-09 DIAGNOSIS — R110 Nausea: Secondary | ICD-10-CM | POA: Diagnosis not present

## 2018-04-09 DIAGNOSIS — Z79891 Long term (current) use of opiate analgesic: Secondary | ICD-10-CM

## 2018-04-09 DIAGNOSIS — G8929 Other chronic pain: Secondary | ICD-10-CM | POA: Diagnosis present

## 2018-04-09 DIAGNOSIS — K828 Other specified diseases of gallbladder: Secondary | ICD-10-CM | POA: Diagnosis not present

## 2018-04-09 DIAGNOSIS — Z79890 Hormone replacement therapy (postmenopausal): Secondary | ICD-10-CM

## 2018-04-09 DIAGNOSIS — I5042 Chronic combined systolic (congestive) and diastolic (congestive) heart failure: Secondary | ICD-10-CM | POA: Diagnosis present

## 2018-04-09 DIAGNOSIS — D696 Thrombocytopenia, unspecified: Secondary | ICD-10-CM | POA: Diagnosis present

## 2018-04-09 DIAGNOSIS — R109 Unspecified abdominal pain: Secondary | ICD-10-CM | POA: Diagnosis present

## 2018-04-09 DIAGNOSIS — K819 Cholecystitis, unspecified: Secondary | ICD-10-CM | POA: Diagnosis present

## 2018-04-09 DIAGNOSIS — B182 Chronic viral hepatitis C: Secondary | ICD-10-CM | POA: Diagnosis present

## 2018-04-09 DIAGNOSIS — I110 Hypertensive heart disease with heart failure: Secondary | ICD-10-CM | POA: Diagnosis present

## 2018-04-09 DIAGNOSIS — K219 Gastro-esophageal reflux disease without esophagitis: Secondary | ICD-10-CM | POA: Diagnosis present

## 2018-04-09 DIAGNOSIS — Z7982 Long term (current) use of aspirin: Secondary | ICD-10-CM

## 2018-04-09 DIAGNOSIS — F329 Major depressive disorder, single episode, unspecified: Secondary | ICD-10-CM

## 2018-04-09 DIAGNOSIS — R1011 Right upper quadrant pain: Secondary | ICD-10-CM | POA: Diagnosis not present

## 2018-04-09 DIAGNOSIS — Z95810 Presence of automatic (implantable) cardiac defibrillator: Secondary | ICD-10-CM

## 2018-04-09 DIAGNOSIS — I2510 Atherosclerotic heart disease of native coronary artery without angina pectoris: Secondary | ICD-10-CM | POA: Diagnosis not present

## 2018-04-09 DIAGNOSIS — E039 Hypothyroidism, unspecified: Secondary | ICD-10-CM | POA: Diagnosis present

## 2018-04-09 DIAGNOSIS — Z79899 Other long term (current) drug therapy: Secondary | ICD-10-CM | POA: Diagnosis not present

## 2018-04-09 DIAGNOSIS — K5730 Diverticulosis of large intestine without perforation or abscess without bleeding: Secondary | ICD-10-CM | POA: Diagnosis not present

## 2018-04-09 DIAGNOSIS — Z96611 Presence of right artificial shoulder joint: Secondary | ICD-10-CM | POA: Diagnosis present

## 2018-04-09 DIAGNOSIS — Z01818 Encounter for other preprocedural examination: Secondary | ICD-10-CM | POA: Diagnosis not present

## 2018-04-09 DIAGNOSIS — Z791 Long term (current) use of non-steroidal anti-inflammatories (NSAID): Secondary | ICD-10-CM | POA: Diagnosis not present

## 2018-04-09 DIAGNOSIS — E785 Hyperlipidemia, unspecified: Secondary | ICD-10-CM | POA: Diagnosis present

## 2018-04-09 LAB — COMPREHENSIVE METABOLIC PANEL
ALK PHOS: 82 U/L (ref 38–126)
ALT: 41 U/L (ref 0–44)
ANION GAP: 11 (ref 5–15)
AST: 45 U/L — AB (ref 15–41)
Albumin: 3.7 g/dL (ref 3.5–5.0)
BILIRUBIN TOTAL: 0.7 mg/dL (ref 0.3–1.2)
BUN: 19 mg/dL (ref 8–23)
CALCIUM: 9 mg/dL (ref 8.9–10.3)
CO2: 23 mmol/L (ref 22–32)
Chloride: 102 mmol/L (ref 98–111)
Creatinine, Ser: 1.14 mg/dL (ref 0.61–1.24)
GFR calc Af Amer: >60 mL/min (ref >60)
Glucose, Bld: 151 mg/dL — ABNORMAL HIGH (ref 70–99)
POTASSIUM: 4.1 mmol/L (ref 3.5–5.1)
Sodium: 136 mmol/L (ref 135–145)
TOTAL PROTEIN: 6.3 g/dL — AB (ref 6.5–8.1)

## 2018-04-09 LAB — CBC WITH DIFFERENTIAL/PLATELET
ABS IMMATURE GRANULOCYTES: 0.02 10*3/uL (ref 0.00–0.07)
BASOS PCT: 0 %
Basophils Absolute: 0 10*3/uL (ref 0.0–0.1)
EOS ABS: 0.1 10*3/uL (ref 0.0–0.5)
Eosinophils Relative: 2 %
HCT: 43.4 % (ref 39.0–52.0)
Hemoglobin: 14.6 g/dL (ref 13.0–17.0)
Immature Granulocytes: 0 %
Lymphocytes Relative: 9 %
Lymphs Abs: 0.5 10*3/uL — ABNORMAL LOW (ref 0.7–4.0)
MCH: 31.1 pg (ref 26.0–34.0)
MCHC: 33.6 g/dL (ref 30.0–36.0)
MCV: 92.3 fL (ref 80.0–100.0)
MONO ABS: 0.5 10*3/uL (ref 0.1–1.0)
Monocytes Relative: 10 %
NEUTROS ABS: 4.2 10*3/uL (ref 1.7–7.7)
Neutrophils Relative %: 79 %
PLATELETS: 120 10*3/uL — AB (ref 150–400)
RBC: 4.7 MIL/uL (ref 4.22–5.81)
RDW: 12.7 % (ref 11.5–15.5)
WBC: 5.4 10*3/uL (ref 4.0–10.5)
nRBC: 0 % (ref 0.0–0.2)

## 2018-04-09 LAB — I-STAT TROPONIN, ED: Troponin i, poc: 0.01 ng/mL (ref 0.00–0.08)

## 2018-04-09 LAB — D-DIMER, QUANTITATIVE: D-Dimer, Quant: 0.72 ug/mL-FEU — ABNORMAL HIGH (ref 0.00–0.50)

## 2018-04-09 MED ORDER — NITROGLYCERIN 0.4 MG SL SUBL: 0.4000 mg | SUBLINGUAL_TABLET | SUBLINGUAL | Status: DC | PRN

## 2018-04-09 MED ORDER — MORPHINE SULFATE ER 15 MG PO TBCR: 15.0000 mg | EXTENDED_RELEASE_TABLET | ORAL | Status: DC

## 2018-04-09 MED ORDER — ISOSORBIDE MONONITRATE ER 30 MG PO TB24
30.0000 mg | ORAL_TABLET | Freq: Every day | ORAL | Status: DC
  Administered 2018-04-10 – 2018-04-11 (×2): 30 mg via ORAL
  Filled 2018-04-09 (×2): qty 1

## 2018-04-09 MED ORDER — LEVOTHYROXINE SODIUM 88 MCG PO TABS
88.0000 ug | ORAL_TABLET | Freq: Every day | ORAL | Status: DC
  Administered 2018-04-10 – 2018-04-11 (×2): 88 ug via ORAL
  Filled 2018-04-09 (×2): qty 1

## 2018-04-09 MED ORDER — ONDANSETRON HCL 4 MG/2ML IJ SOLN
4.0000 mg | Freq: Once | INTRAMUSCULAR | Status: AC
  Administered 2018-04-09: 4 mg via INTRAVENOUS
  Filled 2018-04-09: qty 2

## 2018-04-09 MED ORDER — PIPERACILLIN-TAZOBACTAM 3.375 G IVPB
3.3750 g | Freq: Three times a day (TID) | INTRAVENOUS | Status: DC
  Administered 2018-04-09 – 2018-04-11 (×6): 3.375 g via INTRAVENOUS
  Filled 2018-04-09 (×7): qty 50

## 2018-04-09 MED ORDER — ONDANSETRON HCL 4 MG PO TABS: 4.0000 mg | ORAL_TABLET | Freq: Four times a day (QID) | ORAL | Status: DC | PRN

## 2018-04-09 MED ORDER — DEXTROSE-NACL 5-0.9 % IV SOLN
INTRAVENOUS | Status: DC
  Administered 2018-04-09 – 2018-04-11 (×3): via INTRAVENOUS

## 2018-04-09 MED ORDER — IOPAMIDOL (ISOVUE-370) INJECTION 76%
INTRAVENOUS | Status: AC
  Administered 2018-04-09: 100 mL
  Filled 2018-04-09: qty 100

## 2018-04-09 MED ORDER — ACETAMINOPHEN 650 MG RE SUPP: 650.0000 mg | Freq: Four times a day (QID) | RECTAL | Status: DC | PRN

## 2018-04-09 MED ORDER — MORPHINE SULFATE (PF) 2 MG/ML IV SOLN
2.0000 mg | INTRAVENOUS | Status: DC | PRN
  Administered 2018-04-09 – 2018-04-10 (×2): 2 mg via INTRAVENOUS
  Filled 2018-04-09 (×3): qty 1

## 2018-04-09 MED ORDER — MORPHINE SULFATE (PF) 4 MG/ML IV SOLN
4.0000 mg | Freq: Once | INTRAVENOUS | Status: AC
  Administered 2018-04-09: 4 mg via INTRAVENOUS
  Filled 2018-04-09: qty 1

## 2018-04-09 MED ORDER — MORPHINE SULFATE ER 15 MG PO TBCR
45.0000 mg | EXTENDED_RELEASE_TABLET | Freq: Every day | ORAL | Status: DC
  Administered 2018-04-10 – 2018-04-11 (×2): 45 mg via ORAL
  Filled 2018-04-09 (×3): qty 3

## 2018-04-09 MED ORDER — HEPARIN SODIUM (PORCINE) 5000 UNIT/ML IJ SOLN
5000.0000 [IU] | Freq: Three times a day (TID) | INTRAMUSCULAR | Status: DC
  Administered 2018-04-09 – 2018-04-11 (×5): 5000 [IU] via SUBCUTANEOUS
  Filled 2018-04-09 (×5): qty 1

## 2018-04-09 MED ORDER — ONDANSETRON HCL 4 MG/2ML IJ SOLN
4.0000 mg | Freq: Four times a day (QID) | INTRAMUSCULAR | Status: DC | PRN
  Administered 2018-04-09: 4 mg via INTRAVENOUS
  Filled 2018-04-09: qty 2

## 2018-04-09 MED ORDER — CYCLOSPORINE 0.05 % OP EMUL
1.0000 [drp] | Freq: Two times a day (BID) | OPHTHALMIC | Status: DC
  Administered 2018-04-10 – 2018-04-11 (×3): 1 [drp] via OPHTHALMIC
  Filled 2018-04-09 (×3): qty 1

## 2018-04-09 MED ORDER — TRAZODONE HCL 50 MG PO TABS
25.0000 mg | ORAL_TABLET | Freq: Every day | ORAL | Status: DC
  Administered 2018-04-09 – 2018-04-10 (×2): 25 mg via ORAL
  Filled 2018-04-09 (×2): qty 1

## 2018-04-09 MED ORDER — GABAPENTIN 100 MG PO CAPS
200.0000 mg | ORAL_CAPSULE | Freq: Every day | ORAL | Status: DC
  Administered 2018-04-09 – 2018-04-10 (×2): 200 mg via ORAL
  Filled 2018-04-09 (×2): qty 2

## 2018-04-09 MED ORDER — SACUBITRIL-VALSARTAN 24-26 MG PO TABS
1.0000 | ORAL_TABLET | Freq: Two times a day (BID) | ORAL | Status: DC
  Administered 2018-04-09 – 2018-04-11 (×4): 1 via ORAL
  Filled 2018-04-09 (×4): qty 1

## 2018-04-09 MED ORDER — SODIUM CHLORIDE 0.9 % IV SOLN
2.0000 g | Freq: Once | INTRAVENOUS | Status: AC
  Administered 2018-04-09: 2 g via INTRAVENOUS
  Filled 2018-04-09: qty 20

## 2018-04-09 MED ORDER — ACETAMINOPHEN 325 MG PO TABS: 650.0000 mg | ORAL_TABLET | Freq: Four times a day (QID) | ORAL | Status: DC | PRN

## 2018-04-09 MED ORDER — ASPIRIN EC 81 MG PO TBEC
81.0000 mg | DELAYED_RELEASE_TABLET | Freq: Every day | ORAL | Status: DC
  Administered 2018-04-10 – 2018-04-11 (×2): 81 mg via ORAL
  Filled 2018-04-09 (×2): qty 1

## 2018-04-09 MED ORDER — ATORVASTATIN CALCIUM 80 MG PO TABS
80.0000 mg | ORAL_TABLET | Freq: Every evening | ORAL | Status: DC
  Administered 2018-04-09 – 2018-04-10 (×2): 80 mg via ORAL
  Filled 2018-04-09 (×3): qty 1

## 2018-04-09 MED ORDER — MORPHINE SULFATE ER 15 MG PO TBCR
30.0000 mg | EXTENDED_RELEASE_TABLET | Freq: Two times a day (BID) | ORAL | Status: DC
  Administered 2018-04-09 – 2018-04-10 (×3): 30 mg via ORAL
  Filled 2018-04-09 (×3): qty 2

## 2018-04-09 MED ORDER — FAMOTIDINE IN NACL 20-0.9 MG/50ML-% IV SOLN
20.0000 mg | INTRAVENOUS | Status: DC
  Administered 2018-04-09 – 2018-04-10 (×2): 20 mg via INTRAVENOUS
  Filled 2018-04-09 (×2): qty 50

## 2018-04-09 MED ORDER — METOPROLOL SUCCINATE ER 50 MG PO TB24
50.0000 mg | ORAL_TABLET | Freq: Every day | ORAL | Status: DC
  Administered 2018-04-10 – 2018-04-11 (×2): 50 mg via ORAL
  Filled 2018-04-09 (×2): qty 1

## 2018-04-09 NOTE — ED Notes (Signed)
Patient is back from xray

## 2018-04-09 NOTE — ED Triage Notes (Signed)
Pt reports r side lower abd pain with nausea since this morning

## 2018-04-09 NOTE — Progress Notes (Signed)
Patient received to 5C08 from ED with Dx of PNA (recurrent).  Also complaining of RLQ pain and nausea.  Guarding with palpation to abdomen.  Bed in low position and call bell within reach.  Pt oriented to staff and to unit routine.  Family in attendance.  Extremities cool to touch.  Afebrile at this time.

## 2018-04-09 NOTE — Progress Notes (Signed)
Pharmacy Antibiotic Note  Tommy Robbins is a 73 y.o. male admitted on 04/09/2018 with intraabdominal infection.  Pharmacy has been consulted for Zosyn dosing. Surgery consult pending, ceftriaxone given x1 earlier today.  Plan: -Zosyn 3.375g IV EI q8h -Monitor LOT, cultures  Height: 5\' 10"  (177.8 cm) Weight: 170 lb (77.1 kg) IBW/kg (Calculated) : 73  Temp (24hrs), Avg:97.5 F (36.4 C), Min:97.5 F (36.4 C), Max:97.5 F (36.4 C)  Recent Labs  Lab 04/09/18 1240  WBC 5.4  CREATININE 1.14    Estimated Creatinine Clearance: 61.4 mL/min (by C-G formula based on SCr of 1.14 mg/dL).    Allergies  Allergen Reactions  . Other Other (See Comments)    Seasonal allergies - sniffling     Antimicrobials this admission: Zosyn 1/18 >>  Dose adjustments this admission: none  Microbiology results: none  Thank you for allowing pharmacy to be a part of this patient's care.  Fredonia Highland, PharmD, BCPS Clinical Pharmacist Please check AMION for all [facility] Pharmacy numbers 04/09/2018

## 2018-04-09 NOTE — ED Notes (Signed)
Patient transported to X-ray 

## 2018-04-09 NOTE — Consult Note (Signed)
CC: Consult by internal medicine, Dr. Ella Jubilee for possible cholecystitis  HPI: Tommy Robbins is an 73 y.o. male with hx HTN, HCV, cirrhosis, HLD, hypothyroidism, CAD/AICD (s/p MI and vfib arrest 10/2016 requiring CPR) whom presented to ED today with 1d hx of RUQ pain. Pain has been sharp and does not radiate. Associated nausea, no emesis. Reports never having had this before. He denies fever/chills   Prior vfib arrest and subsequent cardiac cath - unsuccessful at attempts to cross LAD CTO so he has been treated medically  WBC 5.4 LFTs ~normal, tbili 0.7; lipase was not ordered  CT A/P - distended gallbladder, possible stone in neck, no wall thickening or adjacent inflammation. Cirrhosis, mild splenomegaly. Mild gastrohepatic ligament thickening presumed reactive in setting of cirrhosis. Recanalization of umbilical vein.  RUQ [place]: GB distention, sludge, +Murphy's, possible minimal PCCF. No wall thickening. No stones. CBD 6 mm. Recanalized umbilical vein, bidirectional flow in portal vein.    Past Medical History:  Diagnosis Date  . AICD (automatic cardioverter/defibrillator) present 03/24/2017  . Cataract   . Cellulitis and abscess of right leg 10/06/2016   right thigh  . Chronic low back pain   . Colon polyps   . Depression   . Diverticulosis   . GERD (gastroesophageal reflux disease)   . Hepatitis C   . History of sudden cardiac arrest 10/25/2016   witness/notes 02/10/2017  . Hx of substance abuse (HCC)    [name] 02/10/2017  . Hypertension   . Insomnia   . Thyroid disease    hypothyroid    Past Surgical History:  Procedure Laterality Date  . CARDIAC CATHETERIZATION    . CARDIAC DEFIBRILLATOR PLACEMENT  03/24/2017  . IABP INSERTION N/A 10/24/2016   Procedure: IABP Insertion;  Surgeon: Runell Gess, MD;  Location: [facility] INVASIVE CV LAB;  Service: Cardiovascular;  Laterality: N/A;  . ICD IMPLANT N/A 03/24/2017   MDT [place] MRI XT DR ICD implanted by Dr Johney Frame for secondary  prevention (VF arrest)  . JOINT REPLACEMENT    . LEFT HEART CATH AND CORONARY ANGIOGRAPHY N/A 10/24/2016   Procedure: LEFT HEART CATH AND CORONARY ANGIOGRAPHY;  Surgeon: Runell Gess, MD;  Location: [facility] INVASIVE CV LAB;  Service: Cardiovascular;  Laterality: N/A;  . TOTAL SHOULDER REPLACEMENT Right 03/23/2008    Family History  Problem Relation Age of Onset  . Arthritis Father        rheumatoid  . Hypertension Sister   . Cancer Sister   . Scoliosis Sister   . Arthritis Sister   . Osteoarthritis Mother   . Arthritis/Rheumatoid Sister   . Peripheral vascular disease Sister   . Arrhythmia Sister   . CAD Brother     Social:  reports that he quit smoking about a year ago. His smoking use included cigarettes. He quit after 53.00 years of use. He has never used smokeless tobacco. He reports current alcohol use of about 5.0 standard drinks of alcohol per week. He reports current drug use. Drug: Marijuana.  Allergies:  Allergies  Allergen Reactions  . Other Other (See Comments)    Seasonal allergies - sniffling    Medications: I have reviewed the patient's current medications.  Results for orders placed or performed during the hospital encounter of 04/09/18 (from the past 48 hour(s))  Comprehensive metabolic panel     Status: Abnormal   Collection Time: 04/09/18 12:40 PM  Result Value Ref Range   Sodium 136 135 - 145 mmol/L   Potassium 4.1 3.5 -  5.1 mmol/L   Chloride 102 98 - 111 mmol/L   CO2 23 22 - 32 mmol/L   Glucose, Bld 151 (H) 70 - 99 mg/dL   BUN 19 8 - 23 mg/dL   Creatinine, Ser 1.61 0.61 - 1.24 mg/dL   Calcium 9.0 8.9 - 09.6 mg/dL   Total Protein 6.3 (L) 6.5 - 8.1 g/dL   Albumin 3.7 3.5 - 5.0 g/dL   AST 45 (H) 15 - 41 U/L   ALT 41 0 - 44 U/L   Alkaline Phosphatase 82 38 - 126 U/L   Total Bilirubin 0.7 0.3 - 1.2 mg/dL   GFR calc non Af Amer >60 >60 mL/min   GFR calc Af Amer >60 >60 mL/min   Anion gap 11 5 - 15    Comment: Performed at [facility], [address], [place], [place] [address]  CBC with Differential     Status: Abnormal   Collection Time: 04/09/18 12:40 PM  Result Value Ref Range   WBC 5.4 4.0 - 10.5 K/uL   RBC 4.70 4.22 - 5.81 MIL/uL   Hemoglobin 14.6 13.0 - 17.0 g/dL   HCT 98.1 19.1 - 47.8 %   MCV 92.3 80.0 - 100.0 fL   MCH 31.1 26.0 - 34.0 pg   MCHC 33.6 30.0 - 36.0 g/dL   RDW 29.5 62.1 - 30.8 %   Platelets 120 (L) 150 - 400 K/uL    Comment: REPEATED TO VERIFY PLATELET COUNT CONFIRMED BY SMEAR SPECIMEN CHECKED FOR CLOTS    nRBC 0.0 0.0 - 0.2 %   Neutrophils Relative % 79 %   Neutro Abs 4.2 1.7 - 7.7 K/uL   Lymphocytes Relative 9 %   Lymphs Abs 0.5 (L) 0.7 - 4.0 K/uL   Monocytes Relative 10 %   Monocytes Absolute 0.5 0.1 - 1.0 K/uL   Eosinophils Relative 2 %   Eosinophils Absolute 0.1 0.0 - 0.5 K/uL   Basophils Relative 0 %   Basophils Absolute 0.0 0.0 - 0.1 K/uL   Immature Granulocytes 0 %   Abs Immature Granulocytes 0.02 0.00 - 0.07 K/uL    Comment: Performed at [facility], [address] [place], [place] [address]  D-dimer, quantitative     Status: Abnormal   Collection Time: 04/09/18 12:40 PM  Result Value Ref Range   D-Dimer, Quant 0.72 (H) 0.00 - 0.50 ug/mL-FEU    Comment: (NOTE) At the manufacturer cut-off of 0.50 ug/mL FEU, this assay has been documented to exclude PE with a sensitivity and negative predictive value of 97 to 99%.  At this time, this assay has not been approved by the FDA to exclude DVT/VTE. Results should be correlated with clinical presentation. Performed at [facility], [address] [place], [place] [address]   I-stat troponin, ED     Status: None   Collection Time: 04/09/18 12:45 PM  Result Value Ref Range   Troponin i, poc 0.01 0.00 - 0.08 ng/mL   Comment 3            Comment: Due to the release kinetics of cTnI, a negative result within the first hours of the onset of symptoms does not rule out myocardial infarction with certainty. If myocardial  infarction is still suspected, repeat the test at appropriate intervals.     Dg Chest 2 View  Result Date: 04/09/2018 CLINICAL DATA:  Right chest pain and shortness of breath. EXAM: CHEST - 2 VIEW COMPARISON:  March 25, 2017 FINDINGS: The heart size and mediastinal contours are stable. Cardiac pacemaker is unchanged. Mild opacity is identified in the left lung base at least in part due to atelectasis but developing pneumonia is not excluded. The right lung is clear. The visualized skeletal structures are stable. IMPRESSION: Mild opacity is identified in the left lung base at least in part due to atelectasis but developing pneumonia is not excluded. Electronically Signed   By: Sherian Rein M.D.   On: 04/09/2018 13:39   Ct Angio Chest Pe W/cm &/or Wo Cm  Result Date: 04/09/2018 CLINICAL DATA:  Right sided upper quadrant to lower abdominal pain with nausea since this morning. EXAM: CT ANGIOGRAPHY CHEST CT ABDOMEN AND PELVIS WITH CONTRAST TECHNIQUE: Multidetector CT imaging of the chest was performed using the standard protocol during bolus administration of intravenous contrast. Multiplanar CT image reconstructions and MIPs were obtained to evaluate the vascular anatomy. Multidetector CT imaging of the abdomen and pelvis was performed using the standard protocol during bolus administration of intravenous contrast. CONTRAST:  ISOVUE-370 IOPAMIDOL (ISOVUE-370) INJECTION 76% COMPARISON:  CT abdomen pelvis, 05/20/2004 FINDINGS: CTA CHEST FINDINGS Cardiovascular: There is satisfactory opacification of the pulmonary arteries to the segmental level. There is no evidence of a pulmonary embolism. Heart is top-normal in size. No pericardial effusion. There are dense three-vessel coronary artery calcifications. Great vessels are normal in caliber. Mild aortic atherosclerosis. Mediastinum/Nodes: No neck base, axillary, mediastinal or hilar masses or enlarged lymph nodes. Trachea and esophagus are unremarkable.  Lungs/Pleura: There is dependent opacity in the lower lobes and in the posterior inferior left upper lobe lingula, most likely atelectasis. 3 mm subpleural nodule, lateral right middle lobe, image 67, series 7, without significant change from the 2006 CT. No other nodules. There is no evidence of pulmonary edema. No pleural effusion or pneumothorax. Musculoskeletal: No fracture or acute finding. No osteoblastic or osteolytic lesions. Review of the MIP images confirms the above findings. CT ABDOMEN and PELVIS FINDINGS Hepatobiliary: Liver demonstrates morphologic changes of cirrhosis, overall decreased size, with central volume loss and relative enlargement of the lateral segment of the left lobe and caudate lobe as well as nodularity. No discrete liver mass. Gallbladder is distended density suggested in the gallbladder neck which may reflect a small stone. There is no wall thickening. No pericholecystic fluid. No bile duct dilation. Pancreas: Unremarkable. No pancreatic ductal dilatation or surrounding inflammatory changes. Spleen: Spleen mildly enlarged measuring 14 x 5 x 11 cm. No splenic mass or focal lesion. Adrenals/Urinary Tract: No adrenal masses. Two small low-density masses, 6 mm, lower pole right and 5 mm, midpole left, consistent with small cysts or angiomyolipomas. No other renal masses. Nonobstructing stone midpole the left kidney. No hydronephrosis. Normal ureters. Normal bladder. Stomach/Bowel: Stomach is unremarkable. Small bowel is normal caliber. No wall thickening or inflammation. There are multiple left colon diverticula mostly along the sigmoid. No diverticulitis. No colon wall thickening or other inflammatory process. Normal appendix visualized. Vascular/Lymphatic: Dense aortic atherosclerosis. No aneurysm. There are prominent to mildly enlarged lymph nodes along the gastrohepatic ligament. Reproductive: Unremarkable Other: No hernia or ascites. Musculoskeletal: No fracture or acute finding. No  osteoblastic or osteolytic lesions. Degenerative changes noted throughout the visualized spine. Review of the MIP images confirms the above findings. IMPRESSION: CTA CHEST 1. No evidence of a pulmonary embolism. 2. Dependent opacity in the lower lobes most consistent with atelectasis. Consider a component of pneumonia if there are consistent clinical findings. No evidence of pulmonary edema. 3. Coronary artery calcifications  and aortic atherosclerosis. CT ABDOMEN AND PELVIS 1. Gallbladder is distended and there is a possible stone in the gallbladder neck, but there is no wall thickening or adjacent inflammation. If clinical findings strongly support acute cholecystitis, consider follow-up right upper quadrant ultrasound for further assessment. 2. Cirrhosis.  Mild splenomegaly. 3. Mild gastrohepatic ligament adenopathy, presumed reactive in the setting of cirrhosis. 4. Dense aortic atherosclerosis. 5. Left colon diverticula without diverticulitis. Electronically Signed   By: Amie Portland M.D.   On: 04/09/2018 14:22   Ct Abdomen Pelvis W Contrast  Result Date: 04/09/2018 CLINICAL DATA:  Right sided upper quadrant to lower abdominal pain with nausea since this morning. EXAM: CT ANGIOGRAPHY CHEST CT ABDOMEN AND PELVIS WITH CONTRAST TECHNIQUE: Multidetector CT imaging of the chest was performed using the standard protocol during bolus administration of intravenous contrast. Multiplanar CT image reconstructions and MIPs were obtained to evaluate the vascular anatomy. Multidetector CT imaging of the abdomen and pelvis was performed using the standard protocol during bolus administration of intravenous contrast. CONTRAST:  ISOVUE-370 IOPAMIDOL (ISOVUE-370) INJECTION 76% COMPARISON:  CT abdomen pelvis, 05/20/2004 FINDINGS: CTA CHEST FINDINGS Cardiovascular: There is satisfactory opacification of the pulmonary arteries to the segmental level. There is no evidence of a pulmonary embolism. Heart is top-normal in  size. No pericardial effusion. There are dense three-vessel coronary artery calcifications. Great vessels are normal in caliber. Mild aortic atherosclerosis. Mediastinum/Nodes: No neck base, axillary, mediastinal or hilar masses or enlarged lymph nodes. Trachea and esophagus are unremarkable. Lungs/Pleura: There is dependent opacity in the lower lobes and in the posterior inferior left upper lobe lingula, most likely atelectasis. 3 mm subpleural nodule, lateral right middle lobe, image 67, series 7, without significant change from the 2006 CT. No other nodules. There is no evidence of pulmonary edema. No pleural effusion or pneumothorax. Musculoskeletal: No fracture or acute finding. No osteoblastic or osteolytic lesions. Review of the MIP images confirms the above findings. CT ABDOMEN and PELVIS FINDINGS Hepatobiliary: Liver demonstrates morphologic changes of cirrhosis, overall decreased size, with central volume loss and relative enlargement of the lateral segment of the left lobe and caudate lobe as well as nodularity. No discrete liver mass. Gallbladder is distended density suggested in the gallbladder neck which may reflect a small stone. There is no wall thickening. No pericholecystic fluid. No bile duct dilation. Pancreas: Unremarkable. No pancreatic ductal dilatation or surrounding inflammatory changes. Spleen: Spleen mildly enlarged measuring 14 x 5 x 11 cm. No splenic mass or focal lesion. Adrenals/Urinary Tract: No adrenal masses. Two small low-density masses, 6 mm, lower pole right and 5 mm, midpole left, consistent with small cysts or angiomyolipomas. No other renal masses. Nonobstructing stone midpole the left kidney. No hydronephrosis. Normal ureters. Normal bladder. Stomach/Bowel: Stomach is unremarkable. Small bowel is normal caliber. No wall thickening or inflammation. There are multiple left colon diverticula mostly along the sigmoid. No diverticulitis. No colon wall thickening or other  inflammatory process. Normal appendix visualized. Vascular/Lymphatic: Dense aortic atherosclerosis. No aneurysm. There are prominent to mildly enlarged lymph nodes along the gastrohepatic ligament. Reproductive: Unremarkable Other: No hernia or ascites. Musculoskeletal: No fracture or acute finding. No osteoblastic or osteolytic lesions. Degenerative changes noted throughout the visualized spine. Review of the MIP images confirms the above findings. IMPRESSION: CTA CHEST 1. No evidence of a pulmonary embolism. 2. Dependent opacity in the lower lobes most consistent with atelectasis. Consider a component of pneumonia if there are consistent clinical findings. No evidence of pulmonary edema. 3. Coronary artery calcifications  and aortic atherosclerosis. CT ABDOMEN AND PELVIS 1. Gallbladder is distended and there is a possible stone in the gallbladder neck, but there is no wall thickening or adjacent inflammation. If clinical findings strongly support acute cholecystitis, consider follow-up right upper quadrant ultrasound for further assessment. 2. Cirrhosis.  Mild splenomegaly. 3. Mild gastrohepatic ligament adenopathy, presumed reactive in the setting of cirrhosis. 4. Dense aortic atherosclerosis. 5. Left colon diverticula without diverticulitis. Electronically Signed   By: Amie Portland M.D.   On: 04/09/2018 14:22   US Abdomen Limited Ruq  Result Date: 04/09/2018 CLINICAL DATA:  Right upper quadrant pain. EXAM: ULTRASOUND ABDOMEN LIMITED RIGHT UPPER QUADRANT COMPARISON:  None. FINDINGS: Gallbladder: The gallbladder is distended without wall thickening. The wall measures 2 mm. There may be a tiny amount of pericholecystic fluid. A positive Murphy's sign is reported. There is sludge in the gallbladder with no stones. Common bile duct: Diameter: 6.3 mm Liver: A nodular contour and heterogeneous echotexture of the liver is consistent with known cirrhosis. No focal mass. The umbilical vein is recannulized. The portal  vein is patent but appears to contain bidirectional flow. IMPRESSION: 1. Findings associated with the gallbladder are mixed. Gallbladder distension, gallbladder sludge, a positive Murphy's sign, and the possibility of minimal pericholecystic fluid all raise the possibility of acute cholecystitis. However, there is no wall thickening or stones. A HIDA scan could further evaluate as clinically warranted. 2. Cirrhotic liver. 3. Recannulization of the umbilical vein and bidirectional flow in the portal vein are consistent with portal venous hypertension. 4. The common bile duct is borderline in caliber. Recommend correlation with labs. Electronically Signed   By: Gerome Sam III M.D   On: 04/09/2018 15:58    ROS - all of the below systems have been reviewed with the patient and positives are indicated with bold text General: chills, fever or night sweats Eyes: blurry vision or double vision ENT: epistaxis or sore throat Allergy/Immunology: itchy/watery eyes or nasal congestion Hematologic/Lymphatic: bleeding problems, blood clots or swollen lymph nodes Endocrine: temperature intolerance or unexpected weight changes Breast: new or changing breast lumps or nipple discharge Resp: cough, shortness of breath, or wheezing CV: chest pain or dyspnea on exertion GI: as per HPI GU: dysuria, trouble voiding, or hematuria MSK: joint pain or joint stiffness Neuro: TIA or stroke symptoms Derm: pruritus and skin lesion changes Psych: anxiety and depression  PE Blood pressure (!) 141/81, pulse (!) 53, temperature (!) 97.5 F (36.4 C), temperature source Oral, resp. rate 15, height 5\' 10"  (1.778 m), weight 77.1 kg, SpO2 96 %. Constitutional: NAD; conversant; no deformities Eyes: Moist conjunctiva; no lid lag; anicteric; PERRL Neck: Trachea midline; no thyromegaly Lungs: Normal respiratory effort; no tactile fremitus CV: RRR; no palpable thrills; no pitting edema GI: Abd soft, focal tenderness in RUQ; no  MEG pain; no tenderness elsewhere; no rebound; no guarding.  MSK: Normal gait; no clubbing/cyanosis Psychiatric: Appropriate affect; alert and oriented x3 Lymphatic: No palpable cervical or axillary lymphadenopathy  Results for orders placed or performed during the hospital encounter of 04/09/18 (from the past 48 hour(s))  Comprehensive metabolic panel     Status: Abnormal   Collection Time: 04/09/18 12:40 PM  Result Value Ref Range   Sodium 136 135 - 145 mmol/L   Potassium 4.1 3.5 - 5.1 mmol/L   Chloride 102 98 - 111 mmol/L   CO2 23 22 - 32 mmol/L   Glucose, Bld 151 (H) 70 - 99 mg/dL   BUN 19 8 - 23 mg/dL  Creatinine, Ser 1.14 0.61 - 1.24 mg/dL   Calcium 9.0 8.9 - 34.9 mg/dL   Total Protein 6.3 (L) 6.5 - 8.1 g/dL   Albumin 3.7 3.5 - 5.0 g/dL   AST 45 (H) 15 - 41 U/L   ALT 41 0 - 44 U/L   Alkaline Phosphatase 82 38 - 126 U/L   Total Bilirubin 0.7 0.3 - 1.2 mg/dL   GFR calc non Af Amer >60 >60 mL/min   GFR calc Af Amer >60 >60 mL/min   Anion gap 11 5 - 15    Comment: Performed at [facility], [address], [place], [place] [address]  CBC with Differential     Status: Abnormal   Collection Time: 04/09/18 12:40 PM  Result Value Ref Range   WBC 5.4 4.0 - 10.5 K/uL   RBC 4.70 4.22 - 5.81 MIL/uL   Hemoglobin 14.6 13.0 - 17.0 g/dL   HCT 05.6 97.9 - 48.0 %   MCV 92.3 80.0 - 100.0 fL   MCH 31.1 26.0 - 34.0 pg   MCHC 33.6 30.0 - 36.0 g/dL   RDW 16.5 53.7 - 48.2 %   Platelets 120 (L) 150 - 400 K/uL    Comment: REPEATED TO VERIFY PLATELET COUNT CONFIRMED BY SMEAR SPECIMEN CHECKED FOR CLOTS    nRBC 0.0 0.0 - 0.2 %   Neutrophils Relative % 79 %   Neutro Abs 4.2 1.7 - 7.7 K/uL   Lymphocytes Relative 9 %   Lymphs Abs 0.5 (L) 0.7 - 4.0 K/uL   Monocytes Relative 10 %   Monocytes Absolute 0.5 0.1 - 1.0 K/uL   Eosinophils Relative 2 %   Eosinophils Absolute 0.1 0.0 - 0.5 K/uL   Basophils Relative 0 %   Basophils Absolute 0.0 0.0 - 0.1 K/uL   Immature Granulocytes 0 %    Abs Immature Granulocytes 0.02 0.00 - 0.07 K/uL    Comment: Performed at [facility], [address] [place], [place] [address]  D-dimer, quantitative     Status: Abnormal   Collection Time: 04/09/18 12:40 PM  Result Value Ref Range   D-Dimer, Quant 0.72 (H) 0.00 - 0.50 ug/mL-FEU    Comment: (NOTE) At the manufacturer cut-off of 0.50 ug/mL FEU, this assay has been documented to exclude PE with a sensitivity and negative predictive value of 97 to 99%.  At this time, this assay has not been approved by the FDA to exclude DVT/VTE. Results should be correlated with clinical presentation. Performed at [facility], [address], [place], [place] [address]   I-stat troponin, ED     Status: None   Collection Time: 04/09/18 12:45 PM  Result Value Ref Range   Troponin i, poc 0.01 0.00 - 0.08 ng/mL   Comment 3            Comment: Due to the release kinetics of cTnI, a negative result within the first hours of the onset of symptoms does not rule out myocardial infarction with certainty. If myocardial infarction is still suspected, repeat the test at appropriate intervals.     Dg Chest 2 View  Result Date: 04/09/2018 CLINICAL DATA:  Right chest pain and shortness of breath. EXAM: CHEST - 2 VIEW COMPARISON:  March 25, 2017 FINDINGS: The heart size and mediastinal contours are stable. Cardiac pacemaker is unchanged. Mild opacity is identified in the left lung base at least in part due to atelectasis but developing pneumonia is not excluded. The right lung  is clear. The visualized skeletal structures are stable. IMPRESSION: Mild opacity is identified in the left lung base at least in part due to atelectasis but developing pneumonia is not excluded. Electronically Signed   By: Sherian Rein M.D.   On: 04/09/2018 13:39   Ct Angio Chest Pe W/cm &/or Wo Cm  Result Date: 04/09/2018 CLINICAL DATA:  Right sided upper quadrant to lower abdominal pain with nausea since this morning. EXAM:  CT ANGIOGRAPHY CHEST CT ABDOMEN AND PELVIS WITH CONTRAST TECHNIQUE: Multidetector CT imaging of the chest was performed using the standard protocol during bolus administration of intravenous contrast. Multiplanar CT image reconstructions and MIPs were obtained to evaluate the vascular anatomy. Multidetector CT imaging of the abdomen and pelvis was performed using the standard protocol during bolus administration of intravenous contrast. CONTRAST:  ISOVUE-370 IOPAMIDOL (ISOVUE-370) INJECTION 76% COMPARISON:  CT abdomen pelvis, 05/20/2004 FINDINGS: CTA CHEST FINDINGS Cardiovascular: There is satisfactory opacification of the pulmonary arteries to the segmental level. There is no evidence of a pulmonary embolism. Heart is top-normal in size. No pericardial effusion. There are dense three-vessel coronary artery calcifications. Great vessels are normal in caliber. Mild aortic atherosclerosis. Mediastinum/Nodes: No neck base, axillary, mediastinal or hilar masses or enlarged lymph nodes. Trachea and esophagus are unremarkable. Lungs/Pleura: There is dependent opacity in the lower lobes and in the posterior inferior left upper lobe lingula, most likely atelectasis. 3 mm subpleural nodule, lateral right middle lobe, image 67, series 7, without significant change from the 2006 CT. No other nodules. There is no evidence of pulmonary edema. No pleural effusion or pneumothorax. Musculoskeletal: No fracture or acute finding. No osteoblastic or osteolytic lesions. Review of the MIP images confirms the above findings. CT ABDOMEN and PELVIS FINDINGS Hepatobiliary: Liver demonstrates morphologic changes of cirrhosis, overall decreased size, with central volume loss and relative enlargement of the lateral segment of the left lobe and caudate lobe as well as nodularity. No discrete liver mass. Gallbladder is distended density suggested in the gallbladder neck which may reflect a small stone. There is no wall thickening. No  pericholecystic fluid. No bile duct dilation. Pancreas: Unremarkable. No pancreatic ductal dilatation or surrounding inflammatory changes. Spleen: Spleen mildly enlarged measuring 14 x 5 x 11 cm. No splenic mass or focal lesion. Adrenals/Urinary Tract: No adrenal masses. Two small low-density masses, 6 mm, lower pole right and 5 mm, midpole left, consistent with small cysts or angiomyolipomas. No other renal masses. Nonobstructing stone midpole the left kidney. No hydronephrosis. Normal ureters. Normal bladder. Stomach/Bowel: Stomach is unremarkable. Small bowel is normal caliber. No wall thickening or inflammation. There are multiple left colon diverticula mostly along the sigmoid. No diverticulitis. No colon wall thickening or other inflammatory process. Normal appendix visualized. Vascular/Lymphatic: Dense aortic atherosclerosis. No aneurysm. There are prominent to mildly enlarged lymph nodes along the gastrohepatic ligament. Reproductive: Unremarkable Other: No hernia or ascites. Musculoskeletal: No fracture or acute finding. No osteoblastic or osteolytic lesions. Degenerative changes noted throughout the visualized spine. Review of the MIP images confirms the above findings. IMPRESSION: CTA CHEST 1. No evidence of a pulmonary embolism. 2. Dependent opacity in the lower lobes most consistent with atelectasis. Consider a component of pneumonia if there are consistent clinical findings. No evidence of pulmonary edema. 3. Coronary artery calcifications and aortic atherosclerosis. CT ABDOMEN AND PELVIS 1. Gallbladder is distended and there is a possible stone in the gallbladder neck, but there is no wall thickening or adjacent inflammation. If clinical findings strongly support acute cholecystitis, consider follow-up  right upper quadrant ultrasound for further assessment. 2. Cirrhosis.  Mild splenomegaly. 3. Mild gastrohepatic ligament adenopathy, presumed reactive in the setting of cirrhosis. 4. Dense aortic  atherosclerosis. 5. Left colon diverticula without diverticulitis. Electronically Signed   By: Amie Portlandavid  Ormond M.D.   On: 04/09/2018 14:22   Ct Abdomen Pelvis W Contrast  Result Date: 04/09/2018 CLINICAL DATA:  Right sided upper quadrant to lower abdominal pain with nausea since this morning. EXAM: CT ANGIOGRAPHY CHEST CT ABDOMEN AND PELVIS WITH CONTRAST TECHNIQUE: Multidetector CT imaging of the chest was performed using the standard protocol during bolus administration of intravenous contrast. Multiplanar CT image reconstructions and MIPs were obtained to evaluate the vascular anatomy. Multidetector CT imaging of the abdomen and pelvis was performed using the standard protocol during bolus administration of intravenous contrast. CONTRAST:  100mL ISOVUE-370 IOPAMIDOL (ISOVUE-370) INJECTION 76% COMPARISON:  CT abdomen pelvis, 05/20/2004 FINDINGS: CTA CHEST FINDINGS Cardiovascular: There is satisfactory opacification of the pulmonary arteries to the segmental level. There is no evidence of a pulmonary embolism. Heart is top-normal in size. No pericardial effusion. There are dense three-vessel coronary artery calcifications. Great vessels are normal in caliber. Mild aortic atherosclerosis. Mediastinum/Nodes: No neck base, axillary, mediastinal or hilar masses or enlarged lymph nodes. Trachea and esophagus are unremarkable. Lungs/Pleura: There is dependent opacity in the lower lobes and in the posterior inferior left upper lobe lingula, most likely atelectasis. 3 mm subpleural nodule, lateral right middle lobe, image 67, series 7, without significant change from the 2006 CT. No other nodules. There is no evidence of pulmonary edema. No pleural effusion or pneumothorax. Musculoskeletal: No fracture or acute finding. No osteoblastic or osteolytic lesions. Review of the MIP images confirms the above findings. CT ABDOMEN and PELVIS FINDINGS Hepatobiliary: Liver demonstrates morphologic changes of cirrhosis, overall  decreased size, with central volume loss and relative enlargement of the lateral segment of the left lobe and caudate lobe as well as nodularity. No discrete liver mass. Gallbladder is distended density suggested in the gallbladder neck which may reflect a small stone. There is no wall thickening. No pericholecystic fluid. No bile duct dilation. Pancreas: Unremarkable. No pancreatic ductal dilatation or surrounding inflammatory changes. Spleen: Spleen mildly enlarged measuring 14 x 5 x 11 cm. No splenic mass or focal lesion. Adrenals/Urinary Tract: No adrenal masses. Two small low-density masses, 6 mm, lower pole right and 5 mm, midpole left, consistent with small cysts or angiomyolipomas. No other renal masses. Nonobstructing stone midpole the left kidney. No hydronephrosis. Normal ureters. Normal bladder. Stomach/Bowel: Stomach is unremarkable. Small bowel is normal caliber. No wall thickening or inflammation. There are multiple left colon diverticula mostly along the sigmoid. No diverticulitis. No colon wall thickening or other inflammatory process. Normal appendix visualized. Vascular/Lymphatic: Dense aortic atherosclerosis. No aneurysm. There are prominent to mildly enlarged lymph nodes along the gastrohepatic ligament. Reproductive: Unremarkable Other: No hernia or ascites. Musculoskeletal: No fracture or acute finding. No osteoblastic or osteolytic lesions. Degenerative changes noted throughout the visualized spine. Review of the MIP images confirms the above findings. IMPRESSION: CTA CHEST 1. No evidence of a pulmonary embolism. 2. Dependent opacity in the lower lobes most consistent with atelectasis. Consider a component of pneumonia if there are consistent clinical findings. No evidence of pulmonary edema. 3. Coronary artery calcifications and aortic atherosclerosis. CT ABDOMEN AND PELVIS 1. Gallbladder is distended and there is a possible stone in the gallbladder neck, but there is no wall thickening or  adjacent inflammation. If clinical findings strongly support acute cholecystitis, consider  follow-up right upper quadrant ultrasound for further assessment. 2. Cirrhosis.  Mild splenomegaly. 3. Mild gastrohepatic ligament adenopathy, presumed reactive in the setting of cirrhosis. 4. Dense aortic atherosclerosis. 5. Left colon diverticula without diverticulitis. Electronically Signed   By: Amie Portlandavid  Ormond M.D.   On: 04/09/2018 14:22   [place] Abdomen Limited Ruq  Result Date: 04/09/2018 CLINICAL DATA:  Right upper quadrant pain. EXAM: ULTRASOUND ABDOMEN LIMITED RIGHT UPPER QUADRANT COMPARISON:  None. FINDINGS: Gallbladder: The gallbladder is distended without wall thickening. The wall measures 2 mm. There may be a tiny amount of pericholecystic fluid. A positive Murphy's sign is reported. There is sludge in the gallbladder with no stones. Common bile duct: Diameter: 6.3 mm Liver: A nodular contour and heterogeneous echotexture of the liver is consistent with known cirrhosis. No focal mass. The umbilical vein is recannulized. The portal vein is patent but appears to contain bidirectional flow. IMPRESSION: 1. Findings associated with the gallbladder are mixed. Gallbladder distension, gallbladder sludge, a positive Murphy's sign, and the possibility of minimal pericholecystic fluid all raise the possibility of acute cholecystitis. However, there is no wall thickening or stones. A HIDA scan could further evaluate as clinically warranted. 2. Cirrhotic liver. 3. Recannulization of the umbilical vein and bidirectional flow in the portal vein are consistent with portal venous hypertension. 4. The common bile duct is borderline in caliber. Recommend correlation with labs. Electronically Signed   By: Gerome Samavid  Williams III M.D   On: 04/09/2018 15:58   A/P: Duke Salviaichard M Ebarb is an 73 y.o. 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 Cliffton AstersWhite, M.D. [facility]

## 2018-04-09 NOTE — ED Provider Notes (Signed)
[facility] EMERGENCY DEPARTMENT Provider Note   CSN: [id number] Arrival date & time: 04/09/18  1216     History   Chief Complaint Chief Complaint  Patient presents with  . Abdominal Pain    HPI Tommy Robbins is a 73 y.o. male.  The history is provided by the patient and medical records. No language interpreter was used.   Tommy Robbins is a 73 y.o. male who presents to the Emergency Department complaining of right side pain. Presents to the emergency department complaining of right side pain that began this morning. Pain is sharp in nature and located in the right mid axillary line. It is worse with moving as well as with deep breaths. He denies any fevers. He has a mild, chronic cough that is unchanged from baseline. He does have mild shortness of breath. He has mild associated nausea. No vomiting, dysuria, diarrhea, leg swelling or pain. He has a history of cardiac arrest and myocardial infarction. He is compliant with his medications. No prior similar symptoms. Past Medical History:  Diagnosis Date  . AICD (automatic cardioverter/defibrillator) present 03/24/2017  . Cataract   . Cellulitis and abscess of right leg 10/06/2016   right thigh  . Chronic low back pain   . Colon polyps   . Depression   . Diverticulosis   . GERD (gastroesophageal reflux disease)   . Hepatitis C   . History of sudden cardiac arrest 10/25/2016   witness/notes 02/10/2017  . Hx of substance abuse (HCC)    [name] 02/10/2017  . Hypertension   . Insomnia   . Thyroid disease    hypothyroid    Patient Active Problem List   Diagnosis Date Noted  . Ischemic cardiomyopathy 03/24/2017  . CAD (coronary artery disease), native coronary artery 11/06/2016  . Systolic CHF (HCC) 11/06/2016  . Acute ST elevation myocardial infarction (STEMI) involving left anterior descending (LAD) coronary artery (HCC) 10/25/2016  . Actinic keratosis 07/25/2015  . Chronic low back pain   . COPD  (chronic obstructive pulmonary disease) (HCC) 09/11/2012  . Hepatitis C 09/11/2012  . GERD (gastroesophageal reflux disease) 09/11/2012  . Insomnia 09/11/2012  . Hypothyroidism 09/20/2006  . HYPERTENSION, BENIGN ESSENTIAL 09/20/2006  . ECHOCARDIOGRAM, HX OF 09/20/2006    Past Surgical History:  Procedure Laterality Date  . CARDIAC CATHETERIZATION    . CARDIAC DEFIBRILLATOR PLACEMENT  03/24/2017  . IABP INSERTION N/A 10/24/2016   Procedure: IABP Insertion;  Surgeon: Runell Gess, MD;  Location: [facility] INVASIVE CV LAB;  Service: Cardiovascular;  Laterality: N/A;  . ICD IMPLANT N/A 03/24/2017   MDT [place] MRI XT DR ICD implanted by Dr Johney Frame for secondary prevention (VF arrest)  . JOINT REPLACEMENT    . LEFT HEART CATH AND CORONARY ANGIOGRAPHY N/A 10/24/2016   Procedure: LEFT HEART CATH AND CORONARY ANGIOGRAPHY;  Surgeon: Runell Gess, MD;  Location: [facility] INVASIVE CV LAB;  Service: Cardiovascular;  Laterality: N/A;  . TOTAL SHOULDER REPLACEMENT Right 03/23/2008        Home Medications    Prior to Admission medications   Medication Sig Start Date End Date Taking? Authorizing Provider  aspirin EC 81 MG tablet Take 81 mg by mouth daily.    [provider]  atorvastatin (LIPITOR) 80 MG tablet TAKE 1 TABLET DAILY AT [facility] 08/18/17   Salley Scarlet, MD  cycloSPORINE (RESTASIS) 0.05 % ophthalmic emulsion Place 1 drop into both eyes 2 (two) times daily.    [provider]  diclofenac  sodium (VOLTAREN) 1 % GEL Apply 1 application topically daily as needed (for pain).    [provider]  ENTRESTO 24-26 MG TAKE 1 TABLET BY MOUTH TWICE A DAY 02/23/18   Bensimhon, Bevelyn Buckles, MD  furosemide (LASIX) 20 MG tablet Take 1 tablet (20 mg total) by mouth daily as needed. 01/20/17 11/11/18  Azalee Course, PA  gabapentin (NEURONTIN) 100 MG capsule Take 200 mg by mouth at bedtime.  03/03/17   [provider]  isosorbide mononitrate (IMDUR) 30 MG 24 hr tablet TAKE 1 TABLET BY MOUTH  EVERY DAY 12/20/17   Azalee Course, PA  lidocaine (LIDODERM) 5 % Use up to 3 patches on skin daily as needed for pain 10/17/14   [provider]  metoprolol succinate (TOPROL-XL) 50 MG 24 hr tablet Take one tablet daily with food 12/06/17   Runell Gess, MD  morphine (MS CONTIN) 15 MG 12 hr tablet Take 15 mg by mouth See admin instructions. Take 15 mg by mouth in the morning with 30 mg to equal 45 mg dose 06/30/15   [provider]  morphine (MS CONTIN) 30 MG 12 hr tablet Take 30 mg by mouth See admin instructions. Takes 30 mg by mouth in the morning with 15 mg to equal 45 mg dose, 30 mg in the afternoon, and 30 mg at night    [provider]  nitroGLYCERIN (NITROSTAT) 0.4 MG SL tablet Place 1 tablet (0.4 mg total) under the tongue every 5 (five) minutes as needed for chest pain. 01/20/17   Azalee Course, PA  Nutritional Supplements (JUICE PLUS FIBRE PO) Take 2 capsules by mouth 2 (two) times daily.    [provider]  pantoprazole (PROTONIX) 40 MG tablet TAKE 1 TABLET DAILY 01/24/18   Salley Scarlet, MD  spironolactone (ALDACTONE) 25 MG tablet Take 0.5 tablets (12.5 mg total) by mouth daily. 11/11/17   Bensimhon, Bevelyn Buckles, MD  SYNTHROID 88 MCG tablet TAKE 1 TABLET DAILY BEFORE BREAKFAST 04/04/18   Salley Scarlet, MD  temazepam (RESTORIL) 15 MG capsule Take 1 capsule (15 mg total) by mouth at bedtime as needed for sleep. 12/29/17   Dorena Bodo, PA-C  traZODone (DESYREL) 50 MG tablet Take 25 mg by mouth at bedtime.  09/02/16   [provider]    Family History Family History  Problem Relation Age of Onset  . Arthritis Father        rheumatoid  . Hypertension Sister   . Cancer Sister   . Scoliosis Sister   . Arthritis Sister   . Osteoarthritis Mother   . Arthritis/Rheumatoid Sister   . Peripheral vascular disease Sister   . Arrhythmia Sister   . CAD Brother     Social History Social History   Tobacco Use  . Smoking status: Former Smoker     Years: 53.00    Types: Cigarettes    Last attempt to quit: 04/19/2017    Years since quitting: 0.9  . Smokeless tobacco: Never Used  Substance Use Topics  . Alcohol use: Yes    Alcohol/week: 5.0 standard drinks    Types: 5 Cans of beer per week  . Drug use: Yes    Types: Marijuana    Comment: h/o IVDA; "no IVDAsince the 1970s; last smoked pot ~ 10/2016"     Allergies   Other   Review of Systems Review of Systems  All other systems reviewed and are negative.    Physical Exam Updated Vital Signs BP  121/77   Pulse (!) 55   Temp (!) 97.5 F (36.4 C) (Oral)   Resp 18   Ht 5\' 10"  (1.778 m)   Wt 77.1 kg   SpO2 100%   BMI 24.39 kg/m   Physical Exam Vitals signs and nursing note reviewed.  Constitutional:      Appearance: He is well-developed.  HENT:     Head: Normocephalic and atraumatic.  Cardiovascular:     Rate and Rhythm: Normal rate and regular rhythm.     Comments: bradycardic Pulmonary:     Effort: Pulmonary effort is normal. No respiratory distress.     Breath sounds: Normal breath sounds.  Abdominal:     Palpations: Abdomen is soft.     Tenderness: There is no abdominal tenderness. There is no guarding or rebound.  Musculoskeletal:        General: No tenderness.  Skin:    General: Skin is warm and dry.  Neurological:     Mental Status: He is alert and oriented to person, place, and time.  Psychiatric:        Behavior: Behavior normal.      ED Treatments / Results  Labs (all labs ordered are listed, but only abnormal results are displayed) Labs Reviewed  COMPREHENSIVE METABOLIC PANEL - Abnormal; Notable for the following components:      Result Value   Glucose, Bld 151 (*)    Total Protein 6.3 (*)    AST 45 (*)    All other components within normal limits  CBC WITH DIFFERENTIAL/PLATELET - Abnormal; Notable for the following components:   Platelets 120 (*)    Lymphs Abs 0.5 (*)    All other components within normal limits  D-DIMER,  QUANTITATIVE (NOT AT [facility]) - Abnormal; Notable for the following components:   D-Dimer, Quant 0.72 (*)    All other components within normal limits  I-STAT TROPONIN, ED    EKG EKG Interpretation  Date/Time:  Saturday April 09 2018 12:53:14 EST Ventricular Rate:  53 PR Interval:    QRS Duration: 105 QT Interval:  449 QTC Calculation: 422 R Axis:   6 Text Interpretation:  Sinus rhythm Ventricular premature complex Anteroseptal infarct, age indeterminate Confirmed by Tilden Fossa [phone]) on 04/09/2018 1:21:57 PM   Radiology Dg Chest 2 View  Result Date: 04/09/2018 CLINICAL DATA:  Right chest pain and shortness of breath. EXAM: CHEST - 2 VIEW COMPARISON:  March 25, 2017 FINDINGS: The heart size and mediastinal contours are stable. Cardiac pacemaker is unchanged. Mild opacity is identified in the left lung base at least in part due to atelectasis but developing pneumonia is not excluded. The right lung is clear. The visualized skeletal structures are stable. IMPRESSION: Mild opacity is identified in the left lung base at least in part due to atelectasis but developing pneumonia is not excluded. Electronically Signed   By: Sherian Rein M.D.   On: 04/09/2018 13:39   Ct Angio Chest Pe W/cm &/or Wo Cm  Result Date: 04/09/2018 CLINICAL DATA:  Right sided upper quadrant to lower abdominal pain with nausea since this morning. EXAM: CT ANGIOGRAPHY CHEST CT ABDOMEN AND PELVIS WITH CONTRAST TECHNIQUE: Multidetector CT imaging of the chest was performed using the standard protocol during bolus administration of intravenous contrast. Multiplanar CT image reconstructions and MIPs were obtained to evaluate the vascular anatomy. Multidetector CT imaging of the abdomen and pelvis was performed using the standard protocol during bolus administration of intravenous contrast. CONTRAST:  ISOVUE-370 IOPAMIDOL (ISOVUE-370) INJECTION  76% COMPARISON:  CT abdomen pelvis, 05/20/2004 FINDINGS: CTA CHEST  FINDINGS Cardiovascular: There is satisfactory opacification of the pulmonary arteries to the segmental level. There is no evidence of a pulmonary embolism. Heart is top-normal in size. No pericardial effusion. There are dense three-vessel coronary artery calcifications. Great vessels are normal in caliber. Mild aortic atherosclerosis. Mediastinum/Nodes: No neck base, axillary, mediastinal or hilar masses or enlarged lymph nodes. Trachea and esophagus are unremarkable. Lungs/Pleura: There is dependent opacity in the lower lobes and in the posterior inferior left upper lobe lingula, most likely atelectasis. 3 mm subpleural nodule, lateral right middle lobe, image 67, series 7, without significant change from the 2006 CT. No other nodules. There is no evidence of pulmonary edema. No pleural effusion or pneumothorax. Musculoskeletal: No fracture or acute finding. No osteoblastic or osteolytic lesions. Review of the MIP images confirms the above findings. CT ABDOMEN and PELVIS FINDINGS Hepatobiliary: Liver demonstrates morphologic changes of cirrhosis, overall decreased size, with central volume loss and relative enlargement of the lateral segment of the left lobe and caudate lobe as well as nodularity. No discrete liver mass. Gallbladder is distended density suggested in the gallbladder neck which may reflect a small stone. There is no wall thickening. No pericholecystic fluid. No bile duct dilation. Pancreas: Unremarkable. No pancreatic ductal dilatation or surrounding inflammatory changes. Spleen: Spleen mildly enlarged measuring 14 x 5 x 11 cm. No splenic mass or focal lesion. Adrenals/Urinary Tract: No adrenal masses. Two small low-density masses, 6 mm, lower pole right and 5 mm, midpole left, consistent with small cysts or angiomyolipomas. No other renal masses. Nonobstructing stone midpole the left kidney. No hydronephrosis. Normal ureters. Normal bladder. Stomach/Bowel: Stomach is unremarkable. Small bowel is  normal caliber. No wall thickening or inflammation. There are multiple left colon diverticula mostly along the sigmoid. No diverticulitis. No colon wall thickening or other inflammatory process. Normal appendix visualized. Vascular/Lymphatic: Dense aortic atherosclerosis. No aneurysm. There are prominent to mildly enlarged lymph nodes along the gastrohepatic ligament. Reproductive: Unremarkable Other: No hernia or ascites. Musculoskeletal: No fracture or acute finding. No osteoblastic or osteolytic lesions. Degenerative changes noted throughout the visualized spine. Review of the MIP images confirms the above findings. IMPRESSION: CTA CHEST 1. No evidence of a pulmonary embolism. 2. Dependent opacity in the lower lobes most consistent with atelectasis. Consider a component of pneumonia if there are consistent clinical findings. No evidence of pulmonary edema. 3. Coronary artery calcifications and aortic atherosclerosis. CT ABDOMEN AND PELVIS 1. Gallbladder is distended and there is a possible stone in the gallbladder neck, but there is no wall thickening or adjacent inflammation. If clinical findings strongly support acute cholecystitis, consider follow-up right upper quadrant ultrasound for further assessment. 2. Cirrhosis.  Mild splenomegaly. 3. Mild gastrohepatic ligament adenopathy, presumed reactive in the setting of cirrhosis. 4. Dense aortic atherosclerosis. 5. Left colon diverticula without diverticulitis. Electronically Signed   By: Amie Portland M.D.   On: 04/09/2018 14:22   Ct Abdomen Pelvis W Contrast  Result Date: 04/09/2018 CLINICAL DATA:  Right sided upper quadrant to lower abdominal pain with nausea since this morning. EXAM: CT ANGIOGRAPHY CHEST CT ABDOMEN AND PELVIS WITH CONTRAST TECHNIQUE: Multidetector CT imaging of the chest was performed using the standard protocol during bolus administration of intravenous contrast. Multiplanar CT image reconstructions and MIPs were obtained to evaluate the  vascular anatomy. Multidetector CT imaging of the abdomen and pelvis was performed using the standard protocol during bolus administration of intravenous contrast. CONTRAST:  ISOVUE-370 IOPAMIDOL (ISOVUE-370)  INJECTION 76% COMPARISON:  CT abdomen pelvis, 05/20/2004 FINDINGS: CTA CHEST FINDINGS Cardiovascular: There is satisfactory opacification of the pulmonary arteries to the segmental level. There is no evidence of a pulmonary embolism. Heart is top-normal in size. No pericardial effusion. There are dense three-vessel coronary artery calcifications. Great vessels are normal in caliber. Mild aortic atherosclerosis. Mediastinum/Nodes: No neck base, axillary, mediastinal or hilar masses or enlarged lymph nodes. Trachea and esophagus are unremarkable. Lungs/Pleura: There is dependent opacity in the lower lobes and in the posterior inferior left upper lobe lingula, most likely atelectasis. 3 mm subpleural nodule, lateral right middle lobe, image 67, series 7, without significant change from the 2006 CT. No other nodules. There is no evidence of pulmonary edema. No pleural effusion or pneumothorax. Musculoskeletal: No fracture or acute finding. No osteoblastic or osteolytic lesions. Review of the MIP images confirms the above findings. CT ABDOMEN and PELVIS FINDINGS Hepatobiliary: Liver demonstrates morphologic changes of cirrhosis, overall decreased size, with central volume loss and relative enlargement of the lateral segment of the left lobe and caudate lobe as well as nodularity. No discrete liver mass. Gallbladder is distended density suggested in the gallbladder neck which may reflect a small stone. There is no wall thickening. No pericholecystic fluid. No bile duct dilation. Pancreas: Unremarkable. No pancreatic ductal dilatation or surrounding inflammatory changes. Spleen: Spleen mildly enlarged measuring 14 x 5 x 11 cm. No splenic mass or focal lesion. Adrenals/Urinary Tract: No adrenal masses. Two small  low-density masses, 6 mm, lower pole right and 5 mm, midpole left, consistent with small cysts or angiomyolipomas. No other renal masses. Nonobstructing stone midpole the left kidney. No hydronephrosis. Normal ureters. Normal bladder. Stomach/Bowel: Stomach is unremarkable. Small bowel is normal caliber. No wall thickening or inflammation. There are multiple left colon diverticula mostly along the sigmoid. No diverticulitis. No colon wall thickening or other inflammatory process. Normal appendix visualized. Vascular/Lymphatic: Dense aortic atherosclerosis. No aneurysm. There are prominent to mildly enlarged lymph nodes along the gastrohepatic ligament. Reproductive: Unremarkable Other: No hernia or ascites. Musculoskeletal: No fracture or acute finding. No osteoblastic or osteolytic lesions. Degenerative changes noted throughout the visualized spine. Review of the MIP images confirms the above findings. IMPRESSION: CTA CHEST 1. No evidence of a pulmonary embolism. 2. Dependent opacity in the lower lobes most consistent with atelectasis. Consider a component of pneumonia if there are consistent clinical findings. No evidence of pulmonary edema. 3. Coronary artery calcifications and aortic atherosclerosis. CT ABDOMEN AND PELVIS 1. Gallbladder is distended and there is a possible stone in the gallbladder neck, but there is no wall thickening or adjacent inflammation. If clinical findings strongly support acute cholecystitis, consider follow-up right upper quadrant ultrasound for further assessment. 2. Cirrhosis.  Mild splenomegaly. 3. Mild gastrohepatic ligament adenopathy, presumed reactive in the setting of cirrhosis. 4. Dense aortic atherosclerosis. 5. Left colon diverticula without diverticulitis. Electronically Signed   By: Amie Portlandavid  Ormond M.D.   On: 04/09/2018 14:22    Procedures Procedures (including critical care time)  Medications Ordered in ED Medications  cefTRIAXone (ROCEPHIN) 2 g in sodium chloride  0.9 % 100 mL IVPB (has no administration in time range)  morphine 4 MG/ML injection 4 mg (4 mg Intravenous Given 04/09/18 1246)  ondansetron (ZOFRAN) injection 4 mg (4 mg Intravenous Given 04/09/18 1302)  iopamidol (ISOVUE-370) 76 % injection (100 mLs  Contrast Given 04/09/18 1345)     Initial Impression / Assessment and Plan / ED Course  I have reviewed the triage vital signs and the  nursing notes.  Pertinent labs & imaging results that were available during my care of the patient were reviewed by me and considered in my medical decision making (see chart for details).     Pt here for evaluation of right mid axillary pain/right upper quadrant pain that began this morning. No appreciable tenderness on initial assessment. Initial EKG with borderline ST elevation in leads V1 through V3, similar when compared to priors. CT with possible cholecystitis, possible pneumonia. He was treated with antibiotics pending further workup. On repeat assessment he does have mild right upper quadrant tenderness with negative Murphy's. Right upper quadrant ultrasound obtained to further evaluate. Patient care transferred pending ultrasound.  Final Clinical Impressions(s) / ED Diagnoses   Final diagnoses:  RUQ abdominal pain    ED Discharge Orders    None       Tilden Fossa, MD 04/09/18 1549

## 2018-04-09 NOTE — Progress Notes (Signed)
Pt admitted from [facility] at 1835hrs. meds not yet verified by pharmacy

## 2018-04-09 NOTE — H&P (Addendum)
History and Physical    Tommy Robbins OBS:[id number] DOB: [birth year] DOA: 04/09/2018  PCP: Dorena Bodo, PA-C   Patient coming from: Home   Chief Complaint: Right upper quadrant abdominal pain with nausea.   HPI: Tommy Robbins is a 73 y.o. male with medical history significant of hypertension and coronary disease s/p ventricular fibrillation cardiac arrest, as well as AICD.  Patient presents with right upper quad abdominal pain, for the last about 8 hours, persistent, dull in nature, 10 out of 10 in intensity, no radiation, associated with nausea but no vomiting, no improving or worsening factors.  He was able to eat breakfast this morning, no diarrhea or bowel movement.  His symptoms were preceded by 24 hours of nausea.    ED Course: Patient was evaluated with CT of the abdomen and pelvis, plus abdominal ultrasonography.  Suggesting acute cholecystitis due to gallbladder distention, possible stone but there was no gallbladder thickening.   Review of Systems:  1. General: No fevers, no chills, no weight gain or weight loss 2. ENT: No runny nose or sore throat, no hearing disturbances 3. Pulmonary: No dyspnea, cough, wheezing, or hemoptysis 4. Cardiovascular: No angina, claudication, lower extremity edema, pnd or orthopnea 5. Gastrointestinal: Positive for nausea, but vomiting, no diarrhea or constipation 6. Hematology: No easy bruisability or frequent infections 7. Urology: No dysuria, hematuria or increased urinary frequency 8. Dermatology: No rashes. 9. Neurology: No seizures or paresthesias 10. Musculoskeletal: No joint pain or deformities  Past Medical History:  Diagnosis Date  . AICD (automatic cardioverter/defibrillator) present 03/24/2017  . Cataract   . Cellulitis and abscess of right leg 10/06/2016   right thigh  . Chronic low back pain   . Colon polyps   . Depression   . Diverticulosis   . GERD (gastroesophageal reflux disease)   . Hepatitis C   . History of sudden  cardiac arrest 10/25/2016   witness/notes 02/10/2017  . Hx of substance abuse (HCC)    Tommy Robbins 02/10/2017  . Hypertension   . Insomnia   . Thyroid disease    hypothyroid    Past Surgical History:  Procedure Laterality Date  . CARDIAC CATHETERIZATION    . CARDIAC DEFIBRILLATOR PLACEMENT  03/24/2017  . IABP INSERTION N/A 10/24/2016   Procedure: IABP Insertion;  Surgeon: Runell Gess, MD;  Location: [facility] INVASIVE CV LAB;  Service: Cardiovascular;  Laterality: N/A;  . ICD IMPLANT N/A 03/24/2017   [facility] [place] MRI XT DR ICD implanted by Dr Johney Frame for secondary prevention (VF arrest)  . JOINT REPLACEMENT    . LEFT HEART CATH AND CORONARY ANGIOGRAPHY N/A 10/24/2016   Procedure: LEFT HEART CATH AND CORONARY ANGIOGRAPHY;  Surgeon: Runell Gess, MD;  Location: [facility] INVASIVE CV LAB;  Service: Cardiovascular;  Laterality: N/A;  . TOTAL SHOULDER REPLACEMENT Right 03/23/2008     reports that he quit smoking about a year ago. His smoking use included cigarettes. He quit after 53.00 years of use. He has never used smokeless tobacco. He reports current alcohol use of about 5.0 standard drinks of alcohol per week. He reports current drug use. Drug: Marijuana.  Allergies  Allergen Reactions  . Other Other (See Comments)    Seasonal allergies - sniffling     Family History  Problem Relation Age of Onset  . Arthritis Father        rheumatoid  . Hypertension Sister   . Cancer Sister   . Scoliosis Sister   . Arthritis Sister   .  Osteoarthritis Mother   . Arthritis/Rheumatoid Sister   . Peripheral vascular disease Sister   . Arrhythmia Sister   . CAD Brother      Prior to Admission medications   Medication Sig Start Date End Date Taking? Authorizing Provider  aspirin EC 81 MG tablet Take 81 mg by mouth daily.   Yes [provider]  atorvastatin (LIPITOR) 80 MG tablet TAKE 1 TABLET DAILY AT 6PM Patient taking differently: Take 80 mg by mouth every evening.  08/18/17  Yes [place],  Velna Hatchet, MD  cycloSPORINE (RESTASIS) 0.05 % ophthalmic emulsion Place 1 drop into both eyes 2 (two) times daily.   Yes [provider]  diclofenac sodium (VOLTAREN) 1 % GEL Apply 1 application topically daily as needed (for pain).   Yes [provider]  ENTRESTO 24-26 MG TAKE 1 TABLET BY MOUTH TWICE A DAY Patient taking differently: Take 1 tablet by mouth 2 (two) times daily.  02/23/18  Yes Bensimhon, Bevelyn Buckles, MD  furosemide (LASIX) 20 MG tablet Take 1 tablet (20 mg total) by mouth daily as needed. Patient taking differently: Take 20 mg by mouth daily as needed for fluid or edema.  01/20/17 11/11/18 Yes Azalee Course, PA  gabapentin (NEURONTIN) 100 MG capsule Take 200 mg by mouth at bedtime.  03/03/17  Yes [provider]  isosorbide mononitrate (IMDUR) 30 MG 24 hr tablet TAKE 1 TABLET BY MOUTH EVERY DAY Patient taking differently: Take 30 mg by mouth daily.  12/20/17  Yes Azalee Course, PA  lidocaine (LIDODERM) 5 % Place 3 patches onto the skin daily as needed (pain).  10/17/14  Yes [provider]  metoprolol succinate (TOPROL-XL) 50 MG 24 hr tablet Take one tablet daily with food Patient taking differently: Take 50 mg by mouth daily. Take one tablet daily with food 12/06/17  Yes Runell Gess, MD  morphine (MS CONTIN) 15 MG 12 hr tablet Take 15 mg by mouth See admin instructions. Take 15 mg by mouth in the morning with 30 mg to equal 45 mg dose 06/30/15  Yes [provider]  morphine (MS CONTIN) 30 MG 12 hr tablet Take 30 mg by mouth See admin instructions. Takes 30 mg by mouth in the morning with 15 mg to equal 45 mg dose, 30 mg in the afternoon, and 30 mg at night   Yes [provider]  nitroGLYCERIN (NITROSTAT) 0.4 MG SL tablet Place 1 tablet (0.4 mg total) under the tongue every 5 (five) minutes as needed for chest pain. 01/20/17  Yes Azalee Course, PA  Nutritional Supplements (JUICE PLUS FIBRE PO) Take 2 capsules by mouth 2 (two) times daily.   Yes  [provider]  pantoprazole (PROTONIX) 40 MG tablet TAKE 1 TABLET DAILY Patient taking differently: Take 40 mg by mouth every other day.  01/24/18  Yes [place], Velna Hatchet, MD  spironolactone (ALDACTONE) 25 MG tablet Take 0.5 tablets (12.5 mg total) by mouth daily. 11/11/17  Yes Bensimhon, Bevelyn Buckles, MD  SYNTHROID 88 MCG tablet TAKE 1 TABLET DAILY BEFORE BREAKFAST Patient taking differently: Take 88 mcg by mouth daily before breakfast.  04/04/18  Yes [place], Velna Hatchet, MD  temazepam (RESTORIL) 15 MG capsule Take 1 capsule (15 mg total) by mouth at bedtime as needed for sleep. Patient taking differently: Take 15 mg by mouth every other day.  12/29/17  Yes Dorena Bodo, PA-C  traZODone (DESYREL) 50 MG tablet Take 25 mg by mouth at bedtime.  09/02/16  Yes [provider]    Physical Exam: Vitals:   04/09/18 1549 04/09/18 1550 04/09/18 1551 04/09/18 1553  BP:  (!) 141/77    Pulse: (!) 50 (!) 52 (!) 50 (!) 53  Resp: 15 12 12 12   Temp:      TempSrc:      SpO2: 97% 97% 97% 95%  Weight:      Height:        Vitals:   04/09/18 1549 04/09/18 1550 04/09/18 1551 04/09/18 1553  BP:  (!) 141/77    Pulse: (!) 50 (!) 52 (!) 50 (!) 53  Resp: 15 12 12 12   Temp:      TempSrc:      SpO2: 97% 97% 97% 95%  Weight:      Height:       General: deconditioned and in pain.  Neurology: Awake and alert, non focal Head and Neck. Head normocephalic. Neck supple with no adenopathy or thyromegaly.   E ENT: positive pallor, no icterus, oral mucosa dry.  Cardiovascular: No JVD. S1-S2 present, rhythmic, no gallops, rubs, or murmurs. No lower extremity edema. Pulmonary: positive breath sounds bilaterally, adequate air movement, no wheezing, rhonchi or rales. Gastrointestinal. Abdomen distended, tender to deep and superficial right upper quadrant, positive Murphy sign, with  no organomegaly. No rebound but positive guarding.  Skin. No rashes Musculoskeletal: no joint deformities    Labs on  Admission: I have personally reviewed following labs and imaging studies  CBC: Recent Labs  Lab 04/09/18 1240  WBC 5.4  NEUTROABS 4.2  HGB 14.6  HCT 43.4  MCV 92.3  PLT 120*   Basic Metabolic Panel: Recent Labs  Lab 04/09/18 1240  NA 136  K 4.1  CL 102  CO2 23  GLUCOSE 151*  BUN 19  CREATININE 1.14  CALCIUM 9.0   GFR: Estimated Creatinine Clearance: 61.4 mL/min (by C-G formula based on SCr of 1.14 mg/dL). Liver Function Tests: Recent Labs  Lab 04/09/18 1240  AST 45*  ALT 41  ALKPHOS 82  BILITOT 0.7  PROT 6.3*  ALBUMIN 3.7   No results for input(s): LIPASE, AMYLASE in the last 168 hours. No results for input(s): AMMONIA in the last 168 hours. Coagulation Profile: No results for input(s): INR, PROTIME in the last 168 hours. Cardiac Enzymes: No results for input(s): CKTOTAL, CKMB, CKMBINDEX, TROPONINI in the last 168 hours. BNP (last 3 results) No results for input(s): PROBNP in the last 8760 hours. HbA1C: No results for input(s): HGBA1C in the last 72 hours. CBG: No results for input(s): GLUCAP in the last 168 hours. Lipid Profile: No results for input(s): CHOL, HDL, LDLCALC, TRIG, CHOLHDL, LDLDIRECT in the last 72 hours. Thyroid Function Tests: No results for input(s): TSH, T4TOTAL, FREET4, T3FREE, THYROIDAB in the last 72 hours. Anemia Panel: No results for input(s): VITAMINB12, FOLATE, FERRITIN, TIBC, IRON, RETICCTPCT in the last 72 hours. Urine analysis:    Component Value Date/Time   COLORURINE YELLOW 02/27/2009 1321   APPEARANCEUR CLEAR 02/27/2009 1321   LABSPEC 1.018 02/27/2009 1321   PHURINE 7.0 02/27/2009 1321   GLUCOSEU NEGATIVE 02/27/2009 1321   HGBUR NEGATIVE 02/27/2009 1321   BILIRUBINUR NEGATIVE 02/27/2009 1321   KETONESUR NEGATIVE 02/27/2009 1321   PROTEINUR NEGATIVE 02/27/2009 1321   UROBILINOGEN 1.0 02/27/2009 1321   NITRITE NEGATIVE 02/27/2009 1321   LEUKOCYTESUR SMALL (A) 02/27/2009 1321    Radiological Exams on Admission: Dg  Chest 2 View  Result Date: 04/09/2018 CLINICAL DATA:  Right chest pain and shortness of  breath. EXAM: CHEST - 2 VIEW COMPARISON:  March 25, 2017 FINDINGS: The heart size and mediastinal contours are stable. Cardiac pacemaker is unchanged. Mild opacity is identified in the left lung base at least in part due to atelectasis but developing pneumonia is not excluded. The right lung is clear. The visualized skeletal structures are stable. IMPRESSION: Mild opacity is identified in the left lung base at least in part due to atelectasis but developing pneumonia is not excluded. Electronically Signed   By: Sherian Rein M.D.   On: 04/09/2018 13:39   Ct Angio Chest Pe W/cm &/or Wo Cm  Result Date: 04/09/2018 CLINICAL DATA:  Right sided upper quadrant to lower abdominal pain with nausea since this morning. EXAM: CT ANGIOGRAPHY CHEST CT ABDOMEN AND PELVIS WITH CONTRAST TECHNIQUE: Multidetector CT imaging of the chest was performed using the standard protocol during bolus administration of intravenous contrast. Multiplanar CT image reconstructions and MIPs were obtained to evaluate the vascular anatomy. Multidetector CT imaging of the abdomen and pelvis was performed using the standard protocol during bolus administration of intravenous contrast. CONTRAST:  ISOVUE-370 IOPAMIDOL (ISOVUE-370) INJECTION 76% COMPARISON:  CT abdomen pelvis, 05/20/2004 FINDINGS: CTA CHEST FINDINGS Cardiovascular: There is satisfactory opacification of the pulmonary arteries to the segmental level. There is no evidence of a pulmonary embolism. Heart is top-normal in size. No pericardial effusion. There are dense three-vessel coronary artery calcifications. Great vessels are normal in caliber. Mild aortic atherosclerosis. Mediastinum/Nodes: No neck base, axillary, mediastinal or hilar masses or enlarged lymph nodes. Trachea and esophagus are unremarkable. Lungs/Pleura: There is dependent opacity in the lower lobes and in the posterior  inferior left upper lobe lingula, most likely atelectasis. 3 mm subpleural nodule, lateral right middle lobe, image 67, series 7, without significant change from the 2006 CT. No other nodules. There is no evidence of pulmonary edema. No pleural effusion or pneumothorax. Musculoskeletal: No fracture or acute finding. No osteoblastic or osteolytic lesions. Review of the MIP images confirms the above findings. CT ABDOMEN and PELVIS FINDINGS Hepatobiliary: Liver demonstrates morphologic changes of cirrhosis, overall decreased size, with central volume loss and relative enlargement of the lateral segment of the left lobe and caudate lobe as well as nodularity. No discrete liver mass. Gallbladder is distended density suggested in the gallbladder neck which may reflect a small stone. There is no wall thickening. No pericholecystic fluid. No bile duct dilation. Pancreas: Unremarkable. No pancreatic ductal dilatation or surrounding inflammatory changes. Spleen: Spleen mildly enlarged measuring 14 x 5 x 11 cm. No splenic mass or focal lesion. Adrenals/Urinary Tract: No adrenal masses. Two small low-density masses, 6 mm, lower pole right and 5 mm, midpole left, consistent with small cysts or angiomyolipomas. No other renal masses. Nonobstructing stone midpole the left kidney. No hydronephrosis. Normal ureters. Normal bladder. Stomach/Bowel: Stomach is unremarkable. Small bowel is normal caliber. No wall thickening or inflammation. There are multiple left colon diverticula mostly along the sigmoid. No diverticulitis. No colon wall thickening or other inflammatory process. Normal appendix visualized. Vascular/Lymphatic: Dense aortic atherosclerosis. No aneurysm. There are prominent to mildly enlarged lymph nodes along the gastrohepatic ligament. Reproductive: Unremarkable Other: No hernia or ascites. Musculoskeletal: No fracture or acute finding. No osteoblastic or osteolytic lesions. Degenerative changes noted throughout the  visualized spine. Review of the MIP images confirms the above findings. IMPRESSION: CTA CHEST 1. No evidence of a pulmonary embolism. 2. Dependent opacity in the lower lobes most consistent with atelectasis. Consider a component of pneumonia if there are consistent clinical  findings. No evidence of pulmonary edema. 3. Coronary artery calcifications and aortic atherosclerosis. CT ABDOMEN AND PELVIS 1. Gallbladder is distended and there is a possible stone in the gallbladder neck, but there is no wall thickening or adjacent inflammation. If clinical findings strongly support acute cholecystitis, consider follow-up right upper quadrant ultrasound for further assessment. 2. Cirrhosis.  Mild splenomegaly. 3. Mild gastrohepatic ligament adenopathy, presumed reactive in the setting of cirrhosis. 4. Dense aortic atherosclerosis. 5. Left colon diverticula without diverticulitis. Electronically Signed   By: Amie Portlandavid  Ormond M.D.   On: 04/09/2018 14:22   Ct Abdomen Pelvis W Contrast  Result Date: 04/09/2018 CLINICAL DATA:  Right sided upper quadrant to lower abdominal pain with nausea since this morning. EXAM: CT ANGIOGRAPHY CHEST CT ABDOMEN AND PELVIS WITH CONTRAST TECHNIQUE: Multidetector CT imaging of the chest was performed using the standard protocol during bolus administration of intravenous contrast. Multiplanar CT image reconstructions and MIPs were obtained to evaluate the vascular anatomy. Multidetector CT imaging of the abdomen and pelvis was performed using the standard protocol during bolus administration of intravenous contrast. CONTRAST:  100mL ISOVUE-370 IOPAMIDOL (ISOVUE-370) INJECTION 76% COMPARISON:  CT abdomen pelvis, 05/20/2004 FINDINGS: CTA CHEST FINDINGS Cardiovascular: There is satisfactory opacification of the pulmonary arteries to the segmental level. There is no evidence of a pulmonary embolism. Heart is top-normal in size. No pericardial effusion. There are dense three-vessel coronary artery  calcifications. Great vessels are normal in caliber. Mild aortic atherosclerosis. Mediastinum/Nodes: No neck base, axillary, mediastinal or hilar masses or enlarged lymph nodes. Trachea and esophagus are unremarkable. Lungs/Pleura: There is dependent opacity in the lower lobes and in the posterior inferior left upper lobe lingula, most likely atelectasis. 3 mm subpleural nodule, lateral right middle lobe, image 67, series 7, without significant change from the 2006 CT. No other nodules. There is no evidence of pulmonary edema. No pleural effusion or pneumothorax. Musculoskeletal: No fracture or acute finding. No osteoblastic or osteolytic lesions. Review of the MIP images confirms the above findings. CT ABDOMEN and PELVIS FINDINGS Hepatobiliary: Liver demonstrates morphologic changes of cirrhosis, overall decreased size, with central volume loss and relative enlargement of the lateral segment of the left lobe and caudate lobe as well as nodularity. No discrete liver mass. Gallbladder is distended density suggested in the gallbladder neck which may reflect a small stone. There is no wall thickening. No pericholecystic fluid. No bile duct dilation. Pancreas: Unremarkable. No pancreatic ductal dilatation or surrounding inflammatory changes. Spleen: Spleen mildly enlarged measuring 14 x 5 x 11 cm. No splenic mass or focal lesion. Adrenals/Urinary Tract: No adrenal masses. Two small low-density masses, 6 mm, lower pole right and 5 mm, midpole left, consistent with small cysts or angiomyolipomas. No other renal masses. Nonobstructing stone midpole the left kidney. No hydronephrosis. Normal ureters. Normal bladder. Stomach/Bowel: Stomach is unremarkable. Small bowel is normal caliber. No wall thickening or inflammation. There are multiple left colon diverticula mostly along the sigmoid. No diverticulitis. No colon wall thickening or other inflammatory process. Normal appendix visualized. Vascular/Lymphatic: Dense aortic  atherosclerosis. No aneurysm. There are prominent to mildly enlarged lymph nodes along the gastrohepatic ligament. Reproductive: Unremarkable Other: No hernia or ascites. Musculoskeletal: No fracture or acute finding. No osteoblastic or osteolytic lesions. Degenerative changes noted throughout the visualized spine. Review of the MIP images confirms the above findings. IMPRESSION: CTA CHEST 1. No evidence of a pulmonary embolism. 2. Dependent opacity in the lower lobes most consistent with atelectasis. Consider a component of pneumonia if there are consistent clinical  findings. No evidence of pulmonary edema. 3. Coronary artery calcifications and aortic atherosclerosis. CT ABDOMEN AND PELVIS 1. Gallbladder is distended and there is a possible stone in the gallbladder neck, but there is no wall thickening or adjacent inflammation. If clinical findings strongly support acute cholecystitis, consider follow-up right upper quadrant ultrasound for further assessment. 2. Cirrhosis.  Mild splenomegaly. 3. Mild gastrohepatic ligament adenopathy, presumed reactive in the setting of cirrhosis. 4. Dense aortic atherosclerosis. 5. Left colon diverticula without diverticulitis. Electronically Signed   By: Amie Portland M.D.   On: 04/09/2018 14:22   US Abdomen Limited Ruq  Result Date: 04/09/2018 CLINICAL DATA:  Right upper quadrant pain. EXAM: ULTRASOUND ABDOMEN LIMITED RIGHT UPPER QUADRANT COMPARISON:  None. FINDINGS: Gallbladder: The gallbladder is distended without wall thickening. The wall measures 2 mm. There may be a tiny amount of pericholecystic fluid. A positive Murphy's sign is reported. There is sludge in the gallbladder with no stones. Common bile duct: Diameter: 6.3 mm Liver: A nodular contour and heterogeneous echotexture of the liver is consistent with known cirrhosis. No focal mass. The umbilical vein is recannulized. The portal vein is patent but appears to contain bidirectional flow. IMPRESSION: 1. Findings  associated with the gallbladder are mixed. Gallbladder distension, gallbladder sludge, a positive Murphy's sign, and the possibility of minimal pericholecystic fluid all raise the possibility of acute cholecystitis. However, there is no wall thickening or stones. A HIDA scan could further evaluate as clinically warranted. 2. Cirrhotic liver. 3. Recannulization of the umbilical vein and bidirectional flow in the portal vein are consistent with portal venous hypertension. 4. The common bile duct is borderline in caliber. Recommend correlation with labs. Electronically Signed   By: Gerome Sam III M.D   On: 04/09/2018 15:58    EKG: Independently reviewed.  EKG sinus rhythm, left axis, low voltage, positive PVCs, septal Q waves.  Chronic changes.  Assessment/Plan Active Problems:   Abdominal pain  73 year old male with history of ischemic heart disease status post AICD who presents with 8 hours of right upper quad abdominal pain preceded by 24 hours of nausea.  No fevers, no chills.  On his initial physical examination temperature 97.5, blood pressure 148/77, heart rate 51, respiratory rate 18, oxygen saturation 98%.  He had dry mucous membranes, his lungs were clear to auscultation, heart S1-S2 present and rhythmic, no S3 or S4 gallop, abdomen distended, tender at the right upper quadrant, superficial and deep palpation, positive Murphy sign and mild guarding, no rebound.  His sodium was 136, potassium 4.1, chloride 102, bicarb 23, glucose 151, BUN 19, creatinine 1.1, AST 45, ALT 41, total bilirubin 0.7, white count 5.4, hemoglobin 14.6, hematocrit 43.4, platelet 120.  His CT of the chest was negative for acute findings, no pulmonary embolism.  Gallbladder was distended with possible stone in the gallbladder neck.  No wall thickening or adjacent inflammation.  Positive cirrhosis.  Chest x-ray with left lower lobe atelectasis.  AICD in place.  Patient will be admitted to the hospital with a working  diagnosis of acute cholecystitis.  1.  Acute cholecystitis.  Patient has typical signs and symptoms of gallbladder disease including right upper quadrant abdominal pain, associated with nausea, he does have a Murphy sign positive, and his gallbladder is distended, possible gallbladder stone per CT, no frank gallbladder wall thickening but the pretest probability for cholecystitis is high.  Patient will be placed on IV antibiotic therapy with Zosyn, gentle hydration with saline, will keep him nothing by mouth,  IV  antiacids with famotidine and as needed analgesics and antiemetics.  Surgery will be consulted.  2.  Ischemic cardiomyopathy status post AICD/ with chronic systolic heart failure.  No current chest pain, will continue isosorbide, metoprolol, and Entresto. Clinically patient euvolemic, hold on furosemide.  His left ventricle ejection fraction is 35 to 40%, per echocardiography July 2019. Will hydrate patient with saline, hold on spironolactone. He does have a high grade LAD stenosis, unable to intervene, he dose not report any symptoms, including angina or heart failure, seems to be able to do his routine activities of daily life with no significant impairment. Does not uses any aid for ambulation.   3.  Hypothyroidism.  Continue levothyroxine.  4.  Depression.  Continue trazodone.  DVT prophylaxis: heparin  Code Status: full  Family Communication: I spoke with patient's family at the bedside and all questions were addressed.   Disposition Plan:  Med surg  Consults called:  Surgery.  Admission status: inpatient.    Late entry: Case discussed with Dr. Cliffton Asters from surgery, patient seems to be high risk due to recent untreated diffuse coronary artery disease.  Will proceed further testing with HIDA scan, will consult cardiology in a.m. for further preop cardiovascular risk stratification.  I will keep patient n.p.o. and with IV antibiotics.  Patient may need a percutaneous drain as an  alternative for surgery.   Lindsee Labarre Annett Gula MD [facility] Pager [phone]  If 7PM-7AM, please contact night-coverage [email or web address] Password [email or web address]  04/09/2018, 4:11 PM

## 2018-04-10 ENCOUNTER — Encounter (HOSPITAL_COMMUNITY): Payer: Self-pay | Admitting: Cardiology

## 2018-04-10 ENCOUNTER — Other Ambulatory Visit: Payer: Self-pay

## 2018-04-10 ENCOUNTER — Inpatient Hospital Stay (HOSPITAL_COMMUNITY): Payer: BC Managed Care – PPO

## 2018-04-10 DIAGNOSIS — I255 Ischemic cardiomyopathy: Secondary | ICD-10-CM

## 2018-04-10 DIAGNOSIS — Z01818 Encounter for other preprocedural examination: Secondary | ICD-10-CM

## 2018-04-10 DIAGNOSIS — I2510 Atherosclerotic heart disease of native coronary artery without angina pectoris: Secondary | ICD-10-CM

## 2018-04-10 LAB — BASIC METABOLIC PANEL
Anion gap: 11 (ref 5–15)
BUN: 17 mg/dL (ref 8–23)
CO2: 21 mmol/L — ABNORMAL LOW (ref 22–32)
Calcium: 8.8 mg/dL — ABNORMAL LOW (ref 8.9–10.3)
Chloride: 104 mmol/L (ref 98–111)
Creatinine, Ser: 0.95 mg/dL (ref 0.61–1.24)
GFR calc Af Amer: >60 mL/min (ref >60)
GFR calc non Af Amer: >60 mL/min (ref >60)
Glucose, Bld: 115 mg/dL — ABNORMAL HIGH (ref 70–99)
Potassium: 5 mmol/L (ref 3.5–5.1)
Sodium: 136 mmol/L (ref 135–145)

## 2018-04-10 LAB — CBC
HCT: 41.3 % (ref 39.0–52.0)
Hemoglobin: 13.8 g/dL (ref 13.0–17.0)
MCH: 29.9 pg (ref 26.0–34.0)
MCHC: 33.4 g/dL (ref 30.0–36.0)
MCV: 89.4 fL (ref 80.0–100.0)
Platelets: DECREASED 10*3/uL (ref 150–400)
RBC: 4.62 MIL/uL (ref 4.22–5.81)
RDW: 12.7 % (ref 11.5–15.5)
WBC: 8.9 10*3/uL (ref 4.0–10.5)
nRBC: 0 % (ref 0.0–0.2)

## 2018-04-10 LAB — LIPASE, BLOOD: Lipase: 32 U/L (ref 11–51)

## 2018-04-10 MED ORDER — TECHNETIUM TC 99M MEBROFENIN IV KIT
5.3800 | PACK | Freq: Once | INTRAVENOUS | Status: AC | PRN
  Administered 2018-04-10: 5.38 via INTRAVENOUS

## 2018-04-10 MED ORDER — MORPHINE SULFATE (PF) 4 MG/ML IV SOLN
3.0000 mg | Freq: Once | INTRAVENOUS | Status: AC
  Administered 2018-04-10: 3 mg via INTRAVENOUS
  Filled 2018-04-10: qty 1

## 2018-04-10 NOTE — Consult Note (Signed)
Cardiology Consultation:   Patient ID: Tommy Robbins MRN: [id number]; DOB: 04/27/1945  Admit date: 04/09/2018 Date of Consult: 04/10/2018  Primary Care Provider: Dorena Bodo, PA-C Primary Cardiologist: Nanetta Batty, MD  Primary Electrophysiologist:  Hillis Range, MD  CHF:   Mallie Snooks, MD    Patient Profile:   Tommy Robbins is a 73 y.o. male with a hx of ICM, EF 35-40%, NYHA 111-111B symptoms, CAD non revascularizable. ICD,  HTN, Hepatitis C, chronic back and shoulder pain who is being seen today for the evaluation of cardiac eval for possible cholecystectomy or percutaneous approach at the request of Dr. Daphine Deutscher.  History of Present Illness:   Tommy Robbins with complex cardiac hx, with witnessed sudden cardiac death on 11/10/2016.  CPR and spontaneous circulation after 17 min.  Emergent cath with 3 vessel disease, unsuccessful attempt to cross LAD CTO.  With no targets was not a candidate for CABG, he has been medically treated.  RCA is 100% totaled and 99% stenosis of LCX.  He did have ICD placed for secondary protection of sudden death.  MDT device 04/15/17.   Pt has been rel stable and has been seen in advanced heart failure clinic.  Seen this fall  Pt's activity mostly restricted by his chromic back and shoulder pain.  He climbs one set of stairs to be at home and does have some DOE and stops to rest at times. No chest pain.  Presents for Rt upper abd pain with associated N&V.  No chest pain. Feeling better today.   EKG:  The EKG was personally reviewed and demonstrates:  SB at 30 with ant sept MI old Q wave in III.  On some tracings possible ST elevation in inf leads vs placement.  Telemetry:  Telemetry was personally reviewed and demonstrates:  SR with rare PVC occ 3 beats non sustained VT.  tropoin poc neg 0.01   Na 136, k+ 5.0, Cr 0.95  HGB 13.8 WBC 8.9 plts 120  ddiner 0.72    CTA of chest and abd No evidence of a pulmonary embolism. 2. Dependent opacity in the lower lobes  most consistent with atelectasis. Consider a component of pneumonia if there are consistent clinical findings. No evidence of pulmonary edema. 3. Coronary artery calcifications and aortic atherosclerosis.  CT ABDOMEN AND PELVIS  1. Gallbladder is distended and there is a possible stone in the gallbladder neck, but there is no wall thickening or adjacent inflammation. If clinical findings strongly support acute cholecystitis, consider follow-up right upper quadrant ultrasound for further assessment. 2. Cirrhosis.  Mild splenomegaly. 3. Mild gastrohepatic ligament adenopathy, presumed reactive in the setting of cirrhosis. 4. Dense aortic atherosclerosis. 5. Left colon diverticula without diverticulitis.  Nuclear Hepatobiliary scan  IMPRESSION: 1. Gallbladder activity is visualized, indicative of patent cystic duct, which is not compatible with acute cholecystitis. 2. Patent common bile duct.  He does feel better today.  BP is stable to low 99/76 to 110/69   Past Medical History:  Diagnosis Date  . AICD (automatic cardioverter/defibrillator) present 03/24/2017  . Cataract   . Cellulitis and abscess of right leg 10/06/2016   right thigh  . Chronic low back pain   . Colon polyps   . Depression   . Diverticulosis   . GERD (gastroesophageal reflux disease)   . Hepatitis C   . History of sudden cardiac arrest 11-10-2016   witness/notes 02/10/2017  . Hx of substance abuse (HCC)    [name] 02/10/2017  . Hypertension   .  Insomnia   . Thyroid disease    hypothyroid    Past Surgical History:  Procedure Laterality Date  . CARDIAC CATHETERIZATION    . CARDIAC DEFIBRILLATOR PLACEMENT  03/24/2017  . IABP INSERTION N/A 10/24/2016   Procedure: IABP Insertion;  Surgeon: Runell Gess, MD;  Location: [facility] INVASIVE CV LAB;  Service: Cardiovascular;  Laterality: N/A;  . ICD IMPLANT N/A 03/24/2017   MDT [place] MRI XT DR ICD implanted by Dr Johney Frame for secondary prevention (VF arrest)    . JOINT REPLACEMENT    . LEFT HEART CATH AND CORONARY ANGIOGRAPHY N/A 10/24/2016   Procedure: LEFT HEART CATH AND CORONARY ANGIOGRAPHY;  Surgeon: Runell Gess, MD;  Location: [facility] INVASIVE CV LAB;  Service: Cardiovascular;  Laterality: N/A;  . TOTAL SHOULDER REPLACEMENT Right 03/23/2008     Home Medications:  Prior to Admission medications   Medication Sig Start Date End Date Taking? Authorizing Provider  aspirin EC 81 MG tablet Take 81 mg by mouth daily.   Yes [provider]  atorvastatin (LIPITOR) 80 MG tablet TAKE 1 TABLET DAILY AT 6PM Patient taking differently: Take 80 mg by mouth every evening.  08/18/17  Yes Lozano, Velna Hatchet, MD  cycloSPORINE (RESTASIS) 0.05 % ophthalmic emulsion Place 1 drop into both eyes 2 (two) times daily.   Yes [provider]  diclofenac sodium (VOLTAREN) 1 % GEL Apply 1 application topically daily as needed (for pain).   Yes [provider]  ENTRESTO 24-26 MG TAKE 1 TABLET BY MOUTH TWICE A DAY Patient taking differently: Take 1 tablet by mouth 2 (two) times daily.  02/23/18  Yes Bensimhon, Bevelyn Buckles, MD  furosemide (LASIX) 20 MG tablet Take 1 tablet (20 mg total) by mouth daily as needed. Patient taking differently: Take 20 mg by mouth daily as needed for fluid or edema.  01/20/17 11/11/18 Yes Azalee Course, PA  gabapentin (NEURONTIN) 100 MG capsule Take 200 mg by mouth at bedtime.  03/03/17  Yes [provider]  isosorbide mononitrate (IMDUR) 30 MG 24 hr tablet TAKE 1 TABLET BY MOUTH EVERY DAY Patient taking differently: Take 30 mg by mouth daily.  12/20/17  Yes Azalee Course, PA  lidocaine (LIDODERM) 5 % Place 3 patches onto the skin daily as needed (pain).  10/17/14  Yes [provider]  metoprolol succinate (TOPROL-XL) 50 MG 24 hr tablet Take one tablet daily with food Patient taking differently: Take 50 mg by mouth daily. Take one tablet daily with food 12/06/17  Yes Runell Gess, MD  morphine (MS CONTIN) 15 MG 12 hr  tablet Take 15 mg by mouth See admin instructions. Take 15 mg by mouth in the morning with 30 mg to equal 45 mg dose 06/30/15  Yes [provider]  morphine (MS CONTIN) 30 MG 12 hr tablet Take 30 mg by mouth See admin instructions. Takes 30 mg by mouth in the morning with 15 mg to equal 45 mg dose, 30 mg in the afternoon, and 30 mg at night   Yes [provider]  nitroGLYCERIN (NITROSTAT) 0.4 MG SL tablet Place 1 tablet (0.4 mg total) under the tongue every 5 (five) minutes as needed for chest pain. 01/20/17  Yes Azalee Course, PA  Nutritional Supplements (JUICE PLUS FIBRE PO) Take 2 capsules by mouth 2 (two) times daily.   Yes [provider]  pantoprazole (PROTONIX) 40 MG tablet TAKE 1 TABLET DAILY Patient taking differently: Take 40 mg by mouth every other day.  01/24/18  Yes [place], Velna HatchetKawanta F, MD  spironolactone (ALDACTONE) 25 MG tablet Take 0.5 tablets (12.5 mg total) by mouth daily. 11/11/17  Yes Bensimhon, Bevelyn Bucklesaniel R, MD  SYNTHROID 88 MCG tablet TAKE 1 TABLET DAILY BEFORE BREAKFAST Patient taking differently: Take 88 mcg by mouth daily before breakfast.  04/04/18  Yes [place], Velna HatchetKawanta F, MD  temazepam (RESTORIL) 15 MG capsule Take 1 capsule (15 mg total) by mouth at bedtime as needed for sleep. Patient taking differently: Take 15 mg by mouth every other day.  12/29/17  Yes Dorena Bodoixon, Mary B, PA-C  traZODone (DESYREL) 50 MG tablet Take 25 mg by mouth at bedtime.  09/02/16  Yes [provider]    Inpatient Medications: Scheduled Meds: . aspirin EC  81 mg Oral Daily  . atorvastatin  80 mg Oral QPM  . cycloSPORINE  1 drop Both Eyes BID  . gabapentin  200 mg Oral QHS  . heparin  5,000 Units Subcutaneous Q8H  . isosorbide mononitrate  30 mg Oral Daily  . levothyroxine  88 mcg Oral QAC breakfast  . metoprolol succinate  50 mg Oral Daily  . morphine  30 mg Oral BID  . morphine  45 mg Oral Q breakfast  . sacubitril-valsartan  1 tablet Oral BID  . traZODone  25 mg Oral QHS     Continuous Infusions: . dextrose 5 % and 0.9% NaCl 50 mL/hr at 04/10/18 0645  . famotidine (PEPCID) IV Stopped (04/09/18 2016)  . piperacillin-tazobactam (ZOSYN)  IV 3.375 g (04/10/18 1051)   PRN Meds: acetaminophen **OR** acetaminophen, morphine injection, nitroGLYCERIN, ondansetron **OR** ondansetron (ZOFRAN) IV  Allergies:    Allergies  Allergen Reactions  . Other Other (See Comments)    Seasonal allergies - sniffling     Social History:   Social History   Socioeconomic History  . Marital status: Divorced    Spouse name: Not on file  . Number of children: 3  . Years of education: 1912  . Highest education level: Not on file  Occupational History  . Occupation: retired  Engineer, productionocial Needs  . Financial resource strain: Not hard at all  . Food insecurity:    Worry: Never true    Inability: Never true  . Transportation needs:    Medical: No    Non-medical: No  Tobacco Use  . Smoking status: Former Smoker    Years: 53.00    Types: Cigarettes    Last attempt to quit: 04/19/2017    Years since quitting: 0.9  . Smokeless tobacco: Never Used  Substance and Sexual Activity  . Alcohol use: Yes    Alcohol/week: 5.0 standard drinks    Types: 5 Cans of beer per week  . Drug use: Yes    Types: Marijuana    Comment: h/o IVDA; "no IVDAsince the 1970s; last smoked pot ~ 10/2016"  . Sexual activity: Not Currently  Lifestyle  . Physical activity:    Days per week: Not on file    Minutes per session: Not on file  . Stress: Not on file  Relationships  . Social connections:    Talks on phone: Not on file    Gets together: Not on file    Attends religious service: Not on file    Active member of club or organization: Not on file    Attends meetings of clubs or organizations: Not on file    Relationship status: Not on file  . Intimate partner violence:    Fear of current or ex partner: Not  on file    Emotionally abused: Not on file    Physically abused: Not on file    Forced  sexual activity: Not on file  Other Topics Concern  . Not on file  Social History Narrative   Entered 12/2013:   Lives with his Sister, Niece, and Mother   They take turns caring for mother, who is [id number]   Quit smoking in 2013    Family History:    Family History  Problem Relation Age of Onset  . Arthritis Father        rheumatoid  . Hypertension Sister   . Cancer Sister   . Scoliosis Sister   . Arthritis Sister   . Osteoarthritis Mother   . Arthritis/Rheumatoid Sister   . Peripheral vascular disease Sister   . Arrhythmia Sister   . CAD Brother      ROS:  Please see the history of present illness.  General:no colds or fevers, no weight changes Skin:no rashes or ulcers HEENT:no blurred vision, no congestion CV:see HPI PUL:see HPI GI:no diarrhea constipation or melena, no indigestion GU:no hematuria, no dysuria MS:no joint pain, no claudication Neuro:no syncope, no lightheadedness Endo:no diabetes, no thyroid disease  All other ROS reviewed and negative.     Physical Exam/Data:   Vitals:   04/09/18 1835 04/09/18 2007 04/10/18 0433 04/10/18 1043  BP: (!) 166/92 (!) 144/77 99/76 110/69  Pulse: (!) 52 (!) 56 71 (!) 59  Resp: 17 19 19 17   Temp: 98.5 F (36.9 C) 99.2 F (37.3 C) 98.3 F (36.8 C) 98.4 F (36.9 C)  TempSrc: Oral Oral Oral Oral  SpO2: 99% 98% 95% 95%  Weight:      Height:        Intake/Output Summary (Last 24 hours) at 04/10/2018 1116 Last data filed at 04/10/2018 0750 Gross per 24 hour  Intake 715.52 ml  Output 600 ml  Net 115.52 ml   Last 3 Weights 04/09/2018 01/11/2018 11/10/2017  Weight (lbs) 170 lb 173 lb 6.4 oz 165 lb 8 oz  Weight (kg) 77.111 kg 78.654 kg 75.07 kg     Body mass index is 24.39 kg/m.  General:  Well nourished, well developed, in no acute distress HEENT: normal Lymph: no adenopathy Neck: no JVD Endocrine:  No thryomegaly Vascular: No carotid bruits; pedal pulses 2+ bilaterally  Cardiac:  normal S1, S2; RRR; soft murmur  , no gallup or rub Lungs:  clear to auscultation bilaterally, no wheezing, rhonchi or rales  Abd: soft, nontender, no hepatomegaly  Ext: no edema Musculoskeletal:  No deformities, BUE and BLE strength normal and equal Skin: warm and dry  Neuro:  Alert and oriented X 3 MAE follows commands, no focal abnormalities noted Psych:  Normal affect    Relevant CV Studies: 10/24/16 cardiac cath Diagnostic  Dominance: Right    Unsuccessful attempt at proximal mid LAD intervention in the setting of a large anterolateral infarct. The patient's EF is in the 30-35% with anteroapical wall motion abnormality. This proximal LAD was high-grade and calcified and the entire LAD was fluoroscopically calcified. His mid LAD behaved as "a CTO" and I was unable to cross. I abandoned intervention elected to place an intra-aortic balloon pump for hemodynamic support given his borderline blood pressure. His LVEDP was low. He received intravenous fluid bolus. His potassium was low as well and he received potassium IV. He was electrocardiographically stable throughout the procedure. He is not a candidate for coronary artery bypass grafting since I do not think  there is an LAD target. I recommended medical therapy at this time.  Echo 10/06/17 Study Conclusions  - Left ventricle: The cavity size was normal. There was mild   concentric hypertrophy. Systolic function was moderately reduced.   The estimated ejection fraction was in the range of 35% to 40%.   Doppler parameters are consistent with abnormal left ventricular   relaxation (grade 1 diastolic dysfunction). - Regional wall motion abnormality: Hypokinesis of the apical   anterior, mid anteroseptal, apical inferior, mid inferolateral,   apical septal, apical lateral, and apical myocardium. - Aortic valve: Sclerosis without stenosis. There was no   regurgitation. - Ascending aorta: The ascending aorta was at the upper limits of   normal. - Mitral valve: Calcified  annulus. There was mild regurgitation. - Left atrium: The atrium was moderately dilated. - Right ventricle: Systolic function was low normal. - Atrial septum: No defect or patent foramen ovale was identified. - Tricuspid valve: There was mild regurgitation. Peak RV-RA   gradient (S): 25 mm Hg. - Pulmonic valve: There was no significant regurgitation. - Pulmonary arteries: Systolic pressure was within the normal   range. PA peak pressure: 28 mm Hg (S).  Impressions:  - Moderate to severe reduction in LV EF with wall motion   abnormalities above, similar to prior.     Laboratory Data:  Chemistry Recent Labs  Lab 04/09/18 1240 04/10/18 0234  NA 136 136  K 4.1 5.0  CL 102 104  CO2 23 21*  GLUCOSE 151* 115*  BUN 19 17  CREATININE 1.14 0.95  CALCIUM 9.0 8.8*  GFRNONAA >60 >60  GFRAA >60 >60  ANIONGAP 11 11    Recent Labs  Lab 04/09/18 1240  PROT 6.3*  ALBUMIN 3.7  AST 45*  ALT 41  ALKPHOS 82  BILITOT 0.7   Hematology Recent Labs  Lab 04/09/18 1240 04/10/18 0234  WBC 5.4 8.9  RBC 4.70 4.62  HGB 14.6 13.8  HCT 43.4 41.3  MCV 92.3 89.4  MCH 31.1 29.9  MCHC 33.6 33.4  RDW 12.7 12.7  PLT 120* PLATELET CLUMPS NOTED ON SMEAR, COUNT APPEARS DECREASED   Cardiac EnzymesNo results for input(s): TROPONINI in the last 168 hours.  Recent Labs  Lab 04/09/18 1245  TROPIPOC 0.01    BNPNo results for input(s): BNP, PROBNP in the last 168 hours.  DDimer  Recent Labs  Lab 04/09/18 1240  DDIMER 0.72*    Radiology/Studies:  Dg Chest 2 View  Result Date: 04/09/2018 CLINICAL DATA:  Right chest pain and shortness of breath. EXAM: CHEST - 2 VIEW COMPARISON:  March 25, 2017 FINDINGS: The heart size and mediastinal contours are stable. Cardiac pacemaker is unchanged. Mild opacity is identified in the left lung base at least in part due to atelectasis but developing pneumonia is not excluded. The right lung is clear. The visualized skeletal structures are stable.  IMPRESSION: Mild opacity is identified in the left lung base at least in part due to atelectasis but developing pneumonia is not excluded. Electronically Signed   By: Sherian Rein M.D.   On: 04/09/2018 13:39   Ct Angio Chest Pe W/cm &/or Wo Cm  Result Date: 04/09/2018 CLINICAL DATA:  Right sided upper quadrant to lower abdominal pain with nausea since this morning. EXAM: CT ANGIOGRAPHY CHEST CT ABDOMEN AND PELVIS WITH CONTRAST TECHNIQUE: Multidetector CT imaging of the chest was performed using the standard protocol during bolus administration of intravenous contrast. Multiplanar CT image reconstructions and MIPs were obtained to evaluate  the vascular anatomy. Multidetector CT imaging of the abdomen and pelvis was performed using the standard protocol during bolus administration of intravenous contrast. CONTRAST:  ISOVUE-370 IOPAMIDOL (ISOVUE-370) INJECTION 76% COMPARISON:  CT abdomen pelvis, 05/20/2004 FINDINGS: CTA CHEST FINDINGS Cardiovascular: There is satisfactory opacification of the pulmonary arteries to the segmental level. There is no evidence of a pulmonary embolism. Heart is top-normal in size. No pericardial effusion. There are dense three-vessel coronary artery calcifications. Great vessels are normal in caliber. Mild aortic atherosclerosis. Mediastinum/Nodes: No neck base, axillary, mediastinal or hilar masses or enlarged lymph nodes. Trachea and esophagus are unremarkable. Lungs/Pleura: There is dependent opacity in the lower lobes and in the posterior inferior left upper lobe lingula, most likely atelectasis. 3 mm subpleural nodule, lateral right middle lobe, image 67, series 7, without significant change from the 2006 CT. No other nodules. There is no evidence of pulmonary edema. No pleural effusion or pneumothorax. Musculoskeletal: No fracture or acute finding. No osteoblastic or osteolytic lesions. Review of the MIP images confirms the above findings. CT ABDOMEN and PELVIS FINDINGS  Hepatobiliary: Liver demonstrates morphologic changes of cirrhosis, overall decreased size, with central volume loss and relative enlargement of the lateral segment of the left lobe and caudate lobe as well as nodularity. No discrete liver mass. Gallbladder is distended density suggested in the gallbladder neck which may reflect a small stone. There is no wall thickening. No pericholecystic fluid. No bile duct dilation. Pancreas: Unremarkable. No pancreatic ductal dilatation or surrounding inflammatory changes. Spleen: Spleen mildly enlarged measuring 14 x 5 x 11 cm. No splenic mass or focal lesion. Adrenals/Urinary Tract: No adrenal masses. Two small low-density masses, 6 mm, lower pole right and 5 mm, midpole left, consistent with small cysts or angiomyolipomas. No other renal masses. Nonobstructing stone midpole the left kidney. No hydronephrosis. Normal ureters. Normal bladder. Stomach/Bowel: Stomach is unremarkable. Small bowel is normal caliber. No wall thickening or inflammation. There are multiple left colon diverticula mostly along the sigmoid. No diverticulitis. No colon wall thickening or other inflammatory process. Normal appendix visualized. Vascular/Lymphatic: Dense aortic atherosclerosis. No aneurysm. There are prominent to mildly enlarged lymph nodes along the gastrohepatic ligament. Reproductive: Unremarkable Other: No hernia or ascites. Musculoskeletal: No fracture or acute finding. No osteoblastic or osteolytic lesions. Degenerative changes noted throughout the visualized spine. Review of the MIP images confirms the above findings. IMPRESSION: CTA CHEST 1. No evidence of a pulmonary embolism. 2. Dependent opacity in the lower lobes most consistent with atelectasis. Consider a component of pneumonia if there are consistent clinical findings. No evidence of pulmonary edema. 3. Coronary artery calcifications and aortic atherosclerosis. CT ABDOMEN AND PELVIS 1. Gallbladder is distended and there is a  possible stone in the gallbladder neck, but there is no wall thickening or adjacent inflammation. If clinical findings strongly support acute cholecystitis, consider follow-up right upper quadrant ultrasound for further assessment. 2. Cirrhosis.  Mild splenomegaly. 3. Mild gastrohepatic ligament adenopathy, presumed reactive in the setting of cirrhosis. 4. Dense aortic atherosclerosis. 5. Left colon diverticula without diverticulitis. Electronically Signed   By: Amie Portland M.D.   On: 04/09/2018 14:22   Ct Abdomen Pelvis W Contrast  Result Date: 04/09/2018 CLINICAL DATA:  Right sided upper quadrant to lower abdominal pain with nausea since this morning. EXAM: CT ANGIOGRAPHY CHEST CT ABDOMEN AND PELVIS WITH CONTRAST TECHNIQUE: Multidetector CT imaging of the chest was performed using the standard protocol during bolus administration of intravenous contrast. Multiplanar CT image reconstructions and MIPs were obtained to evaluate  the vascular anatomy. Multidetector CT imaging of the abdomen and pelvis was performed using the standard protocol during bolus administration of intravenous contrast. CONTRAST:  ISOVUE-370 IOPAMIDOL (ISOVUE-370) INJECTION 76% COMPARISON:  CT abdomen pelvis, 05/20/2004 FINDINGS: CTA CHEST FINDINGS Cardiovascular: There is satisfactory opacification of the pulmonary arteries to the segmental level. There is no evidence of a pulmonary embolism. Heart is top-normal in size. No pericardial effusion. There are dense three-vessel coronary artery calcifications. Great vessels are normal in caliber. Mild aortic atherosclerosis. Mediastinum/Nodes: No neck base, axillary, mediastinal or hilar masses or enlarged lymph nodes. Trachea and esophagus are unremarkable. Lungs/Pleura: There is dependent opacity in the lower lobes and in the posterior inferior left upper lobe lingula, most likely atelectasis. 3 mm subpleural nodule, lateral right middle lobe, image 67, series 7, without significant  change from the 2006 CT. No other nodules. There is no evidence of pulmonary edema. No pleural effusion or pneumothorax. Musculoskeletal: No fracture or acute finding. No osteoblastic or osteolytic lesions. Review of the MIP images confirms the above findings. CT ABDOMEN and PELVIS FINDINGS Hepatobiliary: Liver demonstrates morphologic changes of cirrhosis, overall decreased size, with central volume loss and relative enlargement of the lateral segment of the left lobe and caudate lobe as well as nodularity. No discrete liver mass. Gallbladder is distended density suggested in the gallbladder neck which may reflect a small stone. There is no wall thickening. No pericholecystic fluid. No bile duct dilation. Pancreas: Unremarkable. No pancreatic ductal dilatation or surrounding inflammatory changes. Spleen: Spleen mildly enlarged measuring 14 x 5 x 11 cm. No splenic mass or focal lesion. Adrenals/Urinary Tract: No adrenal masses. Two small low-density masses, 6 mm, lower pole right and 5 mm, midpole left, consistent with small cysts or angiomyolipomas. No other renal masses. Nonobstructing stone midpole the left kidney. No hydronephrosis. Normal ureters. Normal bladder. Stomach/Bowel: Stomach is unremarkable. Small bowel is normal caliber. No wall thickening or inflammation. There are multiple left colon diverticula mostly along the sigmoid. No diverticulitis. No colon wall thickening or other inflammatory process. Normal appendix visualized. Vascular/Lymphatic: Dense aortic atherosclerosis. No aneurysm. There are prominent to mildly enlarged lymph nodes along the gastrohepatic ligament. Reproductive: Unremarkable Other: No hernia or ascites. Musculoskeletal: No fracture or acute finding. No osteoblastic or osteolytic lesions. Degenerative changes noted throughout the visualized spine. Review of the MIP images confirms the above findings. IMPRESSION: CTA CHEST 1. No evidence of a pulmonary embolism. 2. Dependent  opacity in the lower lobes most consistent with atelectasis. Consider a component of pneumonia if there are consistent clinical findings. No evidence of pulmonary edema. 3. Coronary artery calcifications and aortic atherosclerosis. CT ABDOMEN AND PELVIS 1. Gallbladder is distended and there is a possible stone in the gallbladder neck, but there is no wall thickening or adjacent inflammation. If clinical findings strongly support acute cholecystitis, consider follow-up right upper quadrant ultrasound for further assessment. 2. Cirrhosis.  Mild splenomegaly. 3. Mild gastrohepatic ligament adenopathy, presumed reactive in the setting of cirrhosis. 4. Dense aortic atherosclerosis. 5. Left colon diverticula without diverticulitis. Electronically Signed   By: Amie Portland M.D.   On: 04/09/2018 14:22   US Abdomen Limited Ruq  Result Date: 04/09/2018 CLINICAL DATA:  Right upper quadrant pain. EXAM: ULTRASOUND ABDOMEN LIMITED RIGHT UPPER QUADRANT COMPARISON:  None. FINDINGS: Gallbladder: The gallbladder is distended without wall thickening. The wall measures 2 mm. There may be a tiny amount of pericholecystic fluid. A positive Murphy's sign is reported. There is sludge in the gallbladder with no stones. Common  bile duct: Diameter: 6.3 mm Liver: A nodular contour and heterogeneous echotexture of the liver is consistent with known cirrhosis. No focal mass. The umbilical vein is recannulized. The portal vein is patent but appears to contain bidirectional flow. IMPRESSION: 1. Findings associated with the gallbladder are mixed. Gallbladder distension, gallbladder sludge, a positive Murphy's sign, and the possibility of minimal pericholecystic fluid all raise the possibility of acute cholecystitis. However, there is no wall thickening or stones. A HIDA scan could further evaluate as clinically warranted. 2. Cirrhotic liver. 3. Recannulization of the umbilical vein and bidirectional flow in the portal vein are consistent with  portal venous hypertension. 4. The common bile duct is borderline in caliber. Recommend correlation with labs. Electronically Signed   By: Gerome Sam III M.D   On: 04/09/2018 15:58    Assessment and Plan:   1. Pre-op eval with significant CAD and ICM with EF 35% is at high risk for surgery.  He is pain free of cardiac pain.  He has been stable for several months.  But would do least invasive procedure as possible. 2. CAD severe and not candidate for intervention. Continue imdur, BB and ASA.  3. ICM with EF 35% - stable. On lasix as outpt. Holding for now, monitor HF< euvolemic, continue entresto if labs allow. K+ 5.0. 4. HLD on statin at 80 mg. 5. Hepatitis C and cirrhosis on CT of abd 6. Chronic back pain on morphine.   For questions or updates, please contact [facility] Please consult [email or web address] for contact info under   Signed, Nada Boozer, NP  04/10/2018 11:16 AM  Pt seen and examined   I have amended note above   Agree with findings presented by L INgold above  Pt is a 73 yo with severe CAD, not amenable to intervention.   He has mod  LV dysfunction .   Takes activities as tolerated.   Admitted now with RUQ pain, possible cholecystitis.   Asked to define preop cardiac risk for surgery  On exam, pt is comfortable lay flat.   Denies CP Lungs are CTA Neck:   No JVD Cardiac RRR  I -II/VI systolic murmur    ABd:   deferrred   Sl exam Ext without edema  2+ PT pulses  IMPRESSION From a cardiac standpoint I think that pt is high risk for cardiac complications in the perioperative period.   I would recomm nonsurgical approach if possible rather than open or laproscopic surgical procedures   Hs CAD is very severe  Keep on current meds for now     Watch  Ins and outs closey with IV fluids      Will follow with you.  Dietrich Pates

## 2018-04-10 NOTE — Progress Notes (Signed)
Patient ID: Tommy Robbins Robbins, male   DOB: 03/08/1946, 73 y.o.   MRN: [id number] [facility] Progress Note:   * No surgery found *  Subjective: Mental status is clear.  HIDA results pending Objective: Vital signs in last 24 hours: Temp:  [97.5 F (36.4 C)-99.2 F (37.3 C)] 98.3 F (36.8 C) (01/19 0433) Pulse Rate:  [47-71] 71 (01/19 0433) Resp:  [12-20] 19 (01/19 0433) BP: (99-166)/(74-92) 99/76 (01/19 0433) SpO2:  [95 %-100 %] 95 % (01/19 0433) Weight:  [77.1 kg] 77.1 kg (01/18 1238)  Intake/Output from previous day: 01/18 0701 - 01/19 0700 In: 715.5 [P.O.:30; I.V.:554.7; IV Piggyback:130.8] Out: 400 [Urine:400] Intake/Output this shift: Total I/O In: -  Out: 200 [Urine:200]  Physical Exam: Work of breathing is normal.  Comfortable appearing.    Lab Results:  Results for orders placed or performed during the hospital encounter of 04/09/18 (from the past 48 hour(s))  Comprehensive metabolic panel     Status: Abnormal   Collection Time: 04/09/18 12:40 PM  Result Value Ref Range   Sodium 136 135 - 145 mmol/L   Potassium 4.1 3.5 - 5.1 mmol/L   Chloride 102 98 - 111 mmol/L   CO2 23 22 - 32 mmol/L   Glucose, Bld 151 (H) 70 - 99 mg/dL   BUN 19 8 - 23 mg/dL   Creatinine, Ser 2.441.14 0.61 - 1.24 mg/dL   Calcium 9.0 8.9 - 01.010.3 mg/dL   Total Protein 6.3 (L) 6.5 - 8.1 g/dL   Albumin 3.7 3.5 - 5.0 g/dL   AST 45 (H) 15 - 41 U/L   ALT 41 0 - 44 U/L   Alkaline Phosphatase 82 38 - 126 U/L   Total Bilirubin 0.7 0.3 - 1.2 mg/dL   GFR calc non Af Amer >60 >60 mL/min   GFR calc Af Amer >60 >60 mL/min   Anion gap 11 5 - 15    Comment: Performed at [facility], [address] [place], [place] [address]  CBC with Differential     Status: Abnormal   Collection Time: 04/09/18 12:40 PM  Result Value Ref Range   WBC 5.4 4.0 - 10.5 K/uL   RBC 4.70 4.22 - 5.81 MIL/uL   Hemoglobin 14.6 13.0 - 17.0 g/dL   HCT 66.443.4 40.339.0 - 47.452.0 %   MCV 92.3 80.0 - 100.0 fL   MCH 31.1 26.0 - 34.0  pg   MCHC 33.6 30.0 - 36.0 g/dL   RDW 25.912.7 56.311.5 - 87.515.5 %   Platelets 120 (L) 150 - 400 K/uL    Comment: REPEATED TO VERIFY PLATELET COUNT CONFIRMED BY SMEAR SPECIMEN CHECKED FOR CLOTS    nRBC 0.0 0.0 - 0.2 %   Neutrophils Relative % 79 %   Neutro Abs 4.2 1.7 - 7.7 K/uL   Lymphocytes Relative 9 %   Lymphs Abs 0.5 (L) 0.7 - 4.0 K/uL   Monocytes Relative 10 %   Monocytes Absolute 0.5 0.1 - 1.0 K/uL   Eosinophils Relative 2 %   Eosinophils Absolute 0.1 0.0 - 0.5 K/uL   Basophils Relative 0 %   Basophils Absolute 0.0 0.0 - 0.1 K/uL   Immature Granulocytes 0 %   Abs Immature Granulocytes 0.02 0.00 - 0.07 K/uL    Comment: Performed at [facility], [address] [place], [place] [address]  D-dimer, quantitative     Status: Abnormal   Collection Time: 04/09/18 12:40 PM  Result Value Ref Range   D-Dimer,  Quant 0.72 (H) 0.00 - 0.50 ug/mL-FEU    Comment: (NOTE) At the manufacturer cut-off of 0.50 ug/mL FEU, this assay has been documented to exclude PE with a sensitivity and negative predictive value of 97 to 99%.  At this time, this assay has not been approved by the FDA to exclude DVT/VTE. Results should be correlated with clinical presentation. Performed at [facility], [address] [place], [place] [address]   I-stat troponin, ED     Status: None   Collection Time: 04/09/18 12:45 PM  Result Value Ref Range   Troponin i, poc 0.01 0.00 - 0.08 ng/mL   Comment 3            Comment: Due to the release kinetics of cTnI, a negative result within the first hours of the onset of symptoms does not rule out myocardial infarction with certainty. If myocardial infarction is still suspected, repeat the test at appropriate intervals.   Lipase, blood     Status: None   Collection Time: 04/10/18  2:34 AM  Result Value Ref Range   Lipase 32 11 - 51 U/L    Comment: Performed at [facility], [address] [place], [place] [address]  Basic metabolic panel     Status:  Abnormal   Collection Time: 04/10/18  2:34 AM  Result Value Ref Range   Sodium 136 135 - 145 mmol/L   Potassium 5.0 3.5 - 5.1 mmol/L   Chloride 104 98 - 111 mmol/L   CO2 21 (L) 22 - 32 mmol/L   Glucose, Bld 115 (H) 70 - 99 mg/dL   BUN 17 8 - 23 mg/dL   Creatinine, Ser 2.44 0.61 - 1.24 mg/dL   Calcium 8.8 (L) 8.9 - 10.3 mg/dL   GFR calc non Af Amer >60 >60 mL/min   GFR calc Af Amer >60 >60 mL/min   Anion gap 11 5 - 15    Comment: Performed at [facility], [address], [place], [place] [address]  CBC     Status: None   Collection Time: 04/10/18  2:34 AM  Result Value Ref Range   WBC 8.9 4.0 - 10.5 K/uL   RBC 4.62 4.22 - 5.81 MIL/uL   Hemoglobin 13.8 13.0 - 17.0 g/dL   HCT 81.7 71.1 - 65.7 %   MCV 89.4 80.0 - 100.0 fL   MCH 29.9 26.0 - 34.0 pg   MCHC 33.4 30.0 - 36.0 g/dL   RDW 90.3 83.3 - 38.3 %   Platelets  150 - 400 K/uL    PLATELET CLUMPS NOTED ON SMEAR, COUNT APPEARS DECREASED    Comment: Immature Platelet Fraction may be clinically indicated, consider ordering this additional test [id number]    nRBC 0.0 0.0 - 0.2 %    Comment: Performed at [facility], [address], [place], [place] [address]    Radiology/Results: Dg Chest 2 View  Result Date: 04/09/2018 CLINICAL DATA:  Right chest pain and shortness of breath. EXAM: CHEST - 2 VIEW COMPARISON:  March 25, 2017 FINDINGS: The heart size and mediastinal contours are stable. Cardiac pacemaker is unchanged. Mild opacity is identified in the left lung base at least in part due to atelectasis but developing pneumonia is not excluded. The right lung is clear. The visualized skeletal structures are stable. IMPRESSION: Mild opacity is identified in the left lung base at least in part due to atelectasis but developing pneumonia is not excluded. Electronically Signed  By: Sherian Rein M.D.   On: 04/09/2018 13:39   Ct Angio Chest Pe W/cm &/or Wo Cm  Result Date: 04/09/2018 CLINICAL DATA:  Right sided upper  quadrant to lower abdominal pain with nausea since this morning. EXAM: CT ANGIOGRAPHY CHEST CT ABDOMEN AND PELVIS WITH CONTRAST TECHNIQUE: Multidetector CT imaging of the chest was performed using the standard protocol during bolus administration of intravenous contrast. Multiplanar CT image reconstructions and MIPs were obtained to evaluate the vascular anatomy. Multidetector CT imaging of the abdomen and pelvis was performed using the standard protocol during bolus administration of intravenous contrast. CONTRAST:  ISOVUE-370 IOPAMIDOL (ISOVUE-370) INJECTION 76% COMPARISON:  CT abdomen pelvis, 05/20/2004 FINDINGS: CTA CHEST FINDINGS Cardiovascular: There is satisfactory opacification of the pulmonary arteries to the segmental level. There is no evidence of a pulmonary embolism. Heart is top-normal in size. No pericardial effusion. There are dense three-vessel coronary artery calcifications. Great vessels are normal in caliber. Mild aortic atherosclerosis. Mediastinum/Nodes: No neck base, axillary, mediastinal or hilar masses or enlarged lymph nodes. Trachea and esophagus are unremarkable. Lungs/Pleura: There is dependent opacity in the lower lobes and in the posterior inferior left upper lobe lingula, most likely atelectasis. 3 mm subpleural nodule, lateral right middle lobe, image 67, series 7, without significant change from the 2006 CT. No other nodules. There is no evidence of pulmonary edema. No pleural effusion or pneumothorax. Musculoskeletal: No fracture or acute finding. No osteoblastic or osteolytic lesions. Review of the MIP images confirms the above findings. CT ABDOMEN and PELVIS FINDINGS Hepatobiliary: Liver demonstrates morphologic changes of cirrhosis, overall decreased size, with central volume loss and relative enlargement of the lateral segment of the left lobe and caudate lobe as well as nodularity. No discrete liver mass. Gallbladder is distended density suggested in the gallbladder neck  which may reflect a small stone. There is no wall thickening. No pericholecystic fluid. No bile duct dilation. Pancreas: Unremarkable. No pancreatic ductal dilatation or surrounding inflammatory changes. Spleen: Spleen mildly enlarged measuring 14 x 5 x 11 cm. No splenic mass or focal lesion. Adrenals/Urinary Tract: No adrenal masses. Two small low-density masses, 6 mm, lower pole right and 5 mm, midpole left, consistent with small cysts or angiomyolipomas. No other renal masses. Nonobstructing stone midpole the left kidney. No hydronephrosis. Normal ureters. Normal bladder. Stomach/Bowel: Stomach is unremarkable. Small bowel is normal caliber. No wall thickening or inflammation. There are multiple left colon diverticula mostly along the sigmoid. No diverticulitis. No colon wall thickening or other inflammatory process. Normal appendix visualized. Vascular/Lymphatic: Dense aortic atherosclerosis. No aneurysm. There are prominent to mildly enlarged lymph nodes along the gastrohepatic ligament. Reproductive: Unremarkable Other: No hernia or ascites. Musculoskeletal: No fracture or acute finding. No osteoblastic or osteolytic lesions. Degenerative changes noted throughout the visualized spine. Review of the MIP images confirms the above findings. IMPRESSION: CTA CHEST 1. No evidence of a pulmonary embolism. 2. Dependent opacity in the lower lobes most consistent with atelectasis. Consider a component of pneumonia if there are consistent clinical findings. No evidence of pulmonary edema. 3. Coronary artery calcifications and aortic atherosclerosis. CT ABDOMEN AND PELVIS 1. Gallbladder is distended and there is a possible stone in the gallbladder neck, but there is no wall thickening or adjacent inflammation. If clinical findings strongly support acute cholecystitis, consider follow-up right upper quadrant ultrasound for further assessment. 2. Cirrhosis.  Mild splenomegaly. 3. Mild gastrohepatic ligament adenopathy,  presumed reactive in the setting of cirrhosis. 4. Dense aortic atherosclerosis. 5. Left colon diverticula without diverticulitis.  Electronically Signed   By: Amie Portland M.D.   On: 04/09/2018 14:22   Ct Abdomen Pelvis W Contrast  Result Date: 04/09/2018 CLINICAL DATA:  Right sided upper quadrant to lower abdominal pain with nausea since this morning. EXAM: CT ANGIOGRAPHY CHEST CT ABDOMEN AND PELVIS WITH CONTRAST TECHNIQUE: Multidetector CT imaging of the chest was performed using the standard protocol during bolus administration of intravenous contrast. Multiplanar CT image reconstructions and MIPs were obtained to evaluate the vascular anatomy. Multidetector CT imaging of the abdomen and pelvis was performed using the standard protocol during bolus administration of intravenous contrast. CONTRAST:  ISOVUE-370 IOPAMIDOL (ISOVUE-370) INJECTION 76% COMPARISON:  CT abdomen pelvis, 05/20/2004 FINDINGS: CTA CHEST FINDINGS Cardiovascular: There is satisfactory opacification of the pulmonary arteries to the segmental level. There is no evidence of a pulmonary embolism. Heart is top-normal in size. No pericardial effusion. There are dense three-vessel coronary artery calcifications. Great vessels are normal in caliber. Mild aortic atherosclerosis. Mediastinum/Nodes: No neck base, axillary, mediastinal or hilar masses or enlarged lymph nodes. Trachea and esophagus are unremarkable. Lungs/Pleura: There is dependent opacity in the lower lobes and in the posterior inferior left upper lobe lingula, most likely atelectasis. 3 mm subpleural nodule, lateral right middle lobe, image 67, series 7, without significant change from the 2006 CT. No other nodules. There is no evidence of pulmonary edema. No pleural effusion or pneumothorax. Musculoskeletal: No fracture or acute finding. No osteoblastic or osteolytic lesions. Review of the MIP images confirms the above findings. CT ABDOMEN and PELVIS FINDINGS Hepatobiliary:  Liver demonstrates morphologic changes of cirrhosis, overall decreased size, with central volume loss and relative enlargement of the lateral segment of the left lobe and caudate lobe as well as nodularity. No discrete liver mass. Gallbladder is distended density suggested in the gallbladder neck which may reflect a small stone. There is no wall thickening. No pericholecystic fluid. No bile duct dilation. Pancreas: Unremarkable. No pancreatic ductal dilatation or surrounding inflammatory changes. Spleen: Spleen mildly enlarged measuring 14 x 5 x 11 cm. No splenic mass or focal lesion. Adrenals/Urinary Tract: No adrenal masses. Two small low-density masses, 6 mm, lower pole right and 5 mm, midpole left, consistent with small cysts or angiomyolipomas. No other renal masses. Nonobstructing stone midpole the left kidney. No hydronephrosis. Normal ureters. Normal bladder. Stomach/Bowel: Stomach is unremarkable. Small bowel is normal caliber. No wall thickening or inflammation. There are multiple left colon diverticula mostly along the sigmoid. No diverticulitis. No colon wall thickening or other inflammatory process. Normal appendix visualized. Vascular/Lymphatic: Dense aortic atherosclerosis. No aneurysm. There are prominent to mildly enlarged lymph nodes along the gastrohepatic ligament. Reproductive: Unremarkable Other: No hernia or ascites. Musculoskeletal: No fracture or acute finding. No osteoblastic or osteolytic lesions. Degenerative changes noted throughout the visualized spine. Review of the MIP images confirms the above findings. IMPRESSION: CTA CHEST 1. No evidence of a pulmonary embolism. 2. Dependent opacity in the lower lobes most consistent with atelectasis. Consider a component of pneumonia if there are consistent clinical findings. No evidence of pulmonary edema. 3. Coronary artery calcifications and aortic atherosclerosis. CT ABDOMEN AND PELVIS 1. Gallbladder is distended and there is a possible stone  in the gallbladder neck, but there is no wall thickening or adjacent inflammation. If clinical findings strongly support acute cholecystitis, consider follow-up right upper quadrant ultrasound for further assessment. 2. Cirrhosis.  Mild splenomegaly. 3. Mild gastrohepatic ligament adenopathy, presumed reactive in the setting of cirrhosis. 4. Dense aortic atherosclerosis. 5. Left colon diverticula without diverticulitis.  Electronically Signed   By: Amie Portlandavid  Ormond M.D.   On: 04/09/2018 14:22   [place] Abdomen Limited Ruq  Result Date: 04/09/2018 CLINICAL DATA:  Right upper quadrant pain. EXAM: ULTRASOUND ABDOMEN LIMITED RIGHT UPPER QUADRANT COMPARISON:  None. FINDINGS: Gallbladder: The gallbladder is distended without wall thickening. The wall measures 2 mm. There may be a tiny amount of pericholecystic fluid. A positive Murphy's sign is reported. There is sludge in the gallbladder with no stones. Common bile duct: Diameter: 6.3 mm Liver: A nodular contour and heterogeneous echotexture of the liver is consistent with known cirrhosis. No focal mass. The umbilical vein is recannulized. The portal vein is patent but appears to contain bidirectional flow. IMPRESSION: 1. Findings associated with the gallbladder are mixed. Gallbladder distension, gallbladder sludge, a positive Murphy's sign, and the possibility of minimal pericholecystic fluid all raise the possibility of acute cholecystitis. However, there is no wall thickening or stones. A HIDA scan could further evaluate as clinically warranted. 2. Cirrhotic liver. 3. Recannulization of the umbilical vein and bidirectional flow in the portal vein are consistent with portal venous hypertension. 4. The common bile duct is borderline in caliber. Recommend correlation with labs. Electronically Signed   By: Gerome Samavid  Williams III M.D   On: 04/09/2018 15:58    Anti-infectives: Anti-infectives (From admission, onward)   Start     Dose/Rate Route Frequency Ordered Stop    04/09/18 1800  piperacillin-tazobactam (ZOSYN) IVPB 3.375 g     3.375 g 12.5 mL/hr over 240 Minutes Intravenous Every 8 hours 04/09/18 1716     04/09/18 1430  cefTRIAXone (ROCEPHIN) 2 g in sodium chloride 0.9 % 100 mL IVPB     2 g 200 mL/hr over 30 Minutes Intravenous  Once 04/09/18 1427 04/09/18 1545      Assessment/Plan: Problem List: Patient Active Problem List   Diagnosis Date Noted  . Abdominal pain 04/09/2018  . Cholecystitis 04/09/2018  . Ischemic cardiomyopathy 03/24/2017  . CAD (coronary artery disease), native coronary artery 11/06/2016  . Systolic CHF (HCC) 11/06/2016  . Acute ST elevation myocardial infarction (STEMI) involving left anterior descending (LAD) coronary artery (HCC) 10/25/2016  . Actinic keratosis 07/25/2015  . Chronic low back pain   . COPD (chronic obstructive pulmonary disease) (HCC) 09/11/2012  . Hepatitis C 09/11/2012  . GERD (gastroesophageal reflux disease) 09/11/2012  . Insomnia 09/11/2012  . Hypothyroidism 09/20/2006  . HYPERTENSION, BENIGN ESSENTIAL 09/20/2006  . ECHOCARDIOGRAM, HX OF 09/20/2006    Needs cardiology risks assessment.  Patient is asking about surgery since his 72 year old mother was able to have hip surgery.  I would agree with Dr. Cliffton AstersWhite that if GB intervention is necessary then percutaneous approach may be better.   * No surgery found *    LOS: 1 day   Matt B. Daphine DeutscherMartin, MD, [facility]  [facility] [phone] beeper [phone]  04/10/2018 10:38 AM

## 2018-04-10 NOTE — Progress Notes (Signed)
PROGRESS NOTE    Tommy Robbins  NWG:[id number] DOB: [birth year] DOA: 04/09/2018 PCP: Dorena Bodo, PA-C     Brief Narrative:  Tommy Robbins is a 73 y.o. male with medical history significant of hypertension and coronary disease s/p ventricular fibrillation cardiac arrest, as well as AICD.  Patient presents with right upper quad abdominal pain, for the last about 8 hours, persistent, dull in nature, 10 out of 10 in intensity, no radiation, associated with nausea but no vomiting, no improving or worsening factors. Patient was evaluated with CT of the abdomen and pelvis, plus abdominal ultrasonography.  Suggesting acute cholecystitis due to gallbladder distention, possible stone but there was no gallbladder thickening.   New events last 24 hours / Subjective: States his right upper quadrant pain and nausea has improved today. Denies CP, SOB. Awaiting HIDA scan results  Assessment & Plan:   Active Problems:   Abdominal pain   Cholecystitis   Acute cholecystitis -CT abdomen pelvis with gallbladder distention -Right upper quadrant ultrasound with gallbladder distention, gallbladder sludge, positive Murphy sign, possible pericholecystic fluid but no wall thickening or stones -HIDA scan pending -General surgery following -IV Zosyn, IVF, Clear liquid diet  Ischemic cardiomyopathy status post AICD/chronic systolic heart failure -Continue home meds including imdur, metoprolol, Entresto, aspirin -Lasix and spironolactone was held on admission, low-rate IVF for now due to cholecystitis   Hyperlipidemia -Continue Lipitor  Left basilar atelectasis -Doubt pneumonia, no clinical symptoms to indicate pneumonia, afebrile, WBC normal, no cough. Continue to monitor  Hypothyroidism -Continue Synthroid  Depression -Continue trazodone  DVT prophylaxis: Subq hep  Code Status: Full code Family Communication: No family at bedside Disposition Plan: Pending HIDA scan, surgery plan   Consultants:    General surgery  Cardiology  Procedures:   None  Antimicrobials:  Anti-infectives (From admission, onward)   Start     Dose/Rate Route Frequency Ordered Stop   04/09/18 1800  piperacillin-tazobactam (ZOSYN) IVPB 3.375 g     3.375 g 12.5 mL/hr over 240 Minutes Intravenous Every 8 hours 04/09/18 1716     04/09/18 1430  cefTRIAXone (ROCEPHIN) 2 g in sodium chloride 0.9 % 100 mL IVPB     2 g 200 mL/hr over 30 Minutes Intravenous  Once 04/09/18 1427 04/09/18 1545       Objective: Vitals:   04/09/18 1835 04/09/18 2007 04/10/18 0433 04/10/18 1043  BP: (!) 166/92 (!) 144/77 99/76 110/69  Pulse: (!) 52 (!) 56 71 (!) 59  Resp: 17 19 19 17   Temp: 98.5 F (36.9 C) 99.2 F (37.3 C) 98.3 F (36.8 C) 98.4 F (36.9 C)  TempSrc: Oral Oral Oral Oral  SpO2: 99% 98% 95% 95%  Weight:      Height:        Intake/Output Summary (Last 24 hours) at 04/10/2018 1107 Last data filed at 04/10/2018 0750 Gross per 24 hour  Intake 715.52 ml  Output 600 ml  Net 115.52 ml   Filed Weights   04/09/18 1238  Weight: 77.1 kg    Examination:  General exam: Appears calm and comfortable  Respiratory system: Clear to auscultation. Respiratory effort normal. Cardiovascular system: S1 & S2 heard, RRR. No JVD, murmurs, rubs, gallops or clicks. No pedal edema. Gastrointestinal system: Abdomen is nondistended, soft and nontender. No organomegaly or masses felt. Normal bowel sounds heard. Central nervous system: Alert and oriented. No focal neurological deficits. Extremities: Symmetric 5 x 5 power. Skin: No rashes, lesions or ulcers Psychiatry: Judgement and insight appear normal. Mood &  affect appropriate.   Data Reviewed: I have personally reviewed following labs and imaging studies  CBC: Recent Labs  Lab 04/09/18 1240 04/10/18 0234  WBC 5.4 8.9  NEUTROABS 4.2  --   HGB 14.6 13.8  HCT 43.4 41.3  MCV 92.3 89.4  PLT 120* PLATELET CLUMPS NOTED ON SMEAR, COUNT APPEARS DECREASED   Basic  Metabolic Panel: Recent Labs  Lab 04/09/18 1240 04/10/18 0234  NA 136 136  K 4.1 5.0  CL 102 104  CO2 23 21*  GLUCOSE 151* 115*  BUN 19 17  CREATININE 1.14 0.95  CALCIUM 9.0 8.8*   GFR: Estimated Creatinine Clearance: 73.6 mL/min (by C-G formula based on SCr of 0.95 mg/dL). Liver Function Tests: Recent Labs  Lab 04/09/18 1240  AST 45*  ALT 41  ALKPHOS 82  BILITOT 0.7  PROT 6.3*  ALBUMIN 3.7   Recent Labs  Lab 04/10/18 0234  LIPASE 32   No results for input(s): AMMONIA in the last 168 hours. Coagulation Profile: No results for input(s): INR, PROTIME in the last 168 hours. Cardiac Enzymes: No results for input(s): CKTOTAL, CKMB, CKMBINDEX, TROPONINI in the last 168 hours. BNP (last 3 results) No results for input(s): PROBNP in the last 8760 hours. HbA1C: No results for input(s): HGBA1C in the last 72 hours. CBG: No results for input(s): GLUCAP in the last 168 hours. Lipid Profile: No results for input(s): CHOL, HDL, LDLCALC, TRIG, CHOLHDL, LDLDIRECT in the last 72 hours. Thyroid Function Tests: No results for input(s): TSH, T4TOTAL, FREET4, T3FREE, THYROIDAB in the last 72 hours. Anemia Panel: No results for input(s): VITAMINB12, FOLATE, FERRITIN, TIBC, IRON, RETICCTPCT in the last 72 hours. Sepsis Labs: No results for input(s): PROCALCITON, LATICACIDVEN in the last 168 hours.  No results found for this or any previous visit (from the past 240 hour(s)).     Radiology Studies: Dg Chest 2 View  Result Date: 04/09/2018 CLINICAL DATA:  Right chest pain and shortness of breath. EXAM: CHEST - 2 VIEW COMPARISON:  March 25, 2017 FINDINGS: The heart size and mediastinal contours are stable. Cardiac pacemaker is unchanged. Mild opacity is identified in the left lung base at least in part due to atelectasis but developing pneumonia is not excluded. The right lung is clear. The visualized skeletal structures are stable. IMPRESSION: Mild opacity is identified in the left  lung base at least in part due to atelectasis but developing pneumonia is not excluded. Electronically Signed   By: Sherian Rein M.D.   On: 04/09/2018 13:39   Ct Angio Chest Pe W/cm &/or Wo Cm  Result Date: 04/09/2018 CLINICAL DATA:  Right sided upper quadrant to lower abdominal pain with nausea since this morning. EXAM: CT ANGIOGRAPHY CHEST CT ABDOMEN AND PELVIS WITH CONTRAST TECHNIQUE: Multidetector CT imaging of the chest was performed using the standard protocol during bolus administration of intravenous contrast. Multiplanar CT image reconstructions and MIPs were obtained to evaluate the vascular anatomy. Multidetector CT imaging of the abdomen and pelvis was performed using the standard protocol during bolus administration of intravenous contrast. CONTRAST:  ISOVUE-370 IOPAMIDOL (ISOVUE-370) INJECTION 76% COMPARISON:  CT abdomen pelvis, 05/20/2004 FINDINGS: CTA CHEST FINDINGS Cardiovascular: There is satisfactory opacification of the pulmonary arteries to the segmental level. There is no evidence of a pulmonary embolism. Heart is top-normal in size. No pericardial effusion. There are dense three-vessel coronary artery calcifications. Great vessels are normal in caliber. Mild aortic atherosclerosis. Mediastinum/Nodes: No neck base, axillary, mediastinal or hilar masses or enlarged lymph nodes.  Trachea and esophagus are unremarkable. Lungs/Pleura: There is dependent opacity in the lower lobes and in the posterior inferior left upper lobe lingula, most likely atelectasis. 3 mm subpleural nodule, lateral right middle lobe, image 67, series 7, without significant change from the 2006 CT. No other nodules. There is no evidence of pulmonary edema. No pleural effusion or pneumothorax. Musculoskeletal: No fracture or acute finding. No osteoblastic or osteolytic lesions. Review of the MIP images confirms the above findings. CT ABDOMEN and PELVIS FINDINGS Hepatobiliary: Liver demonstrates morphologic changes  of cirrhosis, overall decreased size, with central volume loss and relative enlargement of the lateral segment of the left lobe and caudate lobe as well as nodularity. No discrete liver mass. Gallbladder is distended density suggested in the gallbladder neck which may reflect a small stone. There is no wall thickening. No pericholecystic fluid. No bile duct dilation. Pancreas: Unremarkable. No pancreatic ductal dilatation or surrounding inflammatory changes. Spleen: Spleen mildly enlarged measuring 14 x 5 x 11 cm. No splenic mass or focal lesion. Adrenals/Urinary Tract: No adrenal masses. Two small low-density masses, 6 mm, lower pole right and 5 mm, midpole left, consistent with small cysts or angiomyolipomas. No other renal masses. Nonobstructing stone midpole the left kidney. No hydronephrosis. Normal ureters. Normal bladder. Stomach/Bowel: Stomach is unremarkable. Small bowel is normal caliber. No wall thickening or inflammation. There are multiple left colon diverticula mostly along the sigmoid. No diverticulitis. No colon wall thickening or other inflammatory process. Normal appendix visualized. Vascular/Lymphatic: Dense aortic atherosclerosis. No aneurysm. There are prominent to mildly enlarged lymph nodes along the gastrohepatic ligament. Reproductive: Unremarkable Other: No hernia or ascites. Musculoskeletal: No fracture or acute finding. No osteoblastic or osteolytic lesions. Degenerative changes noted throughout the visualized spine. Review of the MIP images confirms the above findings. IMPRESSION: CTA CHEST 1. No evidence of a pulmonary embolism. 2. Dependent opacity in the lower lobes most consistent with atelectasis. Consider a component of pneumonia if there are consistent clinical findings. No evidence of pulmonary edema. 3. Coronary artery calcifications and aortic atherosclerosis. CT ABDOMEN AND PELVIS 1. Gallbladder is distended and there is a possible stone in the gallbladder neck, but there is  no wall thickening or adjacent inflammation. If clinical findings strongly support acute cholecystitis, consider follow-up right upper quadrant ultrasound for further assessment. 2. Cirrhosis.  Mild splenomegaly. 3. Mild gastrohepatic ligament adenopathy, presumed reactive in the setting of cirrhosis. 4. Dense aortic atherosclerosis. 5. Left colon diverticula without diverticulitis. Electronically Signed   By: Amie Portlandavid  Ormond M.D.   On: 04/09/2018 14:22   Ct Abdomen Pelvis W Contrast  Result Date: 04/09/2018 CLINICAL DATA:  Right sided upper quadrant to lower abdominal pain with nausea since this morning. EXAM: CT ANGIOGRAPHY CHEST CT ABDOMEN AND PELVIS WITH CONTRAST TECHNIQUE: Multidetector CT imaging of the chest was performed using the standard protocol during bolus administration of intravenous contrast. Multiplanar CT image reconstructions and MIPs were obtained to evaluate the vascular anatomy. Multidetector CT imaging of the abdomen and pelvis was performed using the standard protocol during bolus administration of intravenous contrast. CONTRAST:  100mL ISOVUE-370 IOPAMIDOL (ISOVUE-370) INJECTION 76% COMPARISON:  CT abdomen pelvis, 05/20/2004 FINDINGS: CTA CHEST FINDINGS Cardiovascular: There is satisfactory opacification of the pulmonary arteries to the segmental level. There is no evidence of a pulmonary embolism. Heart is top-normal in size. No pericardial effusion. There are dense three-vessel coronary artery calcifications. Great vessels are normal in caliber. Mild aortic atherosclerosis. Mediastinum/Nodes: No neck base, axillary, mediastinal or hilar masses or enlarged lymph nodes.  Trachea and esophagus are unremarkable. Lungs/Pleura: There is dependent opacity in the lower lobes and in the posterior inferior left upper lobe lingula, most likely atelectasis. 3 mm subpleural nodule, lateral right middle lobe, image 67, series 7, without significant change from the 2006 CT. No other nodules. There is no  evidence of pulmonary edema. No pleural effusion or pneumothorax. Musculoskeletal: No fracture or acute finding. No osteoblastic or osteolytic lesions. Review of the MIP images confirms the above findings. CT ABDOMEN and PELVIS FINDINGS Hepatobiliary: Liver demonstrates morphologic changes of cirrhosis, overall decreased size, with central volume loss and relative enlargement of the lateral segment of the left lobe and caudate lobe as well as nodularity. No discrete liver mass. Gallbladder is distended density suggested in the gallbladder neck which may reflect a small stone. There is no wall thickening. No pericholecystic fluid. No bile duct dilation. Pancreas: Unremarkable. No pancreatic ductal dilatation or surrounding inflammatory changes. Spleen: Spleen mildly enlarged measuring 14 x 5 x 11 cm. No splenic mass or focal lesion. Adrenals/Urinary Tract: No adrenal masses. Two small low-density masses, 6 mm, lower pole right and 5 mm, midpole left, consistent with small cysts or angiomyolipomas. No other renal masses. Nonobstructing stone midpole the left kidney. No hydronephrosis. Normal ureters. Normal bladder. Stomach/Bowel: Stomach is unremarkable. Small bowel is normal caliber. No wall thickening or inflammation. There are multiple left colon diverticula mostly along the sigmoid. No diverticulitis. No colon wall thickening or other inflammatory process. Normal appendix visualized. Vascular/Lymphatic: Dense aortic atherosclerosis. No aneurysm. There are prominent to mildly enlarged lymph nodes along the gastrohepatic ligament. Reproductive: Unremarkable Other: No hernia or ascites. Musculoskeletal: No fracture or acute finding. No osteoblastic or osteolytic lesions. Degenerative changes noted throughout the visualized spine. Review of the MIP images confirms the above findings. IMPRESSION: CTA CHEST 1. No evidence of a pulmonary embolism. 2. Dependent opacity in the lower lobes most consistent with atelectasis.  Consider a component of pneumonia if there are consistent clinical findings. No evidence of pulmonary edema. 3. Coronary artery calcifications and aortic atherosclerosis. CT ABDOMEN AND PELVIS 1. Gallbladder is distended and there is a possible stone in the gallbladder neck, but there is no wall thickening or adjacent inflammation. If clinical findings strongly support acute cholecystitis, consider follow-up right upper quadrant ultrasound for further assessment. 2. Cirrhosis.  Mild splenomegaly. 3. Mild gastrohepatic ligament adenopathy, presumed reactive in the setting of cirrhosis. 4. Dense aortic atherosclerosis. 5. Left colon diverticula without diverticulitis. Electronically Signed   By: Amie Portland M.D.   On: 04/09/2018 14:22   US Abdomen Limited Ruq  Result Date: 04/09/2018 CLINICAL DATA:  Right upper quadrant pain. EXAM: ULTRASOUND ABDOMEN LIMITED RIGHT UPPER QUADRANT COMPARISON:  None. FINDINGS: Gallbladder: The gallbladder is distended without wall thickening. The wall measures 2 mm. There may be a tiny amount of pericholecystic fluid. A positive Murphy's sign is reported. There is sludge in the gallbladder with no stones. Common bile duct: Diameter: 6.3 mm Liver: A nodular contour and heterogeneous echotexture of the liver is consistent with known cirrhosis. No focal mass. The umbilical vein is recannulized. The portal vein is patent but appears to contain bidirectional flow. IMPRESSION: 1. Findings associated with the gallbladder are mixed. Gallbladder distension, gallbladder sludge, a positive Murphy's sign, and the possibility of minimal pericholecystic fluid all raise the possibility of acute cholecystitis. However, there is no wall thickening or stones. A HIDA scan could further evaluate as clinically warranted. 2. Cirrhotic liver. 3. Recannulization of the umbilical vein and bidirectional flow  in the portal vein are consistent with portal venous hypertension. 4. The common bile duct is  borderline in caliber. Recommend correlation with labs. Electronically Signed   By: Gerome Samavid  Williams III M.D   On: 04/09/2018 15:58      Scheduled Meds: . aspirin EC  81 mg Oral Daily  . atorvastatin  80 mg Oral QPM  . cycloSPORINE  1 drop Both Eyes BID  . gabapentin  200 mg Oral QHS  . heparin  5,000 Units Subcutaneous Q8H  . isosorbide mononitrate  30 mg Oral Daily  . levothyroxine  88 mcg Oral QAC breakfast  . metoprolol succinate  50 mg Oral Daily  . morphine  30 mg Oral BID  . morphine  45 mg Oral Q breakfast  . sacubitril-valsartan  1 tablet Oral BID  . traZODone  25 mg Oral QHS   Continuous Infusions: . dextrose 5 % and 0.9% NaCl 50 mL/hr at 04/10/18 0645  . famotidine (PEPCID) IV Stopped (04/09/18 2016)  . piperacillin-tazobactam (ZOSYN)  IV 3.375 g (04/10/18 1051)     LOS: 1 day    Time spent: 35 minutes   Noralee StainJennifer Harjas Biggins, DO [facility] [email or web address] 04/10/2018, 11:07 AM

## 2018-04-11 DIAGNOSIS — R1011 Right upper quadrant pain: Secondary | ICD-10-CM

## 2018-04-11 LAB — COMPREHENSIVE METABOLIC PANEL
ALT: 31 U/L (ref 0–44)
AST: 40 U/L (ref 15–41)
Albumin: 3.1 g/dL — ABNORMAL LOW (ref 3.5–5.0)
Alkaline Phosphatase: 59 U/L (ref 38–126)
Anion gap: 7 (ref 5–15)
BUN: 11 mg/dL (ref 8–23)
CO2: 23 mmol/L (ref 22–32)
Calcium: 8.3 mg/dL — ABNORMAL LOW (ref 8.9–10.3)
Chloride: 107 mmol/L (ref 98–111)
Creatinine, Ser: 1.18 mg/dL (ref 0.61–1.24)
GFR calc Af Amer: >60 mL/min (ref >60)
GFR calc non Af Amer: >60 mL/min (ref >60)
GLUCOSE: 95 mg/dL (ref 70–99)
Potassium: 4.3 mmol/L (ref 3.5–5.1)
Sodium: 137 mmol/L (ref 135–145)
Total Bilirubin: 0.9 mg/dL (ref 0.3–1.2)
Total Protein: 5.3 g/dL — ABNORMAL LOW (ref 6.5–8.1)

## 2018-04-11 LAB — CBC
HCT: 38.2 % — ABNORMAL LOW (ref 39.0–52.0)
Hemoglobin: 12.5 g/dL — ABNORMAL LOW (ref 13.0–17.0)
MCH: 29.5 pg (ref 26.0–34.0)
MCHC: 32.7 g/dL (ref 30.0–36.0)
MCV: 90.1 fL (ref 80.0–100.0)
PLATELETS: 90 10*3/uL — AB (ref 150–400)
RBC: 4.24 MIL/uL (ref 4.22–5.81)
RDW: 12.9 % (ref 11.5–15.5)
WBC: 4.8 10*3/uL (ref 4.0–10.5)
nRBC: 0 % (ref 0.0–0.2)

## 2018-04-11 MED ORDER — SODIUM CHLORIDE 0.9 % IV SOLN
INTRAVENOUS | Status: DC | PRN
  Administered 2018-04-11: 250 mL via INTRAVENOUS

## 2018-04-11 MED ORDER — FAMOTIDINE 20 MG PO TABS: 20.0000 mg | ORAL_TABLET | Freq: Every day | ORAL | Status: DC

## 2018-04-11 NOTE — Discharge Summary (Signed)
Physician Discharge Summary  Tommy Robbins ZOX:[id number] DOB: 1945-09-18 DOA: 04/09/2018  PCP: Dorena Bodo, PA-C  Admit date: 04/09/2018 Discharge date: 04/11/2018  Admitted From: Home Disposition:  Home  Recommendations for Outpatient Follow-up:  1. Follow up with PCP in 1 week 2. Follow up with Cardiology as scheduled   Discharge Condition: Stable CODE STATUS: Full  Diet recommendation: Heart healthy   Brief/Interim Summary: Tommy Robbins a 73 y.o.malewith medical history significant ofhypertension and coronary disease s/p ventricular fibrillation cardiac arrest,as well as AICD.Patient presents with right upper quad abdominal pain, for the last about 8 hours, persistent, dull in nature, 10 out of 10 in intensity, no radiation, associated with nausea but no vomiting,no improving or worsening factors. Patient was evaluated with CT of the abdomen and pelvis, plusabdominal ultrasonography.Suggesting acute cholecystitis due to gallbladder distention, possible stone but there was no gallbladder thickening. General surgery was consulted.  Cardiology also consulted for preop clearance and high risk patient.  HIDA scan was completed which was negative for acute cholecystitis.  Patient's diet was advanced prior to discharge home.  On day of discharge, he denied any further nausea, vomiting or abdominal pain.  Acute cholecystitis -CT abdomen pelvis with gallbladder distention -Right upper quadrant ultrasound with gallbladder distention, gallbladder sludge, positive Murphy sign, possible pericholecystic fluid but no wall thickening or stones -HIDA scan negative for acute cholecystitis -General surgery consulted, no further role for surgical intervention as HIDA scan was negative  Ischemic cardiomyopathy status post AICD/chronic systolic heart failure -Continue home meds including imdur, metoprolol, Entresto, aspirin, Lasix, spironolactone -Appreciate cardiology consultation, patient  to follow-up with them outpatient  Hyperlipidemia -Continue Lipitor  Left basilar atelectasis -Doubt pneumonia, no clinical symptoms to indicate pneumonia, afebrile, WBC normal, no cough. Continue to monitor  Hypothyroidism -Continue Synthroid  Depression -Continue trazodone  Cirrhosis -Seen on CT abdomen pelvis, follow-up outpatient  Discharge Instructions  Discharge Instructions    (HEART FAILURE PATIENTS) Call MD:  Anytime you have any of the following symptoms: 1) 3 pound weight gain in 24 hours or 5 pounds in 1 week 2) shortness of breath, with or without a dry hacking cough 3) swelling in the hands, feet or stomach 4) if you have to sleep on extra pillows at night in order to breathe.   Complete by:  As directed    Call MD for:  difficulty breathing, headache or visual disturbances   Complete by:  As directed    Call MD for:  extreme fatigue   Complete by:  As directed    Call MD for:  hives   Complete by:  As directed    Call MD for:  persistant dizziness or light-headedness   Complete by:  As directed    Call MD for:  persistant nausea and vomiting   Complete by:  As directed    Call MD for:  severe uncontrolled pain   Complete by:  As directed    Call MD for:  temperature >100.4   Complete by:  As directed    Diet - low sodium heart healthy   Complete by:  As directed    Discharge instructions   Complete by:  As directed    You were cared for by a hospitalist during your hospital stay. If you have any questions about your discharge medications or the care you received while you were in the hospital after you are discharged, you can call the unit and ask to speak with the hospitalist on call if  the hospitalist that took care of you is not available. Once you are discharged, your primary care physician will handle any further medical issues. Please note that NO REFILLS for any discharge medications will be authorized once you are discharged, as it is imperative that  you return to your primary care physician (or establish a relationship with a primary care physician if you do not have one) for your aftercare needs so that they can reassess your need for medications and monitor your lab values.   Increase activity slowly   Complete by:  As directed      Allergies as of 04/11/2018      Reactions   Other Other (See Comments)   Seasonal allergies - sniffling       Medication List    TAKE these medications   aspirin EC 81 MG tablet Take 81 mg by mouth daily.   atorvastatin 80 MG tablet Commonly known as:  LIPITOR TAKE 1 TABLET DAILY AT 6PM What changed:  See the new instructions.   cycloSPORINE 0.05 % ophthalmic emulsion Commonly known as:  RESTASIS Place 1 drop into both eyes 2 (two) times daily.   diclofenac sodium 1 % Gel Commonly known as:  VOLTAREN Apply 1 application topically daily as needed (for pain).   ENTRESTO 24-26 MG Generic drug:  sacubitril-valsartan TAKE 1 TABLET BY MOUTH TWICE A DAY   furosemide 20 MG tablet Commonly known as:  LASIX Take 1 tablet (20 mg total) by mouth daily as needed. What changed:  reasons to take this   gabapentin 100 MG capsule Commonly known as:  NEURONTIN Take 200 mg by mouth at bedtime.   isosorbide mononitrate 30 MG 24 hr tablet Commonly known as:  IMDUR TAKE 1 TABLET BY MOUTH EVERY DAY   JUICE PLUS FIBRE PO Take 2 capsules by mouth 2 (two) times daily.   lidocaine 5 % Commonly known as:  LIDODERM Place 3 patches onto the skin daily as needed (pain).   metoprolol succinate 50 MG 24 hr tablet Commonly known as:  TOPROL-XL Take one tablet daily with food What changed:    how much to take  how to take this  when to take this   morphine 30 MG 12 hr tablet Commonly known as:  MS CONTIN Take 30 mg by mouth See admin instructions. Takes 30 mg by mouth in the morning with 15 mg to equal 45 mg dose, 30 mg in the afternoon, and 30 mg at night   morphine 15 MG 12 hr tablet Commonly  known as:  MS CONTIN Take 15 mg by mouth See admin instructions. Take 15 mg by mouth in the morning with 30 mg to equal 45 mg dose   nitroGLYCERIN 0.4 MG SL tablet Commonly known as:  NITROSTAT Place 1 tablet (0.4 mg total) under the tongue every 5 (five) minutes as needed for chest pain.   pantoprazole 40 MG tablet Commonly known as:  PROTONIX TAKE 1 TABLET DAILY What changed:  when to take this   spironolactone 25 MG tablet Commonly known as:  ALDACTONE Take 0.5 tablets (12.5 mg total) by mouth daily.   SYNTHROID 88 MCG tablet Generic drug:  levothyroxine TAKE 1 TABLET DAILY BEFORE BREAKFAST What changed:  how much to take   temazepam 15 MG capsule Commonly known as:  RESTORIL Take 1 capsule (15 mg total) by mouth at bedtime as needed for sleep. What changed:  when to take this   traZODone 50 MG tablet Commonly known  as:  DESYREL Take 25 mg by mouth at bedtime.      Follow-up Information    Dorena Bodo, PA-C. Schedule an appointment as soon as possible for a visit in 1 week(s).   Specialty:  Physician Assistant Contact information: [address] [place] [address] [phone]          Allergies  Allergen Reactions  . Other Other (See Comments)    Seasonal allergies - sniffling     Consultations:  General surgery  Cardiology   Procedures/Studies: Dg Chest 2 View  Result Date: 04/09/2018 CLINICAL DATA:  Right chest pain and shortness of breath. EXAM: CHEST - 2 VIEW COMPARISON:  March 25, 2017 FINDINGS: The heart size and mediastinal contours are stable. Cardiac pacemaker is unchanged. Mild opacity is identified in the left lung base at least in part due to atelectasis but developing pneumonia is not excluded. The right lung is clear. The visualized skeletal structures are stable. IMPRESSION: Mild opacity is identified in the left lung base at least in part due to atelectasis but developing pneumonia is not excluded. Electronically Signed    By: Sherian Rein M.D.   On: 04/09/2018 13:39   Ct Angio Chest Pe W/cm &/or Wo Cm  Result Date: 04/09/2018 CLINICAL DATA:  Right sided upper quadrant to lower abdominal pain with nausea since this morning. EXAM: CT ANGIOGRAPHY CHEST CT ABDOMEN AND PELVIS WITH CONTRAST TECHNIQUE: Multidetector CT imaging of the chest was performed using the standard protocol during bolus administration of intravenous contrast. Multiplanar CT image reconstructions and MIPs were obtained to evaluate the vascular anatomy. Multidetector CT imaging of the abdomen and pelvis was performed using the standard protocol during bolus administration of intravenous contrast. CONTRAST:  ISOVUE-370 IOPAMIDOL (ISOVUE-370) INJECTION 76% COMPARISON:  CT abdomen pelvis, 05/20/2004 FINDINGS: CTA CHEST FINDINGS Cardiovascular: There is satisfactory opacification of the pulmonary arteries to the segmental level. There is no evidence of a pulmonary embolism. Heart is top-normal in size. No pericardial effusion. There are dense three-vessel coronary artery calcifications. Great vessels are normal in caliber. Mild aortic atherosclerosis. Mediastinum/Nodes: No neck base, axillary, mediastinal or hilar masses or enlarged lymph nodes. Trachea and esophagus are unremarkable. Lungs/Pleura: There is dependent opacity in the lower lobes and in the posterior inferior left upper lobe lingula, most likely atelectasis. 3 mm subpleural nodule, lateral right middle lobe, image 67, series 7, without significant change from the 2006 CT. No other nodules. There is no evidence of pulmonary edema. No pleural effusion or pneumothorax. Musculoskeletal: No fracture or acute finding. No osteoblastic or osteolytic lesions. Review of the MIP images confirms the above findings. CT ABDOMEN and PELVIS FINDINGS Hepatobiliary: Liver demonstrates morphologic changes of cirrhosis, overall decreased size, with central volume loss and relative enlargement of the lateral segment of  the left lobe and caudate lobe as well as nodularity. No discrete liver mass. Gallbladder is distended density suggested in the gallbladder neck which may reflect a small stone. There is no wall thickening. No pericholecystic fluid. No bile duct dilation. Pancreas: Unremarkable. No pancreatic ductal dilatation or surrounding inflammatory changes. Spleen: Spleen mildly enlarged measuring 14 x 5 x 11 cm. No splenic mass or focal lesion. Adrenals/Urinary Tract: No adrenal masses. Two small low-density masses, 6 mm, lower pole right and 5 mm, midpole left, consistent with small cysts or angiomyolipomas. No other renal masses. Nonobstructing stone midpole the left kidney. No hydronephrosis. Normal ureters. Normal bladder. Stomach/Bowel: Stomach is unremarkable. Small bowel is normal caliber.  No wall thickening or inflammation. There are multiple left colon diverticula mostly along the sigmoid. No diverticulitis. No colon wall thickening or other inflammatory process. Normal appendix visualized. Vascular/Lymphatic: Dense aortic atherosclerosis. No aneurysm. There are prominent to mildly enlarged lymph nodes along the gastrohepatic ligament. Reproductive: Unremarkable Other: No hernia or ascites. Musculoskeletal: No fracture or acute finding. No osteoblastic or osteolytic lesions. Degenerative changes noted throughout the visualized spine. Review of the MIP images confirms the above findings. IMPRESSION: CTA CHEST 1. No evidence of a pulmonary embolism. 2. Dependent opacity in the lower lobes most consistent with atelectasis. Consider a component of pneumonia if there are consistent clinical findings. No evidence of pulmonary edema. 3. Coronary artery calcifications and aortic atherosclerosis. CT ABDOMEN AND PELVIS 1. Gallbladder is distended and there is a possible stone in the gallbladder neck, but there is no wall thickening or adjacent inflammation. If clinical findings strongly support acute cholecystitis, consider  follow-up right upper quadrant ultrasound for further assessment. 2. Cirrhosis.  Mild splenomegaly. 3. Mild gastrohepatic ligament adenopathy, presumed reactive in the setting of cirrhosis. 4. Dense aortic atherosclerosis. 5. Left colon diverticula without diverticulitis. Electronically Signed   By: Amie Portland M.D.   On: 04/09/2018 14:22   Nm Hepatobiliary Liver Func  Result Date: 04/10/2018 CLINICAL DATA:  Inpatient.  Right upper quadrant abdominal pain. EXAM: NUCLEAR MEDICINE HEPATOBILIARY IMAGING TECHNIQUE: Sequential images of the abdomen were obtained out to 60 minutes following intravenous administration of radiopharmaceutical. RADIOPHARMACEUTICALS:  5.4 mCi Tc-19m  Choletec IV COMPARISON:  04/09/2018 abdominal sonogram and CT abdomen/pelvis. FINDINGS: Prompt uptake and biliary excretion of activity by the liver is seen. Gallbladder activity is visualized after administration of IV morphine, consistent with patency of cystic duct. Biliary activity passes into small bowel, consistent with patent common bile duct. IMPRESSION: 1. Gallbladder activity is visualized, indicative of patent cystic duct, which is not compatible with acute cholecystitis. 2. Patent common bile duct. Electronically Signed   By: Delbert Phenix M.D.   On: 04/10/2018 11:14   Ct Abdomen Pelvis W Contrast  Result Date: 04/09/2018 CLINICAL DATA:  Right sided upper quadrant to lower abdominal pain with nausea since this morning. EXAM: CT ANGIOGRAPHY CHEST CT ABDOMEN AND PELVIS WITH CONTRAST TECHNIQUE: Multidetector CT imaging of the chest was performed using the standard protocol during bolus administration of intravenous contrast. Multiplanar CT image reconstructions and MIPs were obtained to evaluate the vascular anatomy. Multidetector CT imaging of the abdomen and pelvis was performed using the standard protocol during bolus administration of intravenous contrast. CONTRAST:  ISOVUE-370 IOPAMIDOL (ISOVUE-370) INJECTION 76%  COMPARISON:  CT abdomen pelvis, 05/20/2004 FINDINGS: CTA CHEST FINDINGS Cardiovascular: There is satisfactory opacification of the pulmonary arteries to the segmental level. There is no evidence of a pulmonary embolism. Heart is top-normal in size. No pericardial effusion. There are dense three-vessel coronary artery calcifications. Great vessels are normal in caliber. Mild aortic atherosclerosis. Mediastinum/Nodes: No neck base, axillary, mediastinal or hilar masses or enlarged lymph nodes. Trachea and esophagus are unremarkable. Lungs/Pleura: There is dependent opacity in the lower lobes and in the posterior inferior left upper lobe lingula, most likely atelectasis. 3 mm subpleural nodule, lateral right middle lobe, image 67, series 7, without significant change from the 2006 CT. No other nodules. There is no evidence of pulmonary edema. No pleural effusion or pneumothorax. Musculoskeletal: No fracture or acute finding. No osteoblastic or osteolytic lesions. Review of the MIP images confirms the above findings. CT ABDOMEN and PELVIS FINDINGS Hepatobiliary: Liver demonstrates morphologic changes  of cirrhosis, overall decreased size, with central volume loss and relative enlargement of the lateral segment of the left lobe and caudate lobe as well as nodularity. No discrete liver mass. Gallbladder is distended density suggested in the gallbladder neck which may reflect a small stone. There is no wall thickening. No pericholecystic fluid. No bile duct dilation. Pancreas: Unremarkable. No pancreatic ductal dilatation or surrounding inflammatory changes. Spleen: Spleen mildly enlarged measuring 14 x 5 x 11 cm. No splenic mass or focal lesion. Adrenals/Urinary Tract: No adrenal masses. Two small low-density masses, 6 mm, lower pole right and 5 mm, midpole left, consistent with small cysts or angiomyolipomas. No other renal masses. Nonobstructing stone midpole the left kidney. No hydronephrosis. Normal ureters. Normal  bladder. Stomach/Bowel: Stomach is unremarkable. Small bowel is normal caliber. No wall thickening or inflammation. There are multiple left colon diverticula mostly along the sigmoid. No diverticulitis. No colon wall thickening or other inflammatory process. Normal appendix visualized. Vascular/Lymphatic: Dense aortic atherosclerosis. No aneurysm. There are prominent to mildly enlarged lymph nodes along the gastrohepatic ligament. Reproductive: Unremarkable Other: No hernia or ascites. Musculoskeletal: No fracture or acute finding. No osteoblastic or osteolytic lesions. Degenerative changes noted throughout the visualized spine. Review of the MIP images confirms the above findings. IMPRESSION: CTA CHEST 1. No evidence of a pulmonary embolism. 2. Dependent opacity in the lower lobes most consistent with atelectasis. Consider a component of pneumonia if there are consistent clinical findings. No evidence of pulmonary edema. 3. Coronary artery calcifications and aortic atherosclerosis. CT ABDOMEN AND PELVIS 1. Gallbladder is distended and there is a possible stone in the gallbladder neck, but there is no wall thickening or adjacent inflammation. If clinical findings strongly support acute cholecystitis, consider follow-up right upper quadrant ultrasound for further assessment. 2. Cirrhosis.  Mild splenomegaly. 3. Mild gastrohepatic ligament adenopathy, presumed reactive in the setting of cirrhosis. 4. Dense aortic atherosclerosis. 5. Left colon diverticula without diverticulitis. Electronically Signed   By: Amie Portland M.D.   On: 04/09/2018 14:22   US Abdomen Limited Ruq  Result Date: 04/09/2018 CLINICAL DATA:  Right upper quadrant pain. EXAM: ULTRASOUND ABDOMEN LIMITED RIGHT UPPER QUADRANT COMPARISON:  None. FINDINGS: Gallbladder: The gallbladder is distended without wall thickening. The wall measures 2 mm. There may be a tiny amount of pericholecystic fluid. A positive Murphy's sign is reported. There is sludge  in the gallbladder with no stones. Common bile duct: Diameter: 6.3 mm Liver: A nodular contour and heterogeneous echotexture of the liver is consistent with known cirrhosis. No focal mass. The umbilical vein is recannulized. The portal vein is patent but appears to contain bidirectional flow. IMPRESSION: 1. Findings associated with the gallbladder are mixed. Gallbladder distension, gallbladder sludge, a positive Murphy's sign, and the possibility of minimal pericholecystic fluid all raise the possibility of acute cholecystitis. However, there is no wall thickening or stones. A HIDA scan could further evaluate as clinically warranted. 2. Cirrhotic liver. 3. Recannulization of the umbilical vein and bidirectional flow in the portal vein are consistent with portal venous hypertension. 4. The common bile duct is borderline in caliber. Recommend correlation with labs. Electronically Signed   By: Gerome Sam III M.D   On: 04/09/2018 15:58       Discharge Exam: Vitals:   04/10/18 2058 04/11/18 0438  BP: 93/65 99/78  Pulse: (!) 54 71  Resp: 19 19  Temp: (!) 97.4 F (36.3 C) 97.7 F (36.5 C)  SpO2: 96% 97%    General: Pt is alert, awake, not  in acute distress Cardiovascular: RRR, S1/S2 +, no rubs, no gallops Respiratory: CTA bilaterally, no wheezing, no rhonchi Abdominal: Soft, NT, ND, bowel sounds + Extremities: no edema, no cyanosis    The results of significant diagnostics from this hospitalization (including imaging, microbiology, ancillary and laboratory) are listed below for reference.     Microbiology: No results found for this or any previous visit (from the past 240 hour(s)).   Labs: BNP (last 3 results) No results for input(s): BNP in the last 8760 hours. Basic Metabolic Panel: Recent Labs  Lab 04/09/18 1240 04/10/18 0234 04/11/18 0235  NA 136 136 137  K 4.1 5.0 4.3  CL 102 104 107  CO2 23 21* 23  GLUCOSE 151* 115* 95  BUN 19 17 11   CREATININE 1.14 0.95 1.18   CALCIUM 9.0 8.8* 8.3*   Liver Function Tests: Recent Labs  Lab 04/09/18 1240 04/11/18 0235  AST 45* 40  ALT 41 31  ALKPHOS 82 59  BILITOT 0.7 0.9  PROT 6.3* 5.3*  ALBUMIN 3.7 3.1*   Recent Labs  Lab 04/10/18 0234  LIPASE 32   No results for input(s): AMMONIA in the last 168 hours. CBC: Recent Labs  Lab 04/09/18 1240 04/10/18 0234 04/11/18 0235  WBC 5.4 8.9 4.8  NEUTROABS 4.2  --   --   HGB 14.6 13.8 12.5*  HCT 43.4 41.3 38.2*  MCV 92.3 89.4 90.1  PLT 120* PLATELET CLUMPS NOTED ON SMEAR, COUNT APPEARS DECREASED 90*   Cardiac Enzymes: No results for input(s): CKTOTAL, CKMB, CKMBINDEX, TROPONINI in the last 168 hours. BNP: Invalid input(s): POCBNP CBG: No results for input(s): GLUCAP in the last 168 hours. D-Dimer Recent Labs    04/09/18 1240  DDIMER 0.72*   Hgb A1c No results for input(s): HGBA1C in the last 72 hours. Lipid Profile No results for input(s): CHOL, HDL, LDLCALC, TRIG, CHOLHDL, LDLDIRECT in the last 72 hours. Thyroid function studies No results for input(s): TSH, T4TOTAL, T3FREE, THYROIDAB in the last 72 hours.  Invalid input(s): FREET3 Anemia work up No results for input(s): VITAMINB12, FOLATE, FERRITIN, TIBC, IRON, RETICCTPCT in the last 72 hours. Urinalysis    Component Value Date/Time   COLORURINE YELLOW 02/27/2009 1321   APPEARANCEUR CLEAR 02/27/2009 1321   LABSPEC 1.018 02/27/2009 1321   PHURINE 7.0 02/27/2009 1321   GLUCOSEU NEGATIVE 02/27/2009 1321   HGBUR NEGATIVE 02/27/2009 1321   BILIRUBINUR NEGATIVE 02/27/2009 1321   KETONESUR NEGATIVE 02/27/2009 1321   PROTEINUR NEGATIVE 02/27/2009 1321   UROBILINOGEN 1.0 02/27/2009 1321   NITRITE NEGATIVE 02/27/2009 1321   LEUKOCYTESUR SMALL (A) 02/27/2009 1321   Sepsis Labs Invalid input(s): PROCALCITONIN,  WBC,  LACTICIDVEN Microbiology No results found for this or any previous visit (from the past 240 hour(s)).   Patient was seen and examined on the day of discharge and was  found to be in stable condition. Time coordinating discharge: 25 minutes including assessment and coordination of care, as well as examination of the patient.   SIGNED:  Noralee StainJennifer Deronda Christian, DO [facility] [email or web address] 04/11/2018, 10:42 AM

## 2018-04-11 NOTE — Progress Notes (Signed)
Tommy Robbins to be D/C'd  per MD order. Discussed with the patient and all questions fully answered.  VSS, Skin clean, dry and intact without evidence of skin break down, no evidence of skin tears noted.  IV catheter discontinued intact. Site without signs and symptoms of complications. Dressing and pressure applied.  An After Visit Summary was printed and given to the patient. Patient received prescription.  D/c education completed with patient/family including follow up instructions, medication list, d/c activities limitations if indicated, with other d/c instructions as indicated by MD - patient able to verbalize understanding, all questions fully answered.   Patient instructed to return to ED, call 911, or call MD for any changes in condition.   Patient to be escorted via WC, and D/C home via private auto.

## 2018-04-11 NOTE — Progress Notes (Addendum)
Progress Note  Patient Name: Tommy MinerRichard M Robbins Date of Encounter: 04/11/2018  Primary Cardiologist: Nanetta BattyJonathan Berry, MD  Subjective   Feeling overall much better. No CP, SOB, orthopnea, LEE, N/V.  Inpatient Medications    Scheduled Meds: . aspirin EC  81 mg Oral Daily  . atorvastatin  80 mg Oral QPM  . cycloSPORINE  1 drop Both Eyes BID  . gabapentin  200 mg Oral QHS  . heparin  5,000 Units Subcutaneous Q8H  . isosorbide mononitrate  30 mg Oral Daily  . levothyroxine  88 mcg Oral QAC breakfast  . metoprolol succinate  50 mg Oral Daily  . morphine  30 mg Oral BID  . morphine  45 mg Oral Q breakfast  . sacubitril-valsartan  1 tablet Oral BID  . traZODone  25 mg Oral QHS   Continuous Infusions: . sodium chloride 10 mL/hr at 04/11/18 0640  . dextrose 5 % and 0.9% NaCl 50 mL/hr at 04/11/18 0640  . famotidine (PEPCID) IV 20 mg (04/10/18 1948)  . piperacillin-tazobactam (ZOSYN)  IV 3.375 g (04/11/18 0139)   PRN Meds: sodium chloride, acetaminophen **OR** acetaminophen, morphine injection, nitroGLYCERIN, ondansetron **OR** ondansetron (ZOFRAN) IV   Vital Signs    Vitals:   04/10/18 0433 04/10/18 1043 04/10/18 2058 04/11/18 0438  BP: 99/76 110/69 93/65 99/78   Pulse: 71 (!) 59 (!) 54 71  Resp: 19 17 19 19   Temp: 98.3 F (36.8 C) 98.4 F (36.9 C) (!) 97.4 F (36.3 C) 97.7 F (36.5 C)  TempSrc: Oral Oral Oral Oral  SpO2: 95% 95% 96% 97%  Weight:      Height:        Intake/Output Summary (Last 24 hours) at 04/11/2018 0842 Last data filed at 04/11/2018 0640 Gross per 24 hour  Intake 2238.09 ml  Output 725 ml  Net 1513.09 ml   Last 3 Weights 04/09/2018 01/11/2018 11/10/2017  Weight (lbs) 170 lb 173 lb 6.4 oz 165 lb 8 oz  Weight (kg) 77.111 kg 78.654 kg 75.07 kg     Telemetry    NSR. Overnight SB upper 40s at times, no significant drops- Personally Reviewed  Physical Exam   GEN: No acute distress, WM.  HEENT: Normocephalic, atraumatic, sclera non-icteric. Neck: No  JVD or bruits. Cardiac: RRR no murmurs, rubs, or gallops.  Radials/DP/PT 1+ and equal bilaterally.  Respiratory: Clear to auscultation bilaterally. Breathing is unlabored. GI: Soft, nontender, non-distended, BS +x 4. MS: no deformity. Extremities: No clubbing or cyanosis. No edema. Distal pedal pulses are 2+ and equal bilaterally. Neuro:  AAOx3. Follows commands. Psych:  Responds to questions appropriately with a normal affect.  Labs    Chemistry Recent Labs  Lab 04/09/18 1240 04/10/18 0234 04/11/18 0235  NA 136 136 137  K 4.1 5.0 4.3  CL 102 104 107  CO2 23 21* 23  GLUCOSE 151* 115* 95  BUN 19 17 11   CREATININE 1.14 0.95 1.18  CALCIUM 9.0 8.8* 8.3*  PROT 6.3*  --  5.3*  ALBUMIN 3.7  --  3.1*  AST 45*  --  40  ALT 41  --  31  ALKPHOS 82  --  59  BILITOT 0.7  --  0.9  GFRNONAA >60 >60 >60  GFRAA >60 >60 >60  ANIONGAP 11 11 7      Hematology Recent Labs  Lab 04/09/18 1240 04/10/18 0234 04/11/18 0235  WBC 5.4 8.9 4.8  RBC 4.70 4.62 4.24  HGB 14.6 13.8 12.5*  HCT 43.4 41.3 38.2*  MCV 92.3 89.4 90.1  MCH 31.1 29.9 29.5  MCHC 33.6 33.4 32.7  RDW 12.7 12.7 12.9  PLT 120* PLATELET CLUMPS NOTED ON SMEAR, COUNT APPEARS DECREASED 90*    Cardiac EnzymesNo results for input(s): TROPONINI in the last 168 hours.  Recent Labs  Lab 04/09/18 1245  TROPIPOC 0.01     BNPNo results for input(s): BNP, PROBNP in the last 168 hours.   DDimer  Recent Labs  Lab 04/09/18 1240  DDIMER 0.72*     Radiology    Dg Chest 2 View  Result Date: 04/09/2018 CLINICAL DATA:  Right chest pain and shortness of breath. EXAM: CHEST - 2 VIEW COMPARISON:  March 25, 2017 FINDINGS: The heart size and mediastinal contours are stable. Cardiac pacemaker is unchanged. Mild opacity is identified in the left lung base at least in part due to atelectasis but developing pneumonia is not excluded. The right lung is clear. The visualized skeletal structures are stable. IMPRESSION: Mild opacity is  identified in the left lung base at least in part due to atelectasis but developing pneumonia is not excluded. Electronically Signed   By: Sherian Rein M.D.   On: 04/09/2018 13:39   Ct Angio Chest Pe W/cm &/or Wo Cm  Result Date: 04/09/2018 CLINICAL DATA:  Right sided upper quadrant to lower abdominal pain with nausea since this morning. EXAM: CT ANGIOGRAPHY CHEST CT ABDOMEN AND PELVIS WITH CONTRAST TECHNIQUE: Multidetector CT imaging of the chest was performed using the standard protocol during bolus administration of intravenous contrast. Multiplanar CT image reconstructions and MIPs were obtained to evaluate the vascular anatomy. Multidetector CT imaging of the abdomen and pelvis was performed using the standard protocol during bolus administration of intravenous contrast. CONTRAST:  ISOVUE-370 IOPAMIDOL (ISOVUE-370) INJECTION 76% COMPARISON:  CT abdomen pelvis, 05/20/2004 FINDINGS: CTA CHEST FINDINGS Cardiovascular: There is satisfactory opacification of the pulmonary arteries to the segmental level. There is no evidence of a pulmonary embolism. Heart is top-normal in size. No pericardial effusion. There are dense three-vessel coronary artery calcifications. Great vessels are normal in caliber. Mild aortic atherosclerosis. Mediastinum/Nodes: No neck base, axillary, mediastinal or hilar masses or enlarged lymph nodes. Trachea and esophagus are unremarkable. Lungs/Pleura: There is dependent opacity in the lower lobes and in the posterior inferior left upper lobe lingula, most likely atelectasis. 3 mm subpleural nodule, lateral right middle lobe, image 67, series 7, without significant change from the 2006 CT. No other nodules. There is no evidence of pulmonary edema. No pleural effusion or pneumothorax. Musculoskeletal: No fracture or acute finding. No osteoblastic or osteolytic lesions. Review of the MIP images confirms the above findings. CT ABDOMEN and PELVIS FINDINGS Hepatobiliary: Liver  demonstrates morphologic changes of cirrhosis, overall decreased size, with central volume loss and relative enlargement of the lateral segment of the left lobe and caudate lobe as well as nodularity. No discrete liver mass. Gallbladder is distended density suggested in the gallbladder neck which may reflect a small stone. There is no wall thickening. No pericholecystic fluid. No bile duct dilation. Pancreas: Unremarkable. No pancreatic ductal dilatation or surrounding inflammatory changes. Spleen: Spleen mildly enlarged measuring 14 x 5 x 11 cm. No splenic mass or focal lesion. Adrenals/Urinary Tract: No adrenal masses. Two small low-density masses, 6 mm, lower pole right and 5 mm, midpole left, consistent with small cysts or angiomyolipomas. No other renal masses. Nonobstructing stone midpole the left kidney. No hydronephrosis. Normal ureters. Normal bladder. Stomach/Bowel: Stomach is unremarkable. Small bowel is normal caliber. No wall thickening  or inflammation. There are multiple left colon diverticula mostly along the sigmoid. No diverticulitis. No colon wall thickening or other inflammatory process. Normal appendix visualized. Vascular/Lymphatic: Dense aortic atherosclerosis. No aneurysm. There are prominent to mildly enlarged lymph nodes along the gastrohepatic ligament. Reproductive: Unremarkable Other: No hernia or ascites. Musculoskeletal: No fracture or acute finding. No osteoblastic or osteolytic lesions. Degenerative changes noted throughout the visualized spine. Review of the MIP images confirms the above findings. IMPRESSION: CTA CHEST 1. No evidence of a pulmonary embolism. 2. Dependent opacity in the lower lobes most consistent with atelectasis. Consider a component of pneumonia if there are consistent clinical findings. No evidence of pulmonary edema. 3. Coronary artery calcifications and aortic atherosclerosis. CT ABDOMEN AND PELVIS 1. Gallbladder is distended and there is a possible stone in the  gallbladder neck, but there is no wall thickening or adjacent inflammation. If clinical findings strongly support acute cholecystitis, consider follow-up right upper quadrant ultrasound for further assessment. 2. Cirrhosis.  Mild splenomegaly. 3. Mild gastrohepatic ligament adenopathy, presumed reactive in the setting of cirrhosis. 4. Dense aortic atherosclerosis. 5. Left colon diverticula without diverticulitis. Electronically Signed   By: Amie Portland M.D.   On: 04/09/2018 14:22   Nm Hepatobiliary Liver Func  Result Date: 04/10/2018 CLINICAL DATA:  Inpatient.  Right upper quadrant abdominal pain. EXAM: NUCLEAR MEDICINE HEPATOBILIARY IMAGING TECHNIQUE: Sequential images of the abdomen were obtained out to 60 minutes following intravenous administration of radiopharmaceutical. RADIOPHARMACEUTICALS:  5.4 mCi Tc-105m  Choletec IV COMPARISON:  04/09/2018 abdominal sonogram and CT abdomen/pelvis. FINDINGS: Prompt uptake and biliary excretion of activity by the liver is seen. Gallbladder activity is visualized after administration of IV morphine, consistent with patency of cystic duct. Biliary activity passes into small bowel, consistent with patent common bile duct. IMPRESSION: 1. Gallbladder activity is visualized, indicative of patent cystic duct, which is not compatible with acute cholecystitis. 2. Patent common bile duct. Electronically Signed   By: Delbert Phenix M.D.   On: 04/10/2018 11:14   Ct Abdomen Pelvis W Contrast  Result Date: 04/09/2018 CLINICAL DATA:  Right sided upper quadrant to lower abdominal pain with nausea since this morning. EXAM: CT ANGIOGRAPHY CHEST CT ABDOMEN AND PELVIS WITH CONTRAST TECHNIQUE: Multidetector CT imaging of the chest was performed using the standard protocol during bolus administration of intravenous contrast. Multiplanar CT image reconstructions and MIPs were obtained to evaluate the vascular anatomy. Multidetector CT imaging of the abdomen and pelvis was performed using  the standard protocol during bolus administration of intravenous contrast. CONTRAST:  ISOVUE-370 IOPAMIDOL (ISOVUE-370) INJECTION 76% COMPARISON:  CT abdomen pelvis, 05/20/2004 FINDINGS: CTA CHEST FINDINGS Cardiovascular: There is satisfactory opacification of the pulmonary arteries to the segmental level. There is no evidence of a pulmonary embolism. Heart is top-normal in size. No pericardial effusion. There are dense three-vessel coronary artery calcifications. Great vessels are normal in caliber. Mild aortic atherosclerosis. Mediastinum/Nodes: No neck base, axillary, mediastinal or hilar masses or enlarged lymph nodes. Trachea and esophagus are unremarkable. Lungs/Pleura: There is dependent opacity in the lower lobes and in the posterior inferior left upper lobe lingula, most likely atelectasis. 3 mm subpleural nodule, lateral right middle lobe, image 67, series 7, without significant change from the 2006 CT. No other nodules. There is no evidence of pulmonary edema. No pleural effusion or pneumothorax. Musculoskeletal: No fracture or acute finding. No osteoblastic or osteolytic lesions. Review of the MIP images confirms the above findings. CT ABDOMEN and PELVIS FINDINGS Hepatobiliary: Liver demonstrates morphologic changes of cirrhosis, overall  decreased size, with central volume loss and relative enlargement of the lateral segment of the left lobe and caudate lobe as well as nodularity. No discrete liver mass. Gallbladder is distended density suggested in the gallbladder neck which may reflect a small stone. There is no wall thickening. No pericholecystic fluid. No bile duct dilation. Pancreas: Unremarkable. No pancreatic ductal dilatation or surrounding inflammatory changes. Spleen: Spleen mildly enlarged measuring 14 x 5 x 11 cm. No splenic mass or focal lesion. Adrenals/Urinary Tract: No adrenal masses. Two small low-density masses, 6 mm, lower pole right and 5 mm, midpole left, consistent with small  cysts or angiomyolipomas. No other renal masses. Nonobstructing stone midpole the left kidney. No hydronephrosis. Normal ureters. Normal bladder. Stomach/Bowel: Stomach is unremarkable. Small bowel is normal caliber. No wall thickening or inflammation. There are multiple left colon diverticula mostly along the sigmoid. No diverticulitis. No colon wall thickening or other inflammatory process. Normal appendix visualized. Vascular/Lymphatic: Dense aortic atherosclerosis. No aneurysm. There are prominent to mildly enlarged lymph nodes along the gastrohepatic ligament. Reproductive: Unremarkable Other: No hernia or ascites. Musculoskeletal: No fracture or acute finding. No osteoblastic or osteolytic lesions. Degenerative changes noted throughout the visualized spine. Review of the MIP images confirms the above findings. IMPRESSION: CTA CHEST 1. No evidence of a pulmonary embolism. 2. Dependent opacity in the lower lobes most consistent with atelectasis. Consider a component of pneumonia if there are consistent clinical findings. No evidence of pulmonary edema. 3. Coronary artery calcifications and aortic atherosclerosis. CT ABDOMEN AND PELVIS 1. Gallbladder is distended and there is a possible stone in the gallbladder neck, but there is no wall thickening or adjacent inflammation. If clinical findings strongly support acute cholecystitis, consider follow-up right upper quadrant ultrasound for further assessment. 2. Cirrhosis.  Mild splenomegaly. 3. Mild gastrohepatic ligament adenopathy, presumed reactive in the setting of cirrhosis. 4. Dense aortic atherosclerosis. 5. Left colon diverticula without diverticulitis. Electronically Signed   By: Amie Portland M.D.   On: 04/09/2018 14:22   US Abdomen Limited Ruq  Result Date: 04/09/2018 CLINICAL DATA:  Right upper quadrant pain. EXAM: ULTRASOUND ABDOMEN LIMITED RIGHT UPPER QUADRANT COMPARISON:  None. FINDINGS: Gallbladder: The gallbladder is distended without wall  thickening. The wall measures 2 mm. There may be a tiny amount of pericholecystic fluid. A positive Murphy's sign is reported. There is sludge in the gallbladder with no stones. Common bile duct: Diameter: 6.3 mm Liver: A nodular contour and heterogeneous echotexture of the liver is consistent with known cirrhosis. No focal mass. The umbilical vein is recannulized. The portal vein is patent but appears to contain bidirectional flow. IMPRESSION: 1. Findings associated with the gallbladder are mixed. Gallbladder distension, gallbladder sludge, a positive Murphy's sign, and the possibility of minimal pericholecystic fluid all raise the possibility of acute cholecystitis. However, there is no wall thickening or stones. A HIDA scan could further evaluate as clinically warranted. 2. Cirrhotic liver. 3. Recannulization of the umbilical vein and bidirectional flow in the portal vein are consistent with portal venous hypertension. 4. The common bile duct is borderline in caliber. Recommend correlation with labs. Electronically Signed   By: Gerome Sam III M.D   On: 04/09/2018 15:58    Patient Profile     73 y.o. 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 He presented to  [facility] with right upper abdominal pain with nausea/vomiting. Cardiology consulted yesterday for pre-op eval for possibly cholecystitis with gallbladder distention. However, HIDA scan was negative for acute cholecystitis and general surgery does not presently see a need for surgical intervention.  Assessment & Plan    1. Abdominal pain/nausea/vomiting - HIDA negative for acute cholecystitis. Per surgical app note, no role for surgical intervention. Further w/u per primary team.  2. Chronic combined CHF/ICM - BP has been on the soft side this admission but appears stable  from OP setting (I.e. 10/2017 BP 98/72). Continue present regimen.  3. Severe CAD - continues on ASA, BB, Imdur. Continue statin if OK with IM. No angina.  4. Anemia/thrombocytopenia - per primary team. Plt count was previously variable as well. CT suggests possible cirrhosis so will need further GI monitoring.  Pt is on recall for CHF f/u appt for 05/12/18. Cardiology will likely s/o.  For questions or updates, please contact [facility] Please consult [email or web address] for contact info under Cardiology/STEMI.  Signed, Laurann Montanaayna N Dunn, PA-C 04/11/2018, 8:42 AM    PT seen and examined   I agree with findings as noted by D DUnn above Pt comfortable    Lungs are CTA   Neck JVP is normal   Cardiac:   RRR   No S3  Ext are without edema Pt with severe CAD and  CHF   Volume status is OK   No symtpoms of angina    Keep on meds  Follow up arranged in clinic as noted above  Will sign off.  Dietrich PatesPaula Elin Seats

## 2018-04-11 NOTE — Progress Notes (Signed)
[facility] Progress Note     Subjective: CC-  No complaints. Denies abdominal pain. Tolerating clear liquids. No n/v. Last BM 2 days ago. Patient asking when he can go home. HIDA scan negative for acute cholecystitis.  Objective: Vital signs in last 24 hours: Temp:  [97.4 F (36.3 C)-98.4 F (36.9 C)] 97.7 F (36.5 C) (01/20 0438) Pulse Rate:  [54-71] 71 (01/20 0438) Resp:  [17-19] 19 (01/20 0438) BP: (93-110)/(65-78) 99/78 (01/20 0438) SpO2:  [95 %-97 %] 97 % (01/20 0438) Last BM Date: 04/09/18  Intake/Output from previous day: 01/19 0701 - 01/20 0700 In: 2238.1 [P.O.:840; I.V.:1201; IV Piggyback:197.1] Out: 925 [Urine:925] Intake/Output this shift: No intake/output data recorded.  PE: Gen:  Alert, NAD, pleasant HEENT: EOM's intact, pupils equal and round Card:  RRR Pulm:  CTAB, no W/R/R, effort normal Abd: Soft, NT/ND, +BS, no HSM, no hernia Skin: warm and dry  Lab Results:  Recent Labs    04/10/18 0234 04/11/18 0235  WBC 8.9 4.8  HGB 13.8 12.5*  HCT 41.3 38.2*  PLT PLATELET CLUMPS NOTED ON SMEAR, COUNT APPEARS DECREASED 90*   BMET Recent Labs    04/10/18 0234 04/11/18 0235  NA 136 137  K 5.0 4.3  CL 104 107  CO2 21* 23  GLUCOSE 115* 95  BUN 17 11  CREATININE 0.95 1.18  CALCIUM 8.8* 8.3*   PT/INR No results for input(s): LABPROT, INR in the last 72 hours. CMP     Component Value Date/Time   NA 137 04/11/2018 0235   NA 141 02/15/2017 1619   K 4.3 04/11/2018 0235   CL 107 04/11/2018 0235   CO2 23 04/11/2018 0235   GLUCOSE 95 04/11/2018 0235   BUN 11 04/11/2018 0235   BUN 17 02/15/2017 1619   CREATININE 1.18 04/11/2018 0235   CREATININE 0.74 02/09/2017 1456   CALCIUM 8.3 (L) 04/11/2018 0235   PROT 5.3 (L) 04/11/2018 0235   PROT 5.8 (L) 01/20/2017 1014   ALBUMIN 3.1 (L) 04/11/2018 0235   ALBUMIN 3.6 01/20/2017 1014   AST 40 04/11/2018 0235   ALT 31 04/11/2018 0235   ALKPHOS 59 04/11/2018 0235   BILITOT 0.9 04/11/2018  0235   BILITOT 1.1 01/20/2017 1014   GFRNONAA >60 04/11/2018 0235   GFRNONAA 88 07/25/2015 1155   GFRAA >60 04/11/2018 0235   GFRAA >89 07/25/2015 1155   Lipase     Component Value Date/Time   LIPASE 32 04/10/2018 0234       Studies/Results: Dg Chest 2 View  Result Date: 04/09/2018 CLINICAL DATA:  Right chest pain and shortness of breath. EXAM: CHEST - 2 VIEW COMPARISON:  March 25, 2017 FINDINGS: The heart size and mediastinal contours are stable. Cardiac pacemaker is unchanged. Mild opacity is identified in the left lung base at least in part due to atelectasis but developing pneumonia is not excluded. The right lung is clear. The visualized skeletal structures are stable. IMPRESSION: Mild opacity is identified in the left lung base at least in part due to atelectasis but developing pneumonia is not excluded. Electronically Signed   By: Sherian ReinWei-Chen  Lin M.D.   On: 04/09/2018 13:39   Ct Angio Chest Pe W/cm &/or Wo Cm  Result Date: 04/09/2018 CLINICAL DATA:  Right sided upper quadrant to lower abdominal pain with nausea since this morning. EXAM: CT ANGIOGRAPHY CHEST CT ABDOMEN AND PELVIS WITH CONTRAST TECHNIQUE: Multidetector CT imaging of the chest was performed using the standard protocol during bolus administration of intravenous contrast.  Multiplanar CT image reconstructions and MIPs were obtained to evaluate the vascular anatomy. Multidetector CT imaging of the abdomen and pelvis was performed using the standard protocol during bolus administration of intravenous contrast. CONTRAST:  ISOVUE-370 IOPAMIDOL (ISOVUE-370) INJECTION 76% COMPARISON:  CT abdomen pelvis, 05/20/2004 FINDINGS: CTA CHEST FINDINGS Cardiovascular: There is satisfactory opacification of the pulmonary arteries to the segmental level. There is no evidence of a pulmonary embolism. Heart is top-normal in size. No pericardial effusion. There are dense three-vessel coronary artery calcifications. Great vessels are normal  in caliber. Mild aortic atherosclerosis. Mediastinum/Nodes: No neck base, axillary, mediastinal or hilar masses or enlarged lymph nodes. Trachea and esophagus are unremarkable. Lungs/Pleura: There is dependent opacity in the lower lobes and in the posterior inferior left upper lobe lingula, most likely atelectasis. 3 mm subpleural nodule, lateral right middle lobe, image 67, series 7, without significant change from the 2006 CT. No other nodules. There is no evidence of pulmonary edema. No pleural effusion or pneumothorax. Musculoskeletal: No fracture or acute finding. No osteoblastic or osteolytic lesions. Review of the MIP images confirms the above findings. CT ABDOMEN and PELVIS FINDINGS Hepatobiliary: Liver demonstrates morphologic changes of cirrhosis, overall decreased size, with central volume loss and relative enlargement of the lateral segment of the left lobe and caudate lobe as well as nodularity. No discrete liver mass. Gallbladder is distended density suggested in the gallbladder neck which may reflect a small stone. There is no wall thickening. No pericholecystic fluid. No bile duct dilation. Pancreas: Unremarkable. No pancreatic ductal dilatation or surrounding inflammatory changes. Spleen: Spleen mildly enlarged measuring 14 x 5 x 11 cm. No splenic mass or focal lesion. Adrenals/Urinary Tract: No adrenal masses. Two small low-density masses, 6 mm, lower pole right and 5 mm, midpole left, consistent with small cysts or angiomyolipomas. No other renal masses. Nonobstructing stone midpole the left kidney. No hydronephrosis. Normal ureters. Normal bladder. Stomach/Bowel: Stomach is unremarkable. Small bowel is normal caliber. No wall thickening or inflammation. There are multiple left colon diverticula mostly along the sigmoid. No diverticulitis. No colon wall thickening or other inflammatory process. Normal appendix visualized. Vascular/Lymphatic: Dense aortic atherosclerosis. No aneurysm. There are  prominent to mildly enlarged lymph nodes along the gastrohepatic ligament. Reproductive: Unremarkable Other: No hernia or ascites. Musculoskeletal: No fracture or acute finding. No osteoblastic or osteolytic lesions. Degenerative changes noted throughout the visualized spine. Review of the MIP images confirms the above findings. IMPRESSION: CTA CHEST 1. No evidence of a pulmonary embolism. 2. Dependent opacity in the lower lobes most consistent with atelectasis. Consider a component of pneumonia if there are consistent clinical findings. No evidence of pulmonary edema. 3. Coronary artery calcifications and aortic atherosclerosis. CT ABDOMEN AND PELVIS 1. Gallbladder is distended and there is a possible stone in the gallbladder neck, but there is no wall thickening or adjacent inflammation. If clinical findings strongly support acute cholecystitis, consider follow-up right upper quadrant ultrasound for further assessment. 2. Cirrhosis.  Mild splenomegaly. 3. Mild gastrohepatic ligament adenopathy, presumed reactive in the setting of cirrhosis. 4. Dense aortic atherosclerosis. 5. Left colon diverticula without diverticulitis. Electronically Signed   By: Amie Portland M.D.   On: 04/09/2018 14:22   Nm Hepatobiliary Liver Func  Result Date: 04/10/2018 CLINICAL DATA:  Inpatient.  Right upper quadrant abdominal pain. EXAM: NUCLEAR MEDICINE HEPATOBILIARY IMAGING TECHNIQUE: Sequential images of the abdomen were obtained out to 60 minutes following intravenous administration of radiopharmaceutical. RADIOPHARMACEUTICALS:  5.4 mCi Tc-51m  Choletec IV COMPARISON:  04/09/2018 abdominal sonogram and  CT abdomen/pelvis. FINDINGS: Prompt uptake and biliary excretion of activity by the liver is seen. Gallbladder activity is visualized after administration of IV morphine, consistent with patency of cystic duct. Biliary activity passes into small bowel, consistent with patent common bile duct. IMPRESSION: 1. Gallbladder activity is  visualized, indicative of patent cystic duct, which is not compatible with acute cholecystitis. 2. Patent common bile duct. Electronically Signed   By: Delbert Phenix M.D.   On: 04/10/2018 11:14   Ct Abdomen Pelvis W Contrast  Result Date: 04/09/2018 CLINICAL DATA:  Right sided upper quadrant to lower abdominal pain with nausea since this morning. EXAM: CT ANGIOGRAPHY CHEST CT ABDOMEN AND PELVIS WITH CONTRAST TECHNIQUE: Multidetector CT imaging of the chest was performed using the standard protocol during bolus administration of intravenous contrast. Multiplanar CT image reconstructions and MIPs were obtained to evaluate the vascular anatomy. Multidetector CT imaging of the abdomen and pelvis was performed using the standard protocol during bolus administration of intravenous contrast. CONTRAST:  ISOVUE-370 IOPAMIDOL (ISOVUE-370) INJECTION 76% COMPARISON:  CT abdomen pelvis, 05/20/2004 FINDINGS: CTA CHEST FINDINGS Cardiovascular: There is satisfactory opacification of the pulmonary arteries to the segmental level. There is no evidence of a pulmonary embolism. Heart is top-normal in size. No pericardial effusion. There are dense three-vessel coronary artery calcifications. Great vessels are normal in caliber. Mild aortic atherosclerosis. Mediastinum/Nodes: No neck base, axillary, mediastinal or hilar masses or enlarged lymph nodes. Trachea and esophagus are unremarkable. Lungs/Pleura: There is dependent opacity in the lower lobes and in the posterior inferior left upper lobe lingula, most likely atelectasis. 3 mm subpleural nodule, lateral right middle lobe, image 67, series 7, without significant change from the 2006 CT. No other nodules. There is no evidence of pulmonary edema. No pleural effusion or pneumothorax. Musculoskeletal: No fracture or acute finding. No osteoblastic or osteolytic lesions. Review of the MIP images confirms the above findings. CT ABDOMEN and PELVIS FINDINGS Hepatobiliary: Liver  demonstrates morphologic changes of cirrhosis, overall decreased size, with central volume loss and relative enlargement of the lateral segment of the left lobe and caudate lobe as well as nodularity. No discrete liver mass. Gallbladder is distended density suggested in the gallbladder neck which may reflect a small stone. There is no wall thickening. No pericholecystic fluid. No bile duct dilation. Pancreas: Unremarkable. No pancreatic ductal dilatation or surrounding inflammatory changes. Spleen: Spleen mildly enlarged measuring 14 x 5 x 11 cm. No splenic mass or focal lesion. Adrenals/Urinary Tract: No adrenal masses. Two small low-density masses, 6 mm, lower pole right and 5 mm, midpole left, consistent with small cysts or angiomyolipomas. No other renal masses. Nonobstructing stone midpole the left kidney. No hydronephrosis. Normal ureters. Normal bladder. Stomach/Bowel: Stomach is unremarkable. Small bowel is normal caliber. No wall thickening or inflammation. There are multiple left colon diverticula mostly along the sigmoid. No diverticulitis. No colon wall thickening or other inflammatory process. Normal appendix visualized. Vascular/Lymphatic: Dense aortic atherosclerosis. No aneurysm. There are prominent to mildly enlarged lymph nodes along the gastrohepatic ligament. Reproductive: Unremarkable Other: No hernia or ascites. Musculoskeletal: No fracture or acute finding. No osteoblastic or osteolytic lesions. Degenerative changes noted throughout the visualized spine. Review of the MIP images confirms the above findings. IMPRESSION: CTA CHEST 1. No evidence of a pulmonary embolism. 2. Dependent opacity in the lower lobes most consistent with atelectasis. Consider a component of pneumonia if there are consistent clinical findings. No evidence of pulmonary edema. 3. Coronary artery calcifications and aortic atherosclerosis. CT ABDOMEN AND PELVIS 1.  Gallbladder is distended and there is a possible stone in the  gallbladder neck, but there is no wall thickening or adjacent inflammation. If clinical findings strongly support acute cholecystitis, consider follow-up right upper quadrant ultrasound for further assessment. 2. Cirrhosis.  Mild splenomegaly. 3. Mild gastrohepatic ligament adenopathy, presumed reactive in the setting of cirrhosis. 4. Dense aortic atherosclerosis. 5. Left colon diverticula without diverticulitis. Electronically Signed   By: Amie Portland M.D.   On: 04/09/2018 14:22   US Abdomen Limited Ruq  Result Date: 04/09/2018 CLINICAL DATA:  Right upper quadrant pain. EXAM: ULTRASOUND ABDOMEN LIMITED RIGHT UPPER QUADRANT COMPARISON:  None. FINDINGS: Gallbladder: The gallbladder is distended without wall thickening. The wall measures 2 mm. There may be a tiny amount of pericholecystic fluid. A positive Murphy's sign is reported. There is sludge in the gallbladder with no stones. Common bile duct: Diameter: 6.3 mm Liver: A nodular contour and heterogeneous echotexture of the liver is consistent with known cirrhosis. No focal mass. The umbilical vein is recannulized. The portal vein is patent but appears to contain bidirectional flow. IMPRESSION: 1. Findings associated with the gallbladder are mixed. Gallbladder distension, gallbladder sludge, a positive Murphy's sign, and the possibility of minimal pericholecystic fluid all raise the possibility of acute cholecystitis. However, there is no wall thickening or stones. A HIDA scan could further evaluate as clinically warranted. 2. Cirrhotic liver. 3. Recannulization of the umbilical vein and bidirectional flow in the portal vein are consistent with portal venous hypertension. 4. The common bile duct is borderline in caliber. Recommend correlation with labs. 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 Recommend advancing diet as tolerated. Patient does not need antibiotics. No role for surgical intervention. General surgery will sign off, please call with concerns.  ID - zosyn 1/18>> FEN - IVF, CLD VTE - sq heparin Foley - none   LOS: 2 days    Franne Forts , [facility] 04/11/2018, 8:35 AM Pager: [phone] Mon-Thurs 7:00 am-4:30 pm Fri 7:00 am -11:30 AM Sat-Sun 7:00 am-11:30 am

## 2018-04-15 ENCOUNTER — Ambulatory Visit (INDEPENDENT_AMBULATORY_CARE_PROVIDER_SITE_OTHER): Payer: BC Managed Care – PPO | Admitting: Family Medicine

## 2018-04-15 ENCOUNTER — Encounter: Payer: Self-pay | Admitting: Family Medicine

## 2018-04-15 ENCOUNTER — Other Ambulatory Visit: Payer: Self-pay

## 2018-04-15 VITALS — BP 128/76 | HR 54 | Temp 98.3°F | Resp 14 | Ht 70.0 in | Wt 177.0 lb

## 2018-04-15 DIAGNOSIS — N62 Hypertrophy of breast: Secondary | ICD-10-CM | POA: Diagnosis not present

## 2018-04-15 DIAGNOSIS — B182 Chronic viral hepatitis C: Secondary | ICD-10-CM | POA: Diagnosis not present

## 2018-04-15 DIAGNOSIS — K7460 Unspecified cirrhosis of liver: Secondary | ICD-10-CM

## 2018-04-15 DIAGNOSIS — E039 Hypothyroidism, unspecified: Secondary | ICD-10-CM

## 2018-04-15 MED ORDER — VARENICLINE TARTRATE 0.5 MG X 11 & 1 MG X 42 PO MISC: ORAL | 0 refills | Status: AC

## 2018-04-15 NOTE — Addendum Note (Signed)
Addended by: Milinda Antis F on: 04/15/2018 05:12 PM   Modules accepted: Orders

## 2018-04-15 NOTE — Assessment & Plan Note (Signed)
Continue synthroid, recheck TSH

## 2018-04-15 NOTE — Assessment & Plan Note (Signed)
SE of aldactone no nodules palpated on exam due to need for CHF would not stop medication

## 2018-04-15 NOTE — Progress Notes (Addendum)
Subjective:    Patient ID: Tommy Robbins, male    DOB: 03/30/1945, 73 y.o.   MRN: [id number]  Patient presents for Hospital F/U (gallbladder )  Pt here for hospital follow up for abdominal pain, there was concern for gallstones and cholecystis he was placed on antibiotics.  He had a lot of imaging done including CT scan and HIDA scan abdominal ultrasound.  After the second day his abdominal pain resolved.  The HIDA scan did not show any abnormality so decision was made to let him eat.  He was deemed too high risk for surgical intervention by cardiology at any rate.  He does not have any abdominal pain currently no nausea vomiting he is back to eating normally.    Cirrhosis -on his CT scan to work-up his abdominal pain is found that he had cirrhosis of the liver also with mild splenomegaly.  He does have known hepatitis C which was partially treated years ago he was referred to infectious disease in January 2019 however due to the side effects of the medications and because he did not want to start treatment at that time and has not followed up since then.  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       CHF- now on entrestro from cardiology, no chest pain or SOB    Chronic pain- still followed by pain clinic    Review Of Systems:  GEN- denies fatigue, fever, weight loss,weakness, recent illness HEENT- denies eye drainage, change in vision, nasal discharge, CVS- denies chest pain, palpitations RESP- denies SOB, cough, wheeze ABD- denies N/V, change in stools, abd pain GU- denies dysuria, hematuria, dribbling, incontinence MSK- + joint pain, muscle aches, injury Neuro- denies headache, dizziness, syncope, seizure activity       Objective:    BP 128/76   Pulse (!) 54   Temp 98.3 F (36.8 C) (Oral)   Resp 14   Ht 5\' 10"  (1.778 m)   Wt 177 lb (80.3 kg)   SpO2 95%   BMI 25.40 kg/m  GEN- NAD, alert and oriented x3 HEENT- PERRL, EOMI, non injected sclera, pink  conjunctiva, MMM, oropharynx clear Neck- Supple, no thyromegaly Breast- normal symmetry, increased breast tissue for male , no nipple inversion,no nipple drainage, no nodules or lumps felt Nodes- no axillary nodes CVS- RRR, no murmur RESP-CTAB ABD-NABS,soft,NT,ND EXT- No edema Pulses- Radial, DP- 2+        Assessment & Plan:    Pt asked for chantix at end of visit to try again   Problem List Items Addressed This Visit      Unprioritized   Cirrhosis of liver (HCC)   Relevant Orders   AFP tumor marker   Ambulatory referral to Gastroenterology   Gynecomastia    SE of aldactone no nodules palpated on exam due to need for CHF would not stop medication      Hepatitis C - Primary    Untreated due to tolerance for side effects and interaction with his cardiac medications early in the year.  He now has cirrhosis of the liver which was found on CT scan which I am referring him to gastroenterology so that he can have a discussion with him about his potential life expectancy and whether or not he should pursue any treatments for the cirrhosis or the hepatitis C.  He is very high risk for any type of surgical intervention.      Relevant Orders   Comprehensive metabolic  panel   CBC with Differential/Platelet   AFP tumor marker   Ambulatory referral to Gastroenterology   Hepatitis C RNA quantitative   Hypothyroidism    Continue synthroid, recheck TSH      Relevant Orders   TSH      Note: This dictation was prepared with Dragon dictation along with smaller phrase technology. Any transcriptional errors that result from this process are unintentional.

## 2018-04-15 NOTE — Assessment & Plan Note (Signed)
Untreated due to tolerance for side effects and interaction with his cardiac medications early in the year.  He now has cirrhosis of the liver which was found on CT scan which I am referring him to gastroenterology so that he can have a discussion with him about his potential life expectancy and whether or not he should pursue any treatments for the cirrhosis or the hepatitis C.  He is very high risk for any type of surgical intervention.

## 2018-04-15 NOTE — Patient Instructions (Addendum)
F/U 4 months  

## 2018-04-16 LAB — TSH: TSH: 2.33 mIU/L (ref 0.40–4.50)

## 2018-04-19 LAB — CBC WITH DIFFERENTIAL/PLATELET
Absolute Monocytes: 529 cells/uL (ref 200–950)
Basophils Absolute: 21 cells/uL (ref 0–200)
Basophils Relative: 0.5 %
EOS PCT: 3.9 %
Eosinophils Absolute: 160 cells/uL (ref 15–500)
HCT: 39.9 % (ref 38.5–50.0)
Hemoglobin: 13.4 g/dL (ref 13.2–17.1)
Lymphs Abs: 939 cells/uL (ref 850–3900)
MCH: 30.5 pg (ref 27.0–33.0)
MCHC: 33.6 g/dL (ref 32.0–36.0)
MCV: 90.7 fL (ref 80.0–100.0)
MPV: 11.3 fL (ref 7.5–12.5)
Monocytes Relative: 12.9 %
Neutro Abs: 2452 cells/uL (ref 1500–7800)
Neutrophils Relative %: 59.8 %
Platelets: 119 10*3/uL — ABNORMAL LOW (ref 140–400)
RBC: 4.4 10*6/uL (ref 4.20–5.80)
RDW: 12.4 % (ref 11.0–15.0)
Total Lymphocyte: 22.9 %
WBC: 4.1 10*3/uL (ref 3.8–10.8)

## 2018-04-19 LAB — COMPREHENSIVE METABOLIC PANEL
AG Ratio: 1.6 (calc) (ref 1.0–2.5)
ALT: 59 U/L — ABNORMAL HIGH (ref 9–46)
AST: 65 U/L — ABNORMAL HIGH (ref 10–35)
Albumin: 3.7 g/dL (ref 3.6–5.1)
Alkaline phosphatase (APISO): 112 U/L (ref 40–115)
BUN: 16 mg/dL (ref 7–25)
CO2: 24 mmol/L (ref 20–32)
Calcium: 8.9 mg/dL (ref 8.6–10.3)
Chloride: 109 mmol/L (ref 98–110)
Creat: 0.85 mg/dL (ref 0.70–1.18)
GLUCOSE: 90 mg/dL (ref 65–99)
Globulin: 2.3 g/dL (calc) (ref 1.9–3.7)
Potassium: 4.2 mmol/L (ref 3.5–5.3)
Sodium: 143 mmol/L (ref 135–146)
Total Bilirubin: 0.5 mg/dL (ref 0.2–1.2)
Total Protein: 6 g/dL — ABNORMAL LOW (ref 6.1–8.1)

## 2018-04-19 LAB — AFP TUMOR MARKER: AFP-Tumor Marker: 16.7 ng/mL — ABNORMAL HIGH (ref <6.1)

## 2018-04-19 LAB — HEPATITIS C RNA QUANTITATIVE
HCV Quantitative Log: 5.69 Log IU/mL — ABNORMAL HIGH
HCV RNA, PCR, QN: 493000 [IU]/mL — AB

## 2018-04-21 ENCOUNTER — Telehealth: Payer: Self-pay

## 2018-04-21 ENCOUNTER — Ambulatory Visit (INDEPENDENT_AMBULATORY_CARE_PROVIDER_SITE_OTHER): Payer: BC Managed Care – PPO

## 2018-04-21 DIAGNOSIS — I2102 ST elevation (STEMI) myocardial infarction involving left anterior descending coronary artery: Secondary | ICD-10-CM | POA: Diagnosis not present

## 2018-04-21 DIAGNOSIS — Z95810 Presence of automatic (implantable) cardiac defibrillator: Secondary | ICD-10-CM

## 2018-04-21 DIAGNOSIS — I5022 Chronic systolic (congestive) heart failure: Secondary | ICD-10-CM

## 2018-04-21 NOTE — Progress Notes (Signed)
EPIC Encounter for ICM Monitoring  Patient Name: Tommy Robbins is a 73 y.o. male Date: 04/21/2018 Primary Care Physican: Alycia Rossetti, MD Primary Cardiologist:Berry/Bensimhon Electrophysiologist:Allred Last Weight:170 lb Today's Weight:unknown   Attempted call to patient and unable to reach. Transmission reviewed.   Thoracic impedanceabnormal suggesting fluid accumulation since 04/09/2018 but trending back toward baseline.   Prescribed:Furosemide20 mgtake1 tabletby mouth daily as needed for fluid.  Labs 04/15/2018 Creatinine 0.85, BUN 16, Potassium 4.2, Sodium 143 04/11/2018 Creatinine 1.18, BUN 11, Potassium 4.3, Sodium 137, eGFR >60  04/10/2018 Creatinine 0.95, BUN 17, Potassium 5.0, Sodium 136, eGFR >60  04/09/2018 Creatinine 1.14, BUN 19, Potassium 4.1, Sodium 136, eGFR >60  11/18/2017 Creatinine1.05, BUN18, Potassium4.6, Sodium139, eGFR>60 11/10/2017 Creatinine1.16, BUN46, Potassium5.5, Sodium135, eGFR>60  03/24/2017 Creatinine0.82, BUN12, Potassium4.8, Sodium136, eGFR>60 A complete set of results can be found in Results Review.  Recommendations:Unable to reach.  Follow-up plan: ICM clinic phone appointment on2/12/2018 to recheck fluid levels.  Copy of ICM check sent to Dr.Allred  3 month ICM trend: 04/21/2018    1 Year ICM trend:       Rosalene Billings, RN 04/21/2018 11:58 AM

## 2018-04-21 NOTE — Telephone Encounter (Signed)
Remote ICM transmission received.  Attempted call to patient regarding ICM remote transmission and person answering phone stated he was not home.    

## 2018-04-28 ENCOUNTER — Encounter (INDEPENDENT_AMBULATORY_CARE_PROVIDER_SITE_OTHER): Payer: Self-pay | Admitting: Internal Medicine

## 2018-04-28 ENCOUNTER — Ambulatory Visit (INDEPENDENT_AMBULATORY_CARE_PROVIDER_SITE_OTHER): Payer: BC Managed Care – PPO | Admitting: Internal Medicine

## 2018-04-28 VITALS — BP 100/50 | HR 56 | Temp 98.7°F | Ht 70.0 in | Wt 171.0 lb

## 2018-04-28 DIAGNOSIS — B1710 Acute hepatitis C without hepatic coma: Secondary | ICD-10-CM | POA: Diagnosis not present

## 2018-04-28 NOTE — Patient Instructions (Signed)
Labs today    Further recommendations to follow

## 2018-04-28 NOTE — Progress Notes (Signed)
Subjective:    Patient ID: Tommy Robbins, male    DOB: 1945-12-02, 73 y.o.   MRN: [id number]  HPI  Referred by Dr. Jeanice Lim for Hep C/ Cirrhosis. States he was diagnosed in 1998. States he was treated with Interferon and Ribavirin in 1998. In 2005, he was started on Inferon and Ribavirin in [place]. States he did IV drugs about 55 yrs ago. Has had a blood transfusion in 1981.  He is not doing drugs now. Has not done drugs in over 40 yrs.  Appetite is good. No weight loss.  BM x 1 a day.    04/15/2018 He C quaint positive.  04/09/2018 [place] RUQ: Cirrhotic liver. Recannulization of the umbilical vein and bidirectional flow in the portal vein are consistent with portal venous hypertension. 4. The common bile duct is borderline in caliber. Recommend correlation with labs.  MI one year ago.   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  CBC    Component Value Date/Time   WBC 4.1 04/15/2018 1521   RBC 4.40 04/15/2018 1521   HGB 13.4 04/15/2018 1521   HGB 12.9 (L) 02/15/2017 1619   HCT 39.9 04/15/2018 1521   HCT 38.7 02/15/2017 1619   PLT 119 (L) 04/15/2018 1521   PLT 136 (L) 02/15/2017 1619   MCV 90.7 04/15/2018 1521   MCV 90 02/15/2017 1619   MCH 30.5 04/15/2018 1521   MCHC 33.6 04/15/2018 1521   RDW 12.4 04/15/2018 1521   RDW 13.6 02/15/2017 1619   LYMPHSABS 939 04/15/2018 1521   LYMPHSABS 0.9 02/15/2017 1619   MONOABS 0.5 04/09/2018 1240   EOSABS 160 04/15/2018 1521   EOSABS 0.2 02/15/2017 1619   BASOSABS 21 04/15/2018 1521   BASOSABS 0.0 02/15/2017 1619     Review of Systems Past Medical History:  Diagnosis Date  . AICD (automatic cardioverter/defibrillator) present 03/24/2017  . Cataract   . Cellulitis and abscess of right leg 10/06/2016   right thigh  . Chronic low back pain   . Colon polyps   . Depression   . Diverticulosis   . GERD (gastroesophageal reflux disease)   . Hepatitis C   . History of sudden cardiac arrest 10/25/2016   witness/notes 02/10/2017  . Hx  of substance abuse (HCC)    [name] 02/10/2017  . Hypertension   . Insomnia   . Thyroid disease    hypothyroid    Past Surgical History:  Procedure Laterality Date  . CARDIAC CATHETERIZATION    . CARDIAC DEFIBRILLATOR PLACEMENT  03/24/2017  . IABP INSERTION N/A 10/24/2016   Procedure: IABP Insertion;  Surgeon: Runell Gess, MD;  Location: [facility] INVASIVE CV LAB;  Service: Cardiovascular;  Laterality: N/A;  . ICD IMPLANT N/A 03/24/2017   MDT [place] MRI XT DR ICD implanted by Dr Johney Frame for secondary prevention (VF arrest)  . JOINT REPLACEMENT    . LEFT HEART CATH AND CORONARY ANGIOGRAPHY N/A 10/24/2016   Procedure: LEFT HEART CATH AND CORONARY ANGIOGRAPHY;  Surgeon: Runell Gess, MD;  Location: [facility] INVASIVE CV LAB;  Service: Cardiovascular;  Laterality: N/A;  . TOTAL SHOULDER REPLACEMENT Right 03/23/2008    Allergies  Allergen Reactions  . Other Other (See Comments)    Seasonal allergies - sniffling     Current Outpatient Medications on File Prior to Visit  Medication Sig Dispense Refill  . aspirin EC 81 MG tablet Take 81 mg by mouth daily.    . cycloSPORINE (RESTASIS) 0.05 % ophthalmic emulsion Place 1 drop into both eyes  2 (two) times daily.    . diclofenac sodium (VOLTAREN) 1 % GEL Apply 1 application topically daily as needed (for pain).    Marland Kitchen ENTRESTO 24-26 MG TAKE 1 TABLET BY MOUTH TWICE A DAY (Patient taking differently: Take 1 tablet by mouth 2 (two) times daily. ) 60 tablet 3  . furosemide (LASIX) 20 MG tablet Take 1 tablet (20 mg total) by mouth daily as needed. (Patient taking differently: Take 20 mg by mouth daily as needed for fluid or edema. ) 90 tablet 3  . gabapentin (NEURONTIN) 100 MG capsule Take 200 mg by mouth at bedtime.   2  . isosorbide mononitrate (IMDUR) 30 MG 24 hr tablet TAKE 1 TABLET BY MOUTH EVERY DAY (Patient taking differently: Take 30 mg by mouth daily. ) 90 tablet 3  . lidocaine (LIDODERM) 5 % Place 3 patches onto the skin daily as needed (pain).   4    . metoprolol succinate (TOPROL-XL) 50 MG 24 hr tablet Take one tablet daily with food (Patient taking differently: Take 50 mg by mouth daily. Take one tablet daily with food) 90 tablet 3  . morphine (MS CONTIN) 15 MG 12 hr tablet Take 15 mg by mouth See admin instructions. Take 15 mg by mouth in the morning with 30 mg to equal 45 mg dose  0  . morphine (MS CONTIN) 30 MG 12 hr tablet Take 30 mg by mouth See admin instructions. Takes 30 mg by mouth in the morning with 15 mg to equal 45 mg dose, 30 mg in the afternoon, and 30 mg at night    . nitroGLYCERIN (NITROSTAT) 0.4 MG SL tablet Place 1 tablet (0.4 mg total) under the tongue every 5 (five) minutes as needed for chest pain. 25 tablet 3  . Nutritional Supplements (JUICE PLUS FIBRE PO) Take 2 capsules by mouth 2 (two) times daily.    . pantoprazole (PROTONIX) 40 MG tablet TAKE 1 TABLET DAILY (Patient taking differently: Take 40 mg by mouth every other day. ) 90 tablet 2  . spironolactone (ALDACTONE) 25 MG tablet Take 0.5 tablets (12.5 mg total) by mouth daily. 45 tablet 2  . SYNTHROID 88 MCG tablet TAKE 1 TABLET DAILY BEFORE BREAKFAST (Patient taking differently: Take 88 mcg by mouth daily before breakfast. ) 90 tablet 2  . temazepam (RESTORIL) 15 MG capsule Take 1 capsule (15 mg total) by mouth at bedtime as needed for sleep. (Patient taking differently: Take 15 mg by mouth every other day. ) 45 capsule 1  . traZODone (DESYREL) 50 MG tablet Take 25 mg by mouth at bedtime.   2  . varenicline (CHANTIX STARTING MONTH PAK) 0.5 MG X 11 & 1 MG X 42 tablet Take one 0.5 mg tablet by mouth once daily for 3 days, then increase to one 0.5 mg tablet twice daily for 4 days, then increase to one 1 mg tablet twice daily. 53 tablet 0  . atorvastatin (LIPITOR) 80 MG tablet TAKE 1 TABLET DAILY AT 6PM (Patient taking differently: Take 80 mg by mouth every evening. ) 90 tablet 2   No current facility-administered medications on file prior to visit.          Objective:   Physical Exam Blood pressure (!) 100/50, pulse (!) 56, temperature 98.7 F (37.1 C), height 5\' 10"  (1.778 m), weight 171 lb (77.6 kg). Alert and oriented. Skin warm and dry. Oral mucosa is moist.   . Sclera anicteric, conjunctivae is pink. Thyroid not enlarged. No cervical lymphadenopathy.  Lungs clear. Heart regular rate and rhythm.  Abdomen is soft. Bowel sounds are positive. No hepatomegaly. No abdominal masses felt. No tenderness.  No edema to lower extremities.         Assessment & Plan:  Hepatitis C. Has been treated x 2 with treatment failure.  Will get labs: Acute Hepatitis Panel, HIV, Urine drug screen, PT/INR. Hep C genotype.  Cirrhosis probably related to his Hep C.

## 2018-05-02 ENCOUNTER — Ambulatory Visit (INDEPENDENT_AMBULATORY_CARE_PROVIDER_SITE_OTHER): Payer: BC Managed Care – PPO

## 2018-05-02 DIAGNOSIS — I5022 Chronic systolic (congestive) heart failure: Secondary | ICD-10-CM

## 2018-05-02 DIAGNOSIS — Z95810 Presence of automatic (implantable) cardiac defibrillator: Secondary | ICD-10-CM

## 2018-05-02 NOTE — Progress Notes (Signed)
EPIC Encounter for ICM Monitoring  Patient Name: Tommy Robbins is a 73 y.o. male Date: 05/02/2018 Primary Care Physican: Alycia Rossetti, MD Primary Cardiologist:Berry/Bensimhon Electrophysiologist:Allred Last Weight:170lb Today's Weight:unknown   Heart failure questions reviewed.  Patient was hospitalized 1/19 due to Gallbladder which correlates with decreased impedance.  He is feeling fine now and denied having any fluid symptoms.  Thoracic impedancereturned to normal since last remote transmission 04/21/2018.   Prescribed:Furosemide20 mgtake1 tabletby mouth daily as needed for fluid.  Labs 04/15/2018 Creatinine 0.85, BUN 16, Potassium 4.2, Sodium 143 04/11/2018 Creatinine 1.18, BUN 11, Potassium 4.3, Sodium 137, eGFR >60  04/10/2018 Creatinine 0.95, BUN 17, Potassium 5.0, Sodium 136, eGFR >60  04/09/2018 Creatinine 1.14, BUN 19, Potassium 4.1, Sodium 136, eGFR >60  11/18/2017 Creatinine1.05, BUN18, Potassium4.6, Sodium139, eGFR>60 11/10/2017 Creatinine1.16, BUN46, Potassium5.5, Sodium135, eGFR>60  03/24/2017 Creatinine0.82, BUN12, Potassium4.8, Sodium136, eGFR>60 A complete set of results can be found in Results Review.  Recommendations:No changes and encouraged to call for fluid symptoms.   Follow-up plan: ICM clinic phone appointment on3/11/2018.  Copy of ICM check sent to Dr.Allred  3 month ICM trend: 05/02/2018    1 Year ICM trend:       Rosalene Billings, RN 05/02/2018 1:01 PM

## 2018-05-03 LAB — HEPATITIS PANEL, ACUTE
Hep A IgM: NONREACTIVE
Hep B C IgM: NONREACTIVE
Hepatitis B Surface Ag: NONREACTIVE
Hepatitis C Ab: REACTIVE — AB
SIGNAL TO CUT-OFF: 11 — AB (ref <1.00)

## 2018-05-03 LAB — DRUG ABUSE PANEL 10-50 NO CONF, U
AMPHETAMINES (1000 ng/mL SCRN): NEGATIVE
BARBITURATES: NEGATIVE
BENZODIAZEPINES: NEGATIVE
COCAINE METABOLITES: NEGATIVE
MARIJUANA MET (50 ng/mL SCRN): POSITIVE — AB
METHADONE: NEGATIVE
METHAQUALONE: NEGATIVE
OPIATES: POSITIVE — AB
PHENCYCLIDINE: NEGATIVE
PROPOXYPHENE: NEGATIVE

## 2018-05-03 LAB — HCV RNA,QUANTITATIVE REAL TIME PCR
HCV Quantitative Log: 5.68 Log IU/mL — ABNORMAL HIGH
HCV RNA, PCR, QN: 483000 IU/mL — ABNORMAL HIGH

## 2018-05-03 LAB — HIV ANTIBODY (ROUTINE TESTING W REFLEX): HIV: NONREACTIVE

## 2018-05-03 LAB — PROTIME-INR
INR: 1.2 — ABNORMAL HIGH
Prothrombin Time: 11.9 s — ABNORMAL HIGH (ref 9.0–11.5)

## 2018-05-03 LAB — HEPATITIS C GENOTYPE

## 2018-05-05 ENCOUNTER — Telehealth (INDEPENDENT_AMBULATORY_CARE_PROVIDER_SITE_OTHER): Payer: Self-pay | Admitting: Internal Medicine

## 2018-05-05 NOTE — Telephone Encounter (Signed)
Tammy, Mavyret. X 12 weeks.

## 2018-05-06 NOTE — Telephone Encounter (Signed)
This will be worked on the week of February 17 th,2020.

## 2018-05-11 ENCOUNTER — Telehealth (INDEPENDENT_AMBULATORY_CARE_PROVIDER_SITE_OTHER): Payer: Self-pay | Admitting: Internal Medicine

## 2018-05-11 NOTE — Telephone Encounter (Signed)
A new PA request has been sent to [facility].

## 2018-05-11 NOTE — Telephone Encounter (Signed)
Tommy Robbins, Epclusa x 12  Weeks.

## 2018-05-12 ENCOUNTER — Telehealth (INDEPENDENT_AMBULATORY_CARE_PROVIDER_SITE_OTHER): Payer: Self-pay | Admitting: *Deleted

## 2018-05-12 NOTE — Telephone Encounter (Signed)
Patient pharmacy was called and a verbal RX was given to pharmacist , Huntley Dec for the [place]. They will reach back out to [place] for the delivery to be made to our office.

## 2018-05-15 ENCOUNTER — Other Ambulatory Visit: Payer: Self-pay | Admitting: Family Medicine

## 2018-05-16 ENCOUNTER — Telehealth (INDEPENDENT_AMBULATORY_CARE_PROVIDER_SITE_OTHER): Payer: Self-pay | Admitting: Internal Medicine

## 2018-05-16 NOTE — Telephone Encounter (Signed)
I have spoken with patient 

## 2018-05-16 NOTE — Telephone Encounter (Signed)
Patient left message regarding his lab work - please call him at [phone]

## 2018-05-24 ENCOUNTER — Other Ambulatory Visit: Payer: Self-pay

## 2018-05-24 ENCOUNTER — Encounter: Payer: Self-pay | Admitting: Family Medicine

## 2018-05-24 ENCOUNTER — Ambulatory Visit (INDEPENDENT_AMBULATORY_CARE_PROVIDER_SITE_OTHER): Payer: BC Managed Care – PPO | Admitting: Family Medicine

## 2018-05-24 VITALS — BP 134/68 | HR 80 | Temp 97.9°F | Resp 16 | Ht 70.0 in | Wt 173.0 lb

## 2018-05-24 DIAGNOSIS — B182 Chronic viral hepatitis C: Secondary | ICD-10-CM | POA: Diagnosis not present

## 2018-05-24 NOTE — Assessment & Plan Note (Signed)
Upon review of the medication, there is interaction with his PPI and statin drug  will discuss with GI as I have never prescribed this medication and not familiar with all the interactions. If needed will hold statin for 12 weeks He is only taking PPI a few days a week, but also seems H2 blockers have interaction as well

## 2018-05-24 NOTE — Progress Notes (Addendum)
   Subjective:    Patient ID: Tommy Robbins, male    DOB: 1945-06-24, 73 y.o.   MRN: [id number]  Patient presents for Medication Management (would like to dicsuss EPCLUSA - concerned about possible SE)   Pt seen by GI- Dr. Patty Sermons office for the hepatitis C  He has had treatment failure x 2 in the past, but cost was an issue as well  still has active Hep C  Liver US showed cirrhosis   Prescribed EpClusa - this was approved  Pill form daily, medication being sent to GI office but they have not received any medication  He was concerned about any potential medication interactions    Review Of Systems:  GEN- denies fatigue, fever, weight loss,weakness, recent illness HEENT- denies eye drainage, change in vision, nasal discharge, CVS- denies chest pain, palpitations RESP- denies SOB, cough, wheeze ABD- denies N/V, change in stools, abd pain GU- denies dysuria, hematuria, dribbling, incontinence MSK- denies joint pain, muscle aches, injury Neuro- denies headache, dizziness, syncope, seizure activity       Objective:    BP 134/68   Pulse 80   Temp 97.9 F (36.6 C) (Oral)   Resp 16   Ht 5\' 10"  (1.778 m)   Wt 173 lb (78.5 kg)   SpO2 96%   BMI 24.82 kg/m  GEN- NAD, alert and oriented x3 HEENT- PERRL, EOMI, non injected sclera, pink conjunctiva, MMM, oropharynx clear CVS- RRR, no murmur RESP-CTAB ABD-NABS,soft,NT,ND, no HSM EXT- No edema Pulses- Radial 2+        Assessment & Plan:      Discussed with GI- will stop Protonix and lipitor for 12 weeks while on Epclusa Problem List Items Addressed This Visit      Unprioritized   Hepatitis C - Primary    Upon review of the medication, there is interaction with his PPI and statin drug  will discuss with GI as I have never prescribed this medication and not familiar with all the interactions. If needed will hold statin for 12 weeks He is only taking PPI a few days a week, but also seems H2 blockers have interaction as well      Relevant Medications   EPCLUSA 400-100 MG TABS      Note: This dictation was prepared with Dragon dictation along with smaller phrase technology. Any transcriptional errors that result from this process are unintentional.

## 2018-05-24 NOTE — Patient Instructions (Addendum)
I will discuss with Terri about the cholesterol  atorvastatin/lipitor and reflux medication protonix/pantoprazole Continue all other medications  F/U 4 months

## 2018-05-27 ENCOUNTER — Telehealth (INDEPENDENT_AMBULATORY_CARE_PROVIDER_SITE_OTHER): Payer: Self-pay | Admitting: Internal Medicine

## 2018-05-27 ENCOUNTER — Other Ambulatory Visit (INDEPENDENT_AMBULATORY_CARE_PROVIDER_SITE_OTHER): Payer: Self-pay | Admitting: *Deleted

## 2018-05-27 DIAGNOSIS — B182 Chronic viral hepatitis C: Secondary | ICD-10-CM

## 2018-05-27 NOTE — Telephone Encounter (Signed)
Needs Hepatic and hep C quaint in 4 weeks.    Mitzi, OV in 8 weeks

## 2018-05-27 NOTE — Telephone Encounter (Signed)
Labs are noted for 4 weeks and a letter will be sent as a reminder.

## 2018-05-30 ENCOUNTER — Ambulatory Visit (INDEPENDENT_AMBULATORY_CARE_PROVIDER_SITE_OTHER): Payer: BC Managed Care – PPO

## 2018-05-30 DIAGNOSIS — I5022 Chronic systolic (congestive) heart failure: Secondary | ICD-10-CM | POA: Diagnosis not present

## 2018-05-30 DIAGNOSIS — Z95810 Presence of automatic (implantable) cardiac defibrillator: Secondary | ICD-10-CM | POA: Diagnosis not present

## 2018-06-01 ENCOUNTER — Other Ambulatory Visit: Payer: Self-pay

## 2018-06-01 ENCOUNTER — Telehealth: Payer: Self-pay

## 2018-06-01 ENCOUNTER — Ambulatory Visit (INDEPENDENT_AMBULATORY_CARE_PROVIDER_SITE_OTHER): Payer: BC Managed Care – PPO | Admitting: Family Medicine

## 2018-06-01 ENCOUNTER — Encounter: Payer: Self-pay | Admitting: Family Medicine

## 2018-06-01 VITALS — BP 128/74 | HR 58 | Temp 98.4°F | Resp 15 | Wt 171.0 lb

## 2018-06-01 DIAGNOSIS — L03811 Cellulitis of head [any part, except face]: Secondary | ICD-10-CM | POA: Diagnosis not present

## 2018-06-01 DIAGNOSIS — L739 Follicular disorder, unspecified: Secondary | ICD-10-CM

## 2018-06-01 MED ORDER — KETOCONAZOLE 2 % EX SHAM: 1.0000 "application " | MEDICATED_SHAMPOO | CUTANEOUS | 0 refills | Status: DC

## 2018-06-01 MED ORDER — MUPIROCIN 2 % EX OINT: 1.0000 "application " | TOPICAL_OINTMENT | Freq: Two times a day (BID) | CUTANEOUS | 0 refills | Status: AC

## 2018-06-01 MED ORDER — CEPHALEXIN 500 MG PO CAPS: 500.0000 mg | ORAL_CAPSULE | Freq: Three times a day (TID) | ORAL | 0 refills | Status: AC

## 2018-06-01 NOTE — Telephone Encounter (Signed)
Remote ICM transmission received.  Attempted call to patient regarding ICM remote transmission and person answering phone stated he would call back.

## 2018-06-01 NOTE — Progress Notes (Signed)
EPIC Encounter for ICM Monitoring  Patient Name: Tommy Robbins is a 73 y.o. male Date: 06/01/2018 Primary Care Physican: Alycia Rossetti, MD Primary Cardiologist:Berry/Bensimhon Electrophysiologist:Allred Last Weight:170lb Today's Weight:unknown   Attempted call to patient and unable to reach.  Transmission reviewed.   Thoracic impedanceabnormalsince last remote transmission 05/09/2018.   Prescribed:Furosemide20 mgtake1 tabletby mouth daily as needed for fluid.  Labs 04/15/2018 Creatinine0.85, BUN16, Potassium4.2, [id number] 04/11/2018 Creatinine1.18, BUN11, Potassium4.3, [id number], eGFR>60  04/10/2018 Creatinine0.95, BUN17, Potassium5.0, Sodium136, eGFR>60  04/09/2018 Creatinine1.14, BUN19, Potassium4.1, Sodium136, eGFR>60 11/18/2017 Creatinine1.05, BUN18, Potassium4.6, Sodium139, eGFR>60 11/10/2017 Creatinine1.16, BUN46, Potassium5.5, Sodium135, eGFR>60  03/24/2017 Creatinine0.82, BUN12, Potassium4.8, Sodium136, eGFR>60 A complete set of results can be found in Results Review.  Recommendations: Unable to reach.  Will advise to take PRN Furosemide x 3 days if patient returns call.    Follow-up plan: ICM clinic phone appointment on3/16/2020 to recheck fluid levels. Recall for HF clinic appointment 05/11/2018.  Copy of ICM check sent to Dr.Allred and Dr Haroldine Laws.  3 month ICM trend: 05/30/2018    1 Year ICM trend:       Rosalene Billings, RN 06/01/2018 8:26 AM

## 2018-06-01 NOTE — Patient Instructions (Signed)
Call me if you take the keflex and it doesn't improve in 3-4 days, call me and we'll switch to a stronger antibiotic that covers MRSA  When this clears up - at the sign of the next bumps, patches or infection in your scalp, you can use the prescribed shampoo to help treat and prevent worsening infections.  For infections on the body you can get over the counter hibiclens   Folliculitis  Folliculitis is inflammation of the hair follicles. Folliculitis most commonly occurs on the scalp, thighs, legs, back, and buttocks. However, it can occur anywhere on the body. What are the causes? This condition may be caused by:  A bacterial infection (common).  A fungal infection.  A viral infection.  Coming into contact with certain chemicals, especially oils and tars.  Shaving or waxing.  Applying greasy ointments or creams to your skin often. [place]-lasting folliculitis and folliculitis that keeps coming back can be caused by bacteria that live in the nostrils. What increases the risk? This condition is more likely to develop in people with:  A weakened immune system.  Diabetes.  Obesity. What are the signs or symptoms? Symptoms of this condition include:  Redness.  Soreness.  Swelling.  Itching.  Small white or yellow, pus-filled, itchy spots (pustules) that appear over a reddened area. If there is an infection that goes deep into the follicle, these may develop into a boil (furuncle).  A group of closely packed boils (carbuncle). These tend to form in hairy, sweaty areas of the body. How is this diagnosed? This condition is diagnosed with a skin exam. To find what is causing the condition, your health care provider may take a sample of one of the pustules or boils for testing. How is this treated? This condition may be treated by:  Applying warm compresses to the affected areas.  Taking an antibiotic medicine or applying an antibiotic medicine to the skin.  Applying or  bathing with an antiseptic solution.  Taking an over-the-counter medicine to help with itching.  Having a procedure to drain any pustules or boils. This may be done if a pustule or boil contains a lot of pus or fluid.  Laser hair removal. This may be done to treat [place]-lasting folliculitis. Follow these instructions at home:  If directed, apply heat to the affected area as often as told by your health care provider. Use the heat source that your health care provider recommends, such as a moist heat pack or a heating pad. ? Place a towel between your skin and the heat source. ? Leave the heat on for 20-30 minutes. ? Remove the heat if your skin turns bright red. This is especially important if you are unable to feel pain, heat, or cold. You may have a greater risk of getting burned.  If you were prescribed an antibiotic medicine, use it as told by your health care provider. Do not stop using the antibiotic even if you start to feel better.  Take over-the-counter and prescription medicines only as told by your health care provider.  Do not shave irritated skin.  Keep all follow-up visits as told by your health care provider. This is important. Get help right away if:  You have more redness, swelling, or pain in the affected area.  Red streaks are spreading from the affected area.  You have a fever. This information is not intended to replace advice given to you by your health care provider. Make sure you discuss any questions you have  with your health care provider. Document Released: 05/18/2001 Document Revised: 09/27/2015 Document Reviewed: 12/28/2014 Elsevier Interactive Patient Education  2019 [employer].

## 2018-06-01 NOTE — Progress Notes (Signed)
Patient returned call.  He is asymptomatic for fluid symptoms but has been tired lately.  Weight is stable at 171 lbs.  Transmission reviewed and advised to take 1 tablet of PRN Furosemide x 2 - 3 days and then return to PRN.  Advised will recheck fluid levels on 06/06/2018.  Provided direct ICM number and encouraged to call if needed.

## 2018-06-01 NOTE — Progress Notes (Signed)
Patient ID: Tommy Robbins, male    DOB: 01/30/1946, 73 y.o.   MRN: [id number]  PCP: Salley Scarlet, MD  Chief Complaint  Patient presents with   Recurrent Skin Infections    Patient in with c/o sore to back of head. Onset 5 days ago.    Subjective:   Tommy Robbins is a 73 y.o. male, presents to clinic with CC of tender bumps to his scalp, started 5 days ago, they are located at posterior hairline. Hx of recurrent skin infections and MRSA.  He had some of the nodules improve but one remain that has seemed to worsen.  The pain is moderate, worse with palpation or trying to sleep - with his head rubbing against his pillow.  It has seemed to get larger, more tender.  He has tried to apply prescription antibiotic ointment, but admits he has trouble putting it on "right," saying it "goes all over."  He has not had any drainage.  No swollen lymph nodes, fevers, neck pain.    Patient Active Problem List   Diagnosis Date Noted   Gynecomastia 04/15/2018   Cirrhosis of liver (HCC) 04/15/2018   Abdominal pain 04/09/2018   Cholecystitis 04/09/2018   Ischemic cardiomyopathy 03/24/2017   CAD (coronary artery disease), native coronary artery 11/06/2016   Systolic CHF (HCC) 11/06/2016   Acute ST elevation myocardial infarction (STEMI) involving left anterior descending (LAD) coronary artery (HCC) 10/25/2016   Actinic keratosis 07/25/2015   Chronic low back pain    COPD (chronic obstructive pulmonary disease) (HCC) 09/11/2012   Hepatitis C 09/11/2012   GERD (gastroesophageal reflux disease) 09/11/2012   Insomnia 09/11/2012   Hypothyroidism 09/20/2006   HYPERTENSION, BENIGN ESSENTIAL 09/20/2006   ECHOCARDIOGRAM, HX OF 09/20/2006     Prior to Admission medications   Medication Sig Start Date End Date Taking? Authorizing Provider  aspirin EC 81 MG tablet Take 81 mg by mouth daily.   Yes [provider]  cycloSPORINE (RESTASIS) 0.05 % ophthalmic emulsion Place 1  drop into both eyes 2 (two) times daily.   Yes [provider]  diclofenac sodium (VOLTAREN) 1 % GEL Apply 1 application topically daily as needed (for pain).   Yes [provider]  ENTRESTO 24-26 MG TAKE 1 TABLET BY MOUTH TWICE A DAY Patient taking differently: Take 1 tablet by mouth 2 (two) times daily.  02/23/18  Yes Bensimhon, Bevelyn Buckles, MD  EPCLUSA 400-100 MG TABS  05/13/18  Yes [provider]  furosemide (LASIX) 20 MG tablet Take 1 tablet (20 mg total) by mouth daily as needed. Patient taking differently: Take 20 mg by mouth daily as needed for fluid or edema.  01/20/17 11/11/18 Yes Azalee Course, PA  gabapentin (NEURONTIN) 100 MG capsule Take 200 mg by mouth at bedtime.  03/03/17  Yes [provider]  isosorbide mononitrate (IMDUR) 30 MG 24 hr tablet TAKE 1 TABLET BY MOUTH EVERY DAY Patient taking differently: Take 30 mg by mouth daily.  12/20/17  Yes Azalee Course, PA  lidocaine (LIDODERM) 5 % Place 3 patches onto the skin daily as needed (pain).  10/17/14  Yes [provider]  morphine (MS CONTIN) 15 MG 12 hr tablet Take 15 mg by mouth See admin instructions. Take 15 mg by mouth in the morning with 30 mg to equal 45 mg dose 06/30/15  Yes [provider]  morphine (MS CONTIN) 30 MG 12 hr tablet Take 30 mg by mouth See admin instructions. Takes 30 mg  by mouth in the morning with 15 mg to equal 45 mg dose, 30 mg in the afternoon, and 30 mg at night   Yes [provider]  Nutritional Supplements (JUICE PLUS FIBRE PO) Take 2 capsules by mouth 2 (two) times daily.   Yes [provider]  spironolactone (ALDACTONE) 25 MG tablet TAKE 1 TABLET DAILY 05/16/18  Yes [place], Velna Hatchet, MD  SYNTHROID 88 MCG tablet TAKE 1 TABLET DAILY BEFORE BREAKFAST Patient taking differently: Take 88 mcg by mouth daily before breakfast.  04/04/18  Yes [place], Velna Hatchet, MD  temazepam (RESTORIL) 15 MG capsule Take 1 capsule (15 mg total) by mouth at bedtime as needed  for sleep. Patient taking differently: Take 15 mg by mouth every other day.  12/29/17  Yes Dorena Bodo, PA-C  traZODone (DESYREL) 50 MG tablet Take 25 mg by mouth at bedtime.  09/02/16  Yes [provider]  varenicline (CHANTIX STARTING MONTH PAK) 0.5 MG X 11 & 1 MG X 42 tablet Take one 0.5 mg tablet by mouth once daily for 3 days, then increase to one 0.5 mg tablet twice daily for 4 days, then increase to one 1 mg tablet twice daily. 04/15/18  Yes [place], Velna Hatchet, MD  atorvastatin (LIPITOR) 80 MG tablet TAKE 1 TABLET DAILY AT 6PM Patient not taking: No sig reported 08/18/17   Salley Scarlet, MD  metoprolol succinate (TOPROL-XL) 50 MG 24 hr tablet Take one tablet daily with food Patient not taking: Reported on 06/01/2018 12/06/17   Runell Gess, MD  nitroGLYCERIN (NITROSTAT) 0.4 MG SL tablet Place 1 tablet (0.4 mg total) under the tongue every 5 (five) minutes as needed for chest pain. Patient not taking: Reported on 06/01/2018 01/20/17   Azalee Course, PA  pantoprazole (PROTONIX) 40 MG tablet TAKE 1 TABLET DAILY Patient not taking: No sig reported 01/24/18   Salley Scarlet, MD     Allergies  Allergen Reactions   Other Other (See Comments)    Seasonal allergies - sniffling      Family History  Problem Relation Age of Onset   Arthritis Father        rheumatoid   Hypertension Sister    Cancer Sister    Scoliosis Sister    Arthritis Sister    Osteoarthritis Mother    Arthritis/Rheumatoid Sister    Peripheral vascular disease Sister    Arrhythmia Sister    CAD Brother      Social History   Socioeconomic History   Marital status: Divorced    Spouse name: Not on file   Number of children: 3   Years of education: 12   Highest education level: Not on file  Occupational History   Occupation: retired  Ecologist strain: Not hard at [employer] insecurity:    Worry: Never true    Inability: Never true   Transportation  needs:    Medical: No    Non-medical: No  Tobacco Use   Smoking status: Former Smoker    Years: 53.00    Types: Cigarettes    Last attempt to quit: 04/19/2017    Years since quitting: 1.1   Smokeless tobacco: Never Used  Substance and Sexual Activity   Alcohol use: Yes    Alcohol/week: 5.0 standard drinks    Types: 5 Cans of beer per week   Drug use: Yes    Types: Marijuana    Comment: h/o IVDA; "no IVDAsince the 1970s;  last smoked pot ~ 10/2016"   Sexual activity: Not Currently  Lifestyle   Physical activity:    Days per week: Not on file    Minutes per session: Not on file   Stress: Not on file  Relationships   Social connections:    Talks on phone: Not on file    Gets together: Not on file    Attends religious service: Not on file    Active member of club or organization: Not on file    Attends meetings of clubs or organizations: Not on file    Relationship status: Not on file   Intimate partner violence:    Fear of current or ex partner: Not on file    Emotionally abused: Not on file    Physically abused: Not on file    Forced sexual activity: Not on file  Other Topics Concern   Not on file  Social History Narrative   Entered 12/2013:   Lives with his Sister, Niece, and Mother   They take turns caring for mother, who is 20   Quit smoking in 2013     Review of Systems  Constitutional: Negative.   HENT: Negative.   Eyes: Negative.   Respiratory: Negative.   Cardiovascular: Negative.   Gastrointestinal: Negative.   Endocrine: Negative.   Genitourinary: Negative.   Musculoskeletal: Negative.   Skin: Negative.   Allergic/Immunologic: Negative.   Neurological: Negative.   Hematological: Negative.   Psychiatric/Behavioral: Negative.   All other systems reviewed and are negative.      Objective:    Vitals:   06/01/18 1125  BP: 128/74  Pulse: (!) 58  Resp: 15  Temp: 98.4 F (36.9 C)  TempSrc: Oral  SpO2: 95%  Weight: 171 lb (77.6 kg)        Physical Exam Vitals signs and nursing note reviewed.  Constitutional:      General: He is not in acute distress.    Appearance: Normal appearance. He is well-developed. He is not ill-appearing or toxic-appearing.  HENT:     Head: Normocephalic and atraumatic. Hair is normal.     Comments: Tender 1 cm pustule to posterior scalp near hairline, without surrounding edema, erythema or induration.  No purulent drainage    Nose: Nose normal.  Eyes:     General:        Right eye: No discharge.        Left eye: No discharge.     Conjunctiva/sclera: Conjunctivae normal.  Neck:     Trachea: No tracheal deviation.  Cardiovascular:     Rate and Rhythm: Normal rate and regular rhythm.  Pulmonary:     Effort: Pulmonary effort is normal. No respiratory distress.     Breath sounds: No stridor.  Musculoskeletal: Normal range of motion.  Lymphadenopathy:     Head:     Right side of head: No occipital adenopathy.     Left side of head: No occipital adenopathy.     Cervical: No cervical adenopathy.  Skin:    General: Skin is warm and dry.     Findings: No rash.  Neurological:     Mental Status: He is alert.     Motor: No abnormal muscle tone.     Coordination: Coordination normal.  Psychiatric:        Behavior: Behavior normal.           Assessment & Plan:     ICD-10-CM   1. Folliculitis L73.9   2. Cellulitis of  scalp L03.811     Recurrent small infection to scalp, large folliculitis vs early cellulitis.  I have seen him for prior infections that spread quickly to cellulitis over large areas of surrounding scalp.  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  Still would like to start with topical mupirocin, gentle exfoliation, keflex oral antibiotic with any worsening, close follow up if worsening, may need to change to MRSA coverage.   Danelle Berry, PA-C 06/01/18 11:46 AM

## 2018-06-03 ENCOUNTER — Telehealth: Payer: Self-pay | Admitting: Family Medicine

## 2018-06-03 MED ORDER — DOXYCYCLINE HYCLATE 100 MG PO TABS: 100.0000 mg | ORAL_TABLET | Freq: Two times a day (BID) | ORAL | 1 refills | Status: DC

## 2018-06-03 NOTE — Telephone Encounter (Signed)
Patient called and left voicemail stating that the Keflex that was called in for him on Wednesday is not working for the sores in his head. He stated that you said that if not working give [place] a call and something stronger would be called in. Please advise?

## 2018-06-03 NOTE — Telephone Encounter (Signed)
Pt can add doxycycline BID x 5 days to his keflex.  Since it hasn't been 48 hours of abx yet, we cannot say if there is treatment failure - takes usually 48-72 hours.  Doxy covers MRSA, has GI SE, please review administration with him.  Thank you.

## 2018-06-03 NOTE — Telephone Encounter (Signed)
Spoke with patient and informed him of Doxycycline being called in. Advised him to to take with the Keflex and a full 8 ounces of water and sitting up for 30 minutes. Patient verbalized understanding.

## 2018-06-06 ENCOUNTER — Ambulatory Visit (INDEPENDENT_AMBULATORY_CARE_PROVIDER_SITE_OTHER): Payer: BC Managed Care – PPO

## 2018-06-06 DIAGNOSIS — I5022 Chronic systolic (congestive) heart failure: Secondary | ICD-10-CM

## 2018-06-06 DIAGNOSIS — Z95810 Presence of automatic (implantable) cardiac defibrillator: Secondary | ICD-10-CM

## 2018-06-07 NOTE — Progress Notes (Signed)
EPIC Encounter for ICM Monitoring  Patient Name: Tommy Robbins is a 73 y.o. male Date: 06/07/2018 Primary Care Physican: Salley Scarlet, MD Primary Cardiologist:Berry/Bensimhon Electrophysiologist:Allred Last Weight:170lb Today's Weight:168.5 lbs   Spoke with patient.  He lost a couple of pounds after taking PRN Furosemide and feels fine.  Thoracic impedancereturned to normal after taking 3 days of PRN Furosemide.    Prescribed:Furosemide20 mgtake1 tabletby mouth daily as needed for fluid.  Labs 04/15/2018 Creatinine0.85, BUN16, Potassium4.2, [id number] 04/11/2018 Creatinine1.18, BUN11, Potassium4.3, Sodium137, GFR>60  04/10/2018 Creatinine0.95, BUN17, Potassium5.0, Sodium136, GFR>60  04/09/2018 Creatinine1.14, BUN19, Potassium4.1, Sodium136, GFR>60 11/18/2017 Creatinine1.05, BUN18, Potassium4.6, Sodium139, GFR>60 11/10/2017 Creatinine1.16, BUN46, Potassium5.5, Sodium135, GFR>60  03/24/2017 Creatinine0.82, BUN12, Potassium4.8, Sodium136, GFR>60 A complete set of results can be found in Results Review.  Recommendations: No changes and encouraged to call for fluid symptoms.    Follow-up plan: ICM clinic phone appointment on4/14/2020. Recall for HF clinic appointment 05/11/2018 and 06/24/2018 with Gypsy Balsam, NP  Copy of ICM check sent to Dr.Allred.  3 month ICM trend: 06/06/2018    1 Year ICM trend:       Karie Soda, RN 06/07/2018 3:21 PM

## 2018-06-07 NOTE — Telephone Encounter (Signed)
err

## 2018-06-09 ENCOUNTER — Encounter (INDEPENDENT_AMBULATORY_CARE_PROVIDER_SITE_OTHER): Payer: Self-pay | Admitting: *Deleted

## 2018-06-09 ENCOUNTER — Other Ambulatory Visit (INDEPENDENT_AMBULATORY_CARE_PROVIDER_SITE_OTHER): Payer: Self-pay | Admitting: *Deleted

## 2018-06-09 DIAGNOSIS — B182 Chronic viral hepatitis C: Secondary | ICD-10-CM

## 2018-06-10 ENCOUNTER — Telehealth (INDEPENDENT_AMBULATORY_CARE_PROVIDER_SITE_OTHER): Payer: Self-pay | Admitting: Internal Medicine

## 2018-06-10 NOTE — Telephone Encounter (Signed)
Patient called stating he is to have lab work done - please call him at [phone]

## 2018-06-13 NOTE — Telephone Encounter (Signed)
Talked with sister.  Can go at his own discretion. Concerned about the virus.

## 2018-06-21 ENCOUNTER — Other Ambulatory Visit (HOSPITAL_COMMUNITY): Payer: Self-pay | Admitting: Internal Medicine

## 2018-06-24 ENCOUNTER — Other Ambulatory Visit: Payer: Self-pay | Admitting: Family Medicine

## 2018-06-28 ENCOUNTER — Telehealth (INDEPENDENT_AMBULATORY_CARE_PROVIDER_SITE_OTHER): Payer: Self-pay | Admitting: Internal Medicine

## 2018-06-28 LAB — HEPATIC FUNCTION PANEL
AG Ratio: 1.5 (calc) (ref 1.0–2.5)
ALT: 13 U/L (ref 9–46)
AST: 21 U/L (ref 10–35)
Albumin: 3.7 g/dL (ref 3.6–5.1)
Alkaline phosphatase (APISO): 85 U/L (ref 35–144)
Bilirubin, Direct: 0.2 mg/dL (ref 0.0–0.2)
Globulin: 2.4 g/dL (calc) (ref 1.9–3.7)
Indirect Bilirubin: 0.3 mg/dL (calc) (ref 0.2–1.2)
Total Bilirubin: 0.5 mg/dL (ref 0.2–1.2)
Total Protein: 6.1 g/dL (ref 6.1–8.1)

## 2018-06-28 LAB — HEPATITIS C RNA QUANTITATIVE
HCV Quantitative Log: <1.18 Log IU/mL
HCV RNA, PCR, QN: <15 IU/mL

## 2018-06-28 NOTE — Telephone Encounter (Signed)
Tommy Robbins, Recall for [place] RUQ in July. Dx: Hepatitis C, cirrhosis

## 2018-06-28 NOTE — Telephone Encounter (Signed)
[place] noted in recall for JUly

## 2018-07-04 ENCOUNTER — Other Ambulatory Visit: Payer: Self-pay

## 2018-07-04 ENCOUNTER — Ambulatory Visit (INDEPENDENT_AMBULATORY_CARE_PROVIDER_SITE_OTHER): Payer: BC Managed Care – PPO | Admitting: *Deleted

## 2018-07-04 DIAGNOSIS — I5022 Chronic systolic (congestive) heart failure: Secondary | ICD-10-CM

## 2018-07-04 DIAGNOSIS — I255 Ischemic cardiomyopathy: Secondary | ICD-10-CM

## 2018-07-04 LAB — CUP PACEART REMOTE DEVICE CHECK
Battery Remaining Longevity: 120 mo
Battery Voltage: 3.01 V
Brady Statistic AP VP Percent: 0.01 %
Brady Statistic AP VS Percent: 10.16 %
Brady Statistic AS VP Percent: 0.04 %
Brady Statistic AS VS Percent: 89.78 %
Brady Statistic RA Percent Paced: 9.98 %
Brady Statistic RV Percent Paced: 0.05 %
Date Time Interrogation Session: 20200413084225
HighPow Impedance: 62 Ohm
Implantable Lead Implant Date: 20190102
Implantable Lead Implant Date: 20190102
Implantable Lead Location: 753859
Implantable Lead Location: 753860
Implantable Lead Model: 5076
Implantable Pulse Generator Implant Date: 20190102
Lead Channel Impedance Value: 323 Ohm
Lead Channel Impedance Value: 342 Ohm
Lead Channel Impedance Value: 456 Ohm
Lead Channel Pacing Threshold Amplitude: 0.375 V
Lead Channel Pacing Threshold Amplitude: 0.75 V
Lead Channel Pacing Threshold Pulse Width: 0.4 ms
Lead Channel Pacing Threshold Pulse Width: 0.4 ms
Lead Channel Sensing Intrinsic Amplitude: 10.5 mV
Lead Channel Sensing Intrinsic Amplitude: 10.5 mV
Lead Channel Sensing Intrinsic Amplitude: 2.75 mV
Lead Channel Sensing Intrinsic Amplitude: 2.75 mV
Lead Channel Setting Pacing Amplitude: 2 V
Lead Channel Setting Pacing Amplitude: 2.5 V
Lead Channel Setting Pacing Pulse Width: 0.4 ms
Lead Channel Setting Sensing Sensitivity: 0.3 mV

## 2018-07-05 ENCOUNTER — Ambulatory Visit (INDEPENDENT_AMBULATORY_CARE_PROVIDER_SITE_OTHER): Payer: BC Managed Care – PPO

## 2018-07-05 ENCOUNTER — Other Ambulatory Visit: Payer: Self-pay

## 2018-07-05 DIAGNOSIS — I5022 Chronic systolic (congestive) heart failure: Secondary | ICD-10-CM

## 2018-07-05 DIAGNOSIS — Z95810 Presence of automatic (implantable) cardiac defibrillator: Secondary | ICD-10-CM

## 2018-07-06 ENCOUNTER — Other Ambulatory Visit: Payer: Self-pay | Admitting: *Deleted

## 2018-07-06 DIAGNOSIS — G479 Sleep disorder, unspecified: Secondary | ICD-10-CM

## 2018-07-06 MED ORDER — TEMAZEPAM 15 MG PO CAPS: 15.0000 mg | ORAL_CAPSULE | Freq: Every evening | ORAL | 1 refills | Status: AC | PRN

## 2018-07-06 NOTE — Telephone Encounter (Signed)
Received fax requesting refill on Temazepam to mail order.   Ok to refill??  Last office visit 06/01/2018.  Last refill 12/29/2017, #1 refill.

## 2018-07-08 ENCOUNTER — Telehealth: Payer: Self-pay

## 2018-07-08 NOTE — Telephone Encounter (Signed)
Remote ICM transmission received.  Attempted call to patient regarding ICM remote transmission and person answering phone stated he was not available.  

## 2018-07-08 NOTE — Progress Notes (Signed)
EPIC Encounter for ICM Monitoring  Patient Name: Tommy Robbins is a 73 y.o. male Date: 07/08/2018 Primary Care Physican: Salley Scarlet, MD Primary Cardiologist:Berry/Bensimhon Electrophysiologist:Allred Last Weight:168.5lb 07/08/2018 Weight:unknown   Attempted call to patient and unable to reach.  . Transmission reviewed.     Thoracic impedancenormal.  Prescribed:Furosemide20 mgtake1 tabletby mouth daily as needed for fluid.  Labs 04/15/2018 Creatinine0.85, BUN16, Potassium4.2, [id number] 04/11/2018 Creatinine1.18, BUN11, Potassium4.3, Sodium137, GFR>60  04/10/2018 Creatinine0.95, BUN17, Potassium5.0, Sodium136, GFR>60  04/09/2018 Creatinine1.14, BUN19, Potassium4.1, Sodium136, GFR>60 11/18/2017 Creatinine1.05, BUN18, Potassium4.6, Sodium139, GFR>60 11/10/2017 Creatinine1.16, BUN46, Potassium5.5, Sodium135, GFR>60  03/24/2017 Creatinine0.82, BUN12, Potassium4.8, Sodium136, GFR>60 A complete set of results can be found in Results Review.  Recommendations: Unable to reach.  Follow-up plan: ICM clinic phone appointment on5/18/2020.   Copy of ICM check sent to Dr.Allred.  3 month ICM trend: 07/08/2018    1 Year ICM trend:       Karie Soda, RN 07/08/2018 8:41 AM

## 2018-07-15 NOTE — Progress Notes (Signed)
Remote ICD transmission.   

## 2018-07-25 ENCOUNTER — Other Ambulatory Visit: Payer: Self-pay

## 2018-07-25 ENCOUNTER — Other Ambulatory Visit (INDEPENDENT_AMBULATORY_CARE_PROVIDER_SITE_OTHER): Payer: Self-pay | Admitting: *Deleted

## 2018-07-25 ENCOUNTER — Ambulatory Visit (INDEPENDENT_AMBULATORY_CARE_PROVIDER_SITE_OTHER): Payer: BC Managed Care – PPO | Admitting: Internal Medicine

## 2018-07-25 ENCOUNTER — Encounter (INDEPENDENT_AMBULATORY_CARE_PROVIDER_SITE_OTHER): Payer: Self-pay | Admitting: Internal Medicine

## 2018-07-25 VITALS — BP 92/61 | HR 57 | Temp 98.3°F | Ht 70.0 in | Wt 172.2 lb

## 2018-07-25 DIAGNOSIS — B1920 Unspecified viral hepatitis C without hepatic coma: Secondary | ICD-10-CM

## 2018-07-25 DIAGNOSIS — B182 Chronic viral hepatitis C: Secondary | ICD-10-CM

## 2018-07-25 NOTE — Patient Instructions (Signed)
OV in 6 months. 

## 2018-07-25 NOTE — Progress Notes (Signed)
Subjective:    Patient ID: Tommy Robbins, male    DOB: 09-Jan-1946, 73 y.o.   MRN: [id number]  HPI Here today for f/u. Last seen in February of this year as a new patient. Diagnosed in 1998. States he was treated with Interferon and Ribavirin in 1998. In 2005, he was started on Inferon and Ribavirin in [place]. States he did IV drugs about 55 yrs ago. Has had a blood transfusion in 1981.   Genotype 1A.  Is on his last month of Epclusa He tells me he is doing good. No side effects from the Epclusa His appetite isokay. No weight loss. His BMs move normal and he takes a spoonful of Metamucil every am.     06/13/2018 Hep C quaint: undetected.  Hepatic Function Latest Ref Rng & Units 06/13/2018 04/15/2018 04/11/2018  Total Protein 6.1 - 8.1 g/dL 6.1 6.0(L) 5.3(L)  Albumin 3.5 - 5.0 g/dL - - 3.1(L)  AST 10 - 35 U/L 21 65(H) 40  ALT 9 - 46 U/L 13 59(H) 31  Alk Phosphatase 38 - 126 U/L - - 59  Total Bilirubin 0.2 - 1.2 mg/dL 0.5 0.5 0.9  Bilirubin, Direct 0.0 - 0.2 mg/dL 0.2 - -     04/09/2018 Ct abdomen/pelvis with CM:  1. Gallbladder is distended and there is a possible stone in the gallbladder neck, but there is no wall thickening or adjacent inflammation. If clinical findings strongly support acute cholecystitis, consider follow-up right upper quadrant ultrasound for further assessment. 2. Cirrhosis.  Mild splenomegaly. 3. Mild gastrohepatic ligament adenopathy, presumed reactive in the setting of cirrhosis. 4. Dense aortic atherosclerosis. 5. Left colon diverticula without diverticulitis.      04/15/2018 He C quaint positive.  04/09/2018 [place] RUQ: Cirrhotic liver. Recannulization of the umbilical vein and bidirectional flow in the portal vein are consistent with portal venous hypertension. 4. The common bile duct is borderline in caliber. Recommend correlation with labs.   Review of Systems Past Medical History:  Diagnosis Date  . AICD (automatic cardioverter/defibrillator)  present 03/24/2017  . Cataract   . Cellulitis and abscess of right leg 10/06/2016   right thigh  . Chronic low back pain   . Colon polyps   . Depression   . Diverticulosis   . GERD (gastroesophageal reflux disease)   . Hepatitis C   . History of sudden cardiac arrest 10/25/2016   witness/notes 02/10/2017  . Hx of substance abuse ([place])    Archie Endo 02/10/2017  . Hypertension   . Insomnia   . Thyroid disease    hypothyroid    Past Surgical History:  Procedure Laterality Date  . CARDIAC CATHETERIZATION    . CARDIAC DEFIBRILLATOR PLACEMENT  03/24/2017  . IABP INSERTION N/A 10/24/2016   Procedure: IABP Insertion;  Surgeon: Lorretta Harp, MD;  Location: [facility];  Service: Cardiovascular;  Laterality: N/A;  . ICD IMPLANT N/A 03/24/2017   MDT [place] MRI XT DR ICD implanted by Dr Rayann Heman for secondary prevention (VF arrest)  . JOINT REPLACEMENT    . LEFT HEART CATH AND CORONARY ANGIOGRAPHY N/A 10/24/2016   Procedure: LEFT HEART CATH AND CORONARY ANGIOGRAPHY;  Surgeon: Lorretta Harp, MD;  Location: [facility];  Service: Cardiovascular;  Laterality: N/A;  . TOTAL SHOULDER REPLACEMENT Right 03/23/2008    Allergies  Allergen Reactions  . Other Other (See Comments)    Seasonal allergies - sniffling     Current Outpatient Medications on File Prior to Visit  Medication  Sig Dispense Refill  . aspirin EC 81 MG tablet Take 81 mg by mouth daily.    Marland Kitchen atorvastatin (LIPITOR) 80 MG tablet TAKE 1 TABLET DAILY AT 6PM 90 tablet 2  . cycloSPORINE (RESTASIS) 0.05 % ophthalmic emulsion Place 1 drop into both eyes 2 (two) times daily.    . diclofenac sodium (VOLTAREN) 1 % GEL Apply 1 application topically daily as needed (for pain).    Marland Kitchen ENTRESTO 24-26 MG TAKE 1 TABLET BY MOUTH TWICE A DAY 60 tablet 3  . EPCLUSA 400-100 MG TABS     . furosemide (LASIX) 20 MG tablet Take 1 tablet (20 mg total) by mouth daily as needed. (Patient taking differently: Take 20 mg by mouth daily as needed  for fluid or edema. ) 90 tablet 3  . gabapentin (NEURONTIN) 100 MG capsule Take 200 mg by mouth at bedtime. Every other night.  2  . isosorbide mononitrate (IMDUR) 30 MG 24 hr tablet TAKE 1 TABLET BY MOUTH EVERY DAY (Patient taking differently: Take 30 mg by mouth daily. ) 90 tablet 3  . ketoconazole (NIZORAL) 2 % shampoo APPLY TOPICALLY 2 TIMES A WEEK. 120 mL 0  . lidocaine (LIDODERM) 5 % Place 3 patches onto the skin daily as needed (pain).   4  . metoprolol succinate (TOPROL-XL) 50 MG 24 hr tablet Take one tablet daily with food 90 tablet 3  . morphine (MS CONTIN) 15 MG 12 hr tablet Take 15 mg by mouth See admin instructions. Take 15 mg by mouth in the morning with 30 mg to equal 45 mg dose  0  . morphine (MS CONTIN) 30 MG 12 hr tablet Take 30 mg by mouth See admin instructions. Takes 30 mg by mouth in the morning with 15 mg to equal 45 mg dose, 30 mg in the afternoon, and 30 mg at night    . mupirocin ointment (BACTROBAN) 2 % Apply 1 application topically 2 (two) times daily. 22 g 0  . nitroGLYCERIN (NITROSTAT) 0.4 MG SL tablet Place 1 tablet (0.4 mg total) under the tongue every 5 (five) minutes as needed for chest pain. 25 tablet 3  . Nutritional Supplements (JUICE PLUS FIBRE PO) Take 2 capsules by mouth 2 (two) times daily.    Marland Kitchen spironolactone (ALDACTONE) 25 MG tablet TAKE 1 TABLET DAILY 90 tablet 2  . SYNTHROID 88 MCG tablet TAKE 1 TABLET DAILY BEFORE BREAKFAST (Patient taking differently: Take 88 mcg by mouth daily before breakfast. ) 90 tablet 2  . temazepam (RESTORIL) 15 MG capsule Take 1 capsule (15 mg total) by mouth at bedtime as needed for sleep. (Patient taking differently: Take 15 mg by mouth at bedtime as needed for sleep. Every other night) 45 capsule 1  . traZODone (DESYREL) 50 MG tablet Take 25 mg by mouth at bedtime.   2  . varenicline (CHANTIX STARTING MONTH PAK) 0.5 MG X 11 & 1 MG X 42 tablet Take one 0.5 mg tablet by mouth once daily for 3 days, then increase to one 0.5 mg  tablet twice daily for 4 days, then increase to one 1 mg tablet twice daily. 53 tablet 0   No current facility-administered medications on file prior to visit.         Objective:   Physical Exam Blood pressure 92/61, pulse (!) 57, temperature 98.3 F (36.8 C), height '5\' 10"'  (1.778 m), weight 172 lb 3.2 oz (78.1 kg). Alert and oriented. Skin warm and dry. Oral mucosa is moist.   .  Sclera anicteric, conjunctivae is pink. Thyroid not enlarged. No cervical lymphadenopathy. Lungs clear. Heart regular rate and rhythm.  Abdomen is soft. Bowel sounds are positive. No hepatomegaly. No abdominal masses felt. No tenderness.  No edema to lower extremities. Patient is alert and oriented.         Assessment & Plan:  Hepatitis C. He is doing well. Will seee back in 6 months with Dr. Laural Golden.  (May want to screen for varices).

## 2018-07-28 ENCOUNTER — Other Ambulatory Visit: Payer: Self-pay | Admitting: Family Medicine

## 2018-08-08 ENCOUNTER — Ambulatory Visit (INDEPENDENT_AMBULATORY_CARE_PROVIDER_SITE_OTHER): Payer: BC Managed Care – PPO

## 2018-08-08 ENCOUNTER — Other Ambulatory Visit: Payer: Self-pay

## 2018-08-08 DIAGNOSIS — I5022 Chronic systolic (congestive) heart failure: Secondary | ICD-10-CM

## 2018-08-08 DIAGNOSIS — Z95810 Presence of automatic (implantable) cardiac defibrillator: Secondary | ICD-10-CM | POA: Diagnosis not present

## 2018-08-11 NOTE — Progress Notes (Signed)
EPIC Encounter for ICM Monitoring  Patient Name: Tommy Robbins is a 73 y.o. male Date: 08/11/2018 Primary Care Physican: Salley Scarlet, MD Primary Cardiologist:Berry/Bensimhon Electrophysiologist:Allred Last Weight:168.5lb    Transmission reviewed.    Thoracic impedancenormal.  Prescribed:Furosemide20 mgtake1 tabletby mouth daily as needed for fluid.  Labs 04/15/2018 Creatinine0.85, BUN16, Potassium4.2, [id number] 04/11/2018 Creatinine1.18, BUN11, Potassium4.3, Sodium137, GFR>60  04/10/2018 Creatinine0.95, BUN17, Potassium5.0, Sodium136, GFR>60  04/09/2018 Creatinine1.14, BUN19, Potassium4.1, Sodium136, GFR>60 11/18/2017 Creatinine1.05, BUN18, Potassium4.6, Sodium139, GFR>60 11/10/2017 Creatinine1.16, BUN46, Potassium5.5, Sodium135, GFR>60  03/24/2017 Creatinine0.82, BUN12, Potassium4.8, Sodium136, GFR>60 A complete set of results can be found in Results Review.  Recommendations: None  Follow-up plan: ICM clinic phone appointment on6/22/2020.   Copy of ICM check sent to Dr.Allred.   3 month ICM trend: 08/08/2018    1 Year ICM trend:       Karie Soda, RN 08/11/2018 12:15 PM

## 2018-08-22 ENCOUNTER — Other Ambulatory Visit: Payer: Self-pay | Admitting: Family Medicine

## 2018-08-22 ENCOUNTER — Telehealth: Payer: Self-pay | Admitting: Family Medicine

## 2018-08-22 NOTE — Telephone Encounter (Signed)
Pt wants a refill on  doxycycline to [facility] [address] pt states that the abscess on his butt has popped up again and it is irritating him.

## 2018-08-24 MED ORDER — DOXYCYCLINE HYCLATE 100 MG PO TABS: 100.0000 mg | ORAL_TABLET | Freq: Two times a day (BID) | ORAL | 1 refills | Status: AC

## 2018-08-24 NOTE — Telephone Encounter (Signed)
OK to refill

## 2018-08-24 NOTE — Telephone Encounter (Signed)
Med sent and pt aware °

## 2018-08-24 NOTE — Telephone Encounter (Signed)
Okay to refill? 

## 2018-09-10 ENCOUNTER — Other Ambulatory Visit: Payer: Self-pay

## 2018-09-10 ENCOUNTER — Inpatient Hospital Stay (HOSPITAL_COMMUNITY): Payer: BC Managed Care – PPO

## 2018-09-10 ENCOUNTER — Encounter (HOSPITAL_COMMUNITY): Payer: Self-pay | Admitting: Emergency Medicine

## 2018-09-10 ENCOUNTER — Emergency Department (HOSPITAL_COMMUNITY): Payer: BC Managed Care – PPO

## 2018-09-10 ENCOUNTER — Inpatient Hospital Stay (HOSPITAL_COMMUNITY)
Admission: EM | Admit: 2018-09-10 | Discharge: 2018-09-21 | DRG: 208 | Disposition: E | Payer: BC Managed Care – PPO | Attending: Emergency Medicine | Admitting: Emergency Medicine

## 2018-09-10 DIAGNOSIS — J9601 Acute respiratory failure with hypoxia: Principal | ICD-10-CM | POA: Diagnosis present

## 2018-09-10 DIAGNOSIS — Z66 Do not resuscitate: Secondary | ICD-10-CM | POA: Diagnosis not present

## 2018-09-10 DIAGNOSIS — N170 Acute kidney failure with tubular necrosis: Secondary | ICD-10-CM

## 2018-09-10 DIAGNOSIS — I248 Other forms of acute ischemic heart disease: Secondary | ICD-10-CM | POA: Diagnosis not present

## 2018-09-10 DIAGNOSIS — B1920 Unspecified viral hepatitis C without hepatic coma: Secondary | ICD-10-CM | POA: Diagnosis present

## 2018-09-10 DIAGNOSIS — Z79890 Hormone replacement therapy (postmenopausal): Secondary | ICD-10-CM

## 2018-09-10 DIAGNOSIS — E15 Nondiabetic hypoglycemic coma: Secondary | ICD-10-CM | POA: Diagnosis not present

## 2018-09-10 DIAGNOSIS — G4700 Insomnia, unspecified: Secondary | ICD-10-CM | POA: Diagnosis present

## 2018-09-10 DIAGNOSIS — N183 Chronic kidney disease, stage 3 unspecified: Secondary | ICD-10-CM | POA: Diagnosis present

## 2018-09-10 DIAGNOSIS — R570 Cardiogenic shock: Secondary | ICD-10-CM | POA: Diagnosis present

## 2018-09-10 DIAGNOSIS — K219 Gastro-esophageal reflux disease without esophagitis: Secondary | ICD-10-CM | POA: Diagnosis present

## 2018-09-10 DIAGNOSIS — I4891 Unspecified atrial fibrillation: Secondary | ICD-10-CM | POA: Diagnosis present

## 2018-09-10 DIAGNOSIS — D696 Thrombocytopenia, unspecified: Secondary | ICD-10-CM | POA: Diagnosis present

## 2018-09-10 DIAGNOSIS — I469 Cardiac arrest, cause unspecified: Secondary | ICD-10-CM | POA: Diagnosis not present

## 2018-09-10 DIAGNOSIS — I5023 Acute on chronic systolic (congestive) heart failure: Secondary | ICD-10-CM | POA: Diagnosis present

## 2018-09-10 DIAGNOSIS — Z87891 Personal history of nicotine dependence: Secondary | ICD-10-CM

## 2018-09-10 DIAGNOSIS — N179 Acute kidney failure, unspecified: Secondary | ICD-10-CM | POA: Diagnosis present

## 2018-09-10 DIAGNOSIS — Z20828 Contact with and (suspected) exposure to other viral communicable diseases: Secondary | ICD-10-CM | POA: Diagnosis present

## 2018-09-10 DIAGNOSIS — K7460 Unspecified cirrhosis of liver: Secondary | ICD-10-CM | POA: Diagnosis present

## 2018-09-10 DIAGNOSIS — I161 Hypertensive emergency: Secondary | ICD-10-CM | POA: Diagnosis present

## 2018-09-10 DIAGNOSIS — E039 Hypothyroidism, unspecified: Secondary | ICD-10-CM | POA: Diagnosis present

## 2018-09-10 DIAGNOSIS — G8929 Other chronic pain: Secondary | ICD-10-CM | POA: Diagnosis present

## 2018-09-10 DIAGNOSIS — E162 Hypoglycemia, unspecified: Secondary | ICD-10-CM | POA: Diagnosis present

## 2018-09-10 DIAGNOSIS — J440 Chronic obstructive pulmonary disease with acute lower respiratory infection: Secondary | ICD-10-CM | POA: Diagnosis present

## 2018-09-10 DIAGNOSIS — Z8249 Family history of ischemic heart disease and other diseases of the circulatory system: Secondary | ICD-10-CM

## 2018-09-10 DIAGNOSIS — I2510 Atherosclerotic heart disease of native coronary artery without angina pectoris: Secondary | ICD-10-CM | POA: Diagnosis present

## 2018-09-10 DIAGNOSIS — I130 Hypertensive heart and chronic kidney disease with heart failure and stage 1 through stage 4 chronic kidney disease, or unspecified chronic kidney disease: Secondary | ICD-10-CM | POA: Diagnosis present

## 2018-09-10 DIAGNOSIS — I255 Ischemic cardiomyopathy: Secondary | ICD-10-CM | POA: Diagnosis present

## 2018-09-10 DIAGNOSIS — I10 Essential (primary) hypertension: Secondary | ICD-10-CM | POA: Diagnosis present

## 2018-09-10 DIAGNOSIS — I493 Ventricular premature depolarization: Secondary | ICD-10-CM | POA: Diagnosis present

## 2018-09-10 DIAGNOSIS — I5031 Acute diastolic (congestive) heart failure: Secondary | ICD-10-CM

## 2018-09-10 DIAGNOSIS — E872 Acidosis: Secondary | ICD-10-CM | POA: Diagnosis present

## 2018-09-10 DIAGNOSIS — Z452 Encounter for adjustment and management of vascular access device: Secondary | ICD-10-CM

## 2018-09-10 DIAGNOSIS — J189 Pneumonia, unspecified organism: Secondary | ICD-10-CM | POA: Diagnosis present

## 2018-09-10 DIAGNOSIS — I2582 Chronic total occlusion of coronary artery: Secondary | ICD-10-CM | POA: Diagnosis present

## 2018-09-10 DIAGNOSIS — Z79899 Other long term (current) drug therapy: Secondary | ICD-10-CM

## 2018-09-10 DIAGNOSIS — Z7982 Long term (current) use of aspirin: Secondary | ICD-10-CM

## 2018-09-10 DIAGNOSIS — M545 Low back pain: Secondary | ICD-10-CM | POA: Diagnosis present

## 2018-09-10 DIAGNOSIS — J449 Chronic obstructive pulmonary disease, unspecified: Secondary | ICD-10-CM | POA: Diagnosis present

## 2018-09-10 DIAGNOSIS — I214 Non-ST elevation (NSTEMI) myocardial infarction: Secondary | ICD-10-CM | POA: Diagnosis present

## 2018-09-10 DIAGNOSIS — Z96611 Presence of right artificial shoulder joint: Secondary | ICD-10-CM | POA: Diagnosis present

## 2018-09-10 DIAGNOSIS — Z95810 Presence of automatic (implantable) cardiac defibrillator: Secondary | ICD-10-CM

## 2018-09-10 LAB — POCT I-STAT EG7
Acid-base deficit: 16 mmol/L — ABNORMAL HIGH (ref 0.0–2.0)
Bicarbonate: 15.3 mmol/L — ABNORMAL LOW (ref 20.0–28.0)
Calcium, Ion: 0.94 mmol/L — ABNORMAL LOW (ref 1.15–1.40)
HCT: 32 % — ABNORMAL LOW (ref 39.0–52.0)
Hemoglobin: 10.9 g/dL — ABNORMAL LOW (ref 13.0–17.0)
O2 Saturation: 32 %
Patient temperature: 99.2
Potassium: 4 mmol/L (ref 3.5–5.1)
Sodium: 140 mmol/L (ref 135–145)
TCO2: 17 mmol/L — ABNORMAL LOW (ref 22–32)
pCO2, Ven: 66.4 mmHg — ABNORMAL HIGH (ref 44.0–60.0)
pH, Ven: 6.972 — CL (ref 7.250–7.430)
pO2, Ven: 32 mmHg (ref 32.0–45.0)

## 2018-09-10 LAB — COMPREHENSIVE METABOLIC PANEL
ALT: 22 U/L (ref 0–44)
AST: 72 U/L — ABNORMAL HIGH (ref 15–41)
Albumin: 3.1 g/dL — ABNORMAL LOW (ref 3.5–5.0)
Alkaline Phosphatase: 67 U/L (ref 38–126)
Anion gap: 23 — ABNORMAL HIGH (ref 5–15)
BUN: 67 mg/dL — ABNORMAL HIGH (ref 8–23)
CO2: 11 mmol/L — ABNORMAL LOW (ref 22–32)
Calcium: 8.2 mg/dL — ABNORMAL LOW (ref 8.9–10.3)
Chloride: 102 mmol/L (ref 98–111)
Creatinine, Ser: 4.26 mg/dL — ABNORMAL HIGH (ref 0.61–1.24)
GFR calc Af Amer: 15 mL/min — ABNORMAL LOW (ref >60)
GFR calc non Af Amer: 13 mL/min — ABNORMAL LOW (ref >60)
Glucose, Bld: 76 mg/dL (ref 70–99)
Potassium: 5 mmol/L (ref 3.5–5.1)
Sodium: 136 mmol/L (ref 135–145)
Total Bilirubin: 1.8 mg/dL — ABNORMAL HIGH (ref 0.3–1.2)
Total Protein: 6.4 g/dL — ABNORMAL LOW (ref 6.5–8.1)

## 2018-09-10 LAB — BASIC METABOLIC PANEL
Anion gap: 19 — ABNORMAL HIGH (ref 5–15)
BUN: 59 mg/dL — ABNORMAL HIGH (ref 8–23)
CO2: 16 mmol/L — ABNORMAL LOW (ref 22–32)
Calcium: 8 mg/dL — ABNORMAL LOW (ref 8.9–10.3)
Chloride: 98 mmol/L (ref 98–111)
Creatinine, Ser: 3.88 mg/dL — ABNORMAL HIGH (ref 0.61–1.24)
GFR calc Af Amer: 17 mL/min — ABNORMAL LOW (ref >60)
GFR calc non Af Amer: 15 mL/min — ABNORMAL LOW (ref >60)
Glucose, Bld: 82 mg/dL (ref 70–99)
Potassium: 4.9 mmol/L (ref 3.5–5.1)
Sodium: 133 mmol/L — ABNORMAL LOW (ref 135–145)

## 2018-09-10 LAB — POCT I-STAT 7, (LYTES, BLD GAS, ICA,H+H)
Acid-base deficit: 11 mmol/L — ABNORMAL HIGH (ref 0.0–2.0)
Bicarbonate: 17.1 mmol/L — ABNORMAL LOW (ref 20.0–28.0)
Calcium, Ion: 1.06 mmol/L — ABNORMAL LOW (ref 1.15–1.40)
HCT: 47 % (ref 39.0–52.0)
Hemoglobin: 16 g/dL (ref 13.0–17.0)
O2 Saturation: 100 %
Patient temperature: 98.1
Potassium: 4.8 mmol/L (ref 3.5–5.1)
Sodium: 131 mmol/L — ABNORMAL LOW (ref 135–145)
TCO2: 18 mmol/L — ABNORMAL LOW (ref 22–32)
pCO2 arterial: 45.2 mmHg (ref 32.0–48.0)
pH, Arterial: 7.184 — CL (ref 7.350–7.450)
pO2, Arterial: 398 mmHg — ABNORMAL HIGH (ref 83.0–108.0)

## 2018-09-10 LAB — I-STAT CHEM 8, ED
BUN: 54 mg/dL — ABNORMAL HIGH (ref 8–23)
Calcium, Ion: 0.91 mmol/L — ABNORMAL LOW (ref 1.15–1.40)
Chloride: 102 mmol/L (ref 98–111)
Creatinine, Ser: 3.5 mg/dL — ABNORMAL HIGH (ref 0.61–1.24)
Glucose, Bld: 80 mg/dL (ref 70–99)
HCT: 47 % (ref 39.0–52.0)
Hemoglobin: 16 g/dL (ref 13.0–17.0)
Potassium: 4.7 mmol/L (ref 3.5–5.1)
Sodium: 132 mmol/L — ABNORMAL LOW (ref 135–145)
TCO2: 19 mmol/L — ABNORMAL LOW (ref 22–32)

## 2018-09-10 LAB — BLOOD GAS, ARTERIAL
Acid-base deficit: 17.8 mmol/L — ABNORMAL HIGH (ref 0.0–2.0)
Bicarbonate: 10 mmol/L — ABNORMAL LOW (ref 20.0–28.0)
Drawn by: 33099
FIO2: 0.6
MECHVT: 580 mL
O2 Saturation: 95.7 %
PEEP: 5 cmH2O
Patient temperature: 99
RATE: 28 resp/min
pCO2 arterial: 34.1 mmHg (ref 32.0–48.0)
pH, Arterial: 7.096 — CL (ref 7.350–7.450)
pO2, Arterial: 104 mmHg (ref 83.0–108.0)

## 2018-09-10 LAB — CBC
HCT: 46.3 % (ref 39.0–52.0)
HCT: 50.3 % (ref 39.0–52.0)
Hemoglobin: 14.9 g/dL (ref 13.0–17.0)
Hemoglobin: 16.5 g/dL (ref 13.0–17.0)
MCH: 30.7 pg (ref 26.0–34.0)
MCH: 30.7 pg (ref 26.0–34.0)
MCHC: 32.2 g/dL (ref 30.0–36.0)
MCHC: 32.8 g/dL (ref 30.0–36.0)
MCV: 95.5 fL (ref 80.0–100.0)
MCV: 93.5 fL (ref 80.0–100.0)
Platelets: 44 10*3/uL — ABNORMAL LOW (ref 150–400)
Platelets: 41 10*3/uL — ABNORMAL LOW (ref 150–400)
RBC: 4.85 MIL/uL (ref 4.22–5.81)
RBC: 5.38 MIL/uL (ref 4.22–5.81)
RDW: 13 % (ref 11.5–15.5)
RDW: 13.1 % (ref 11.5–15.5)
WBC: 3.7 10*3/uL — ABNORMAL LOW (ref 4.0–10.5)
WBC: 3.1 10*3/uL — ABNORMAL LOW (ref 4.0–10.5)
nRBC: 0 % (ref 0.0–0.2)
nRBC: 0 % (ref 0.0–0.2)

## 2018-09-10 LAB — D-DIMER, QUANTITATIVE: D-Dimer, Quant: >20 ug/mL-FEU — ABNORMAL HIGH (ref 0.00–0.50)

## 2018-09-10 LAB — FIBRINOGEN: Fibrinogen: 384 mg/dL (ref 210–475)

## 2018-09-10 LAB — C-REACTIVE PROTEIN: CRP: 25.1 mg/dL — ABNORMAL HIGH (ref <1.0)

## 2018-09-10 LAB — FERRITIN: Ferritin: 280 ng/mL (ref 24–336)

## 2018-09-10 LAB — LACTATE DEHYDROGENASE: LDH: 312 U/L — ABNORMAL HIGH (ref 98–192)

## 2018-09-10 LAB — GLUCOSE, CAPILLARY
Glucose-Capillary: 85 mg/dL (ref 70–99)
Glucose-Capillary: 18 mg/dL — CL (ref 70–99)
Glucose-Capillary: 17 mg/dL — CL (ref 70–99)
Glucose-Capillary: 158 mg/dL — ABNORMAL HIGH (ref 70–99)

## 2018-09-10 LAB — COOXEMETRY PANEL
Carboxyhemoglobin: 0.5 % (ref 0.5–1.5)
Carboxyhemoglobin: 0.2 % — ABNORMAL LOW (ref 0.5–1.5)
Methemoglobin: 0.7 % (ref 0.0–1.5)
Methemoglobin: 0.7 % (ref 0.0–1.5)
O2 Saturation: 79.2 %
O2 Saturation: 64.2 %
Total hemoglobin: 15 g/dL (ref 12.0–16.0)
Total hemoglobin: 10.5 g/dL — ABNORMAL LOW (ref 12.0–16.0)

## 2018-09-10 LAB — LACTIC ACID, PLASMA
Lactic Acid, Venous: 4.2 mmol/L (ref 0.5–1.9)
Lactic Acid, Venous: 6.5 mmol/L (ref 0.5–1.9)
Lactic Acid, Venous: >11 mmol/L (ref 0.5–1.9)

## 2018-09-10 LAB — TRIGLYCERIDES
Triglycerides: 189 mg/dL — ABNORMAL HIGH (ref <150)
Triglycerides: 176 mg/dL — ABNORMAL HIGH (ref <150)

## 2018-09-10 LAB — BRAIN NATRIURETIC PEPTIDE
B Natriuretic Peptide: 546.7 pg/mL — ABNORMAL HIGH (ref 0.0–100.0)
B Natriuretic Peptide: 1191.9 pg/mL — ABNORMAL HIGH (ref 0.0–100.0)

## 2018-09-10 LAB — TROPONIN I
Troponin I: 1.03 ng/mL (ref <0.03)
Troponin I: 4.38 ng/mL (ref <0.03)
Troponin I: 7.46 ng/mL (ref <0.03)

## 2018-09-10 LAB — PROCALCITONIN
Procalcitonin: 15.95 ng/mL
Procalcitonin: 19.8 ng/mL

## 2018-09-10 LAB — SARS CORONAVIRUS 2 BY RT PCR (HOSPITAL ORDER, PERFORMED IN ~~LOC~~ HOSPITAL LAB): SARS Coronavirus 2: NEGATIVE

## 2018-09-10 LAB — CORTISOL: Cortisol, Plasma: 92.4 ug/dL

## 2018-09-10 LAB — MAGNESIUM: Magnesium: 4.1 mg/dL — ABNORMAL HIGH (ref 1.7–2.4)

## 2018-09-10 LAB — MRSA PCR SCREENING: MRSA by PCR: POSITIVE — AB

## 2018-09-10 LAB — PHOSPHORUS: Phosphorus: 11 mg/dL — ABNORMAL HIGH (ref 2.5–4.6)

## 2018-09-10 IMAGING — DX DG CHEST 1V PORT
1 series · 1 of 1 positions shown · non-contrast
Comparison: 02/27/2009

CLINICAL DATA: Chest pain.  Recent cardiac catheterization.

EXAM:
PORTABLE CHEST 1 VIEW

[chest ap]
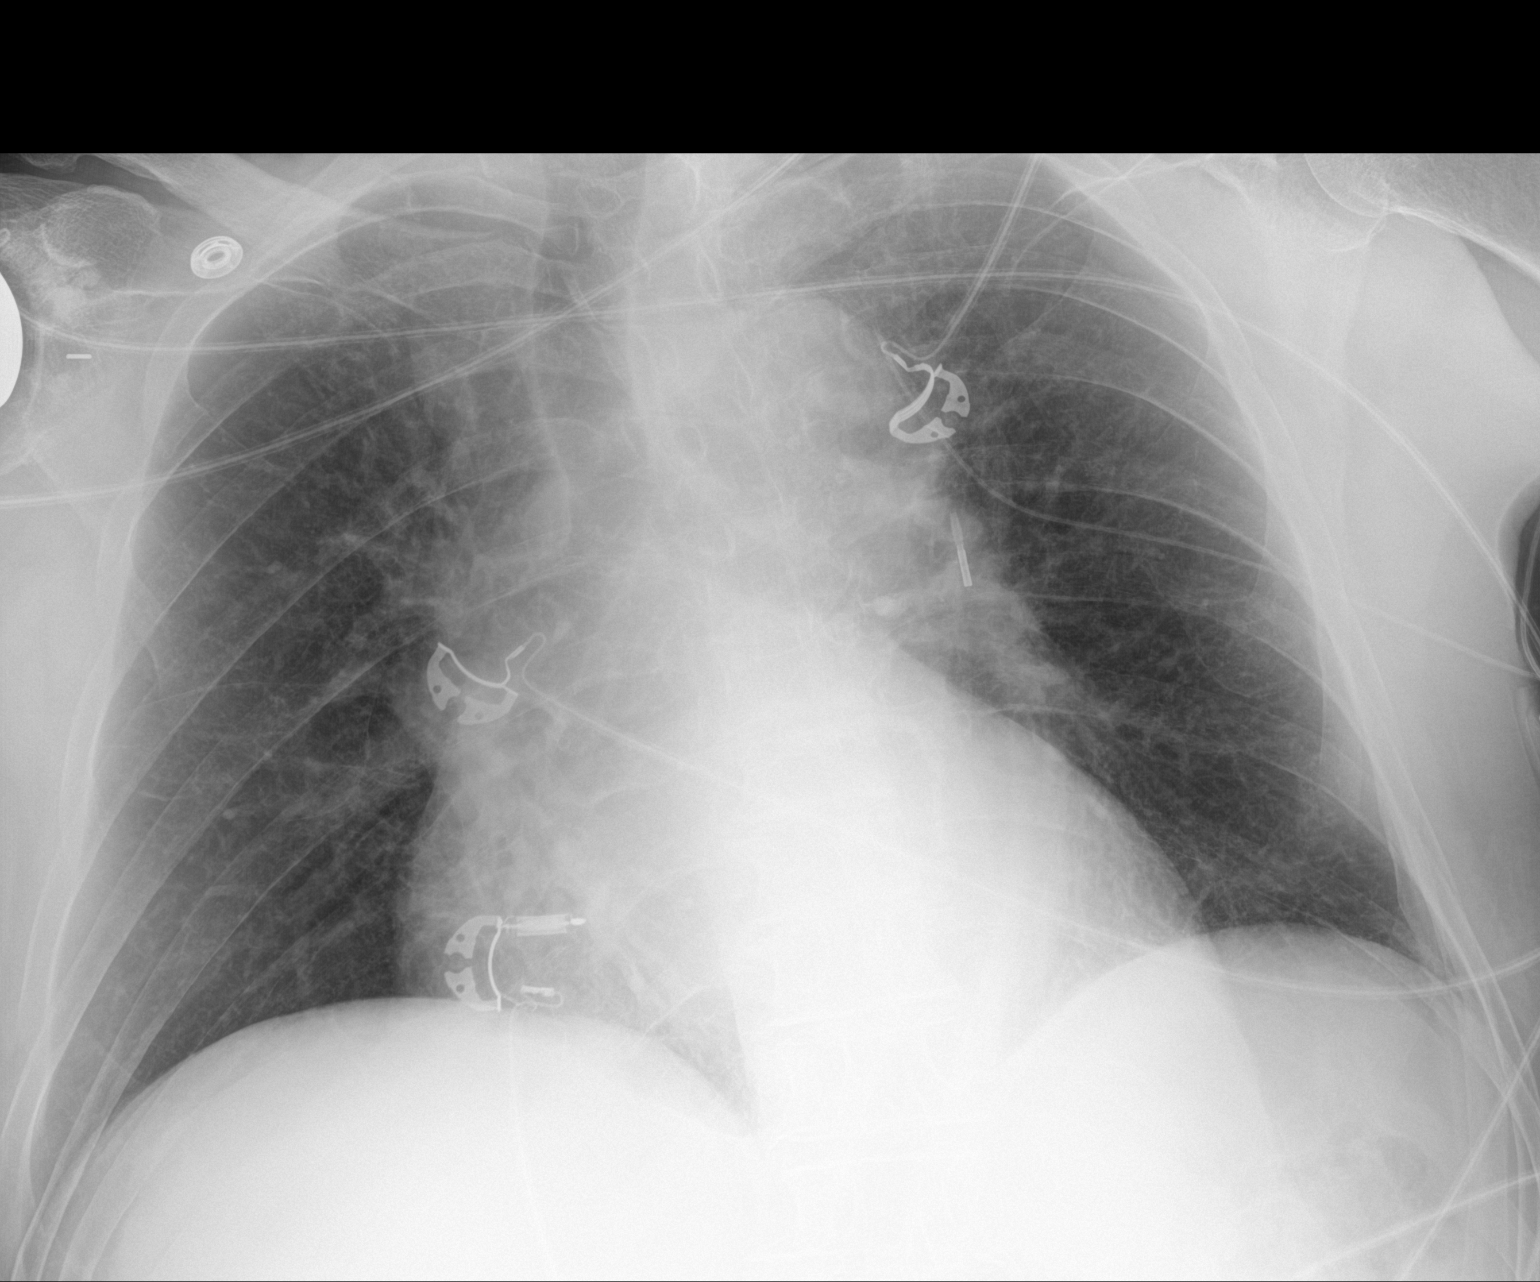

[1 of 1 positions shown; findings below may reference images not displayed]

FINDINGS: Portable view of the chest demonstrates the radiopaque tip of
intra-aortic balloon pump in the upper/ mid descending thoracic
aorta. Lungs are clear without focal airspace disease or pulmonary
edema. No evidence for a pneumothorax. Heart size is within normal
limits.
IMPRESSION: Intra aortic balloon pump positioned in the descending thoracic
aorta.

No focal lung disease.

## 2018-09-10 MED ORDER — MILRINONE LACTATE IN DEXTROSE 20-5 MG/100ML-% IV SOLN
0.2500 ug/kg/min | INTRAVENOUS | Status: DC
  Filled 2018-09-10: qty 100

## 2018-09-10 MED ORDER — NOREPINEPHRINE 4 MG/250ML-% IV SOLN
0.0000 ug/min | INTRAVENOUS | Status: DC
  Administered 2018-09-10: 21:00:00 4 mg via INTRAVENOUS
  Administered 2018-09-10: 2 ug/min via INTRAVENOUS

## 2018-09-10 MED ORDER — INSULIN ASPART 100 UNIT/ML ~~LOC~~ SOLN: 0.0000 [IU] | SUBCUTANEOUS | Status: DC

## 2018-09-10 MED ORDER — FENTANYL 2500MCG IN NS 250ML (10MCG/ML) PREMIX INFUSION
25.0000 ug/h | INTRAVENOUS | Status: DC
  Administered 2018-09-10: 16:00:00 200 ug/h via INTRAVENOUS

## 2018-09-10 MED ORDER — MIDAZOLAM HCL 2 MG/2ML IJ SOLN
INTRAMUSCULAR | Status: AC
  Filled 2018-09-10: qty 2

## 2018-09-10 MED ORDER — CYCLOSPORINE 0.05 % OP EMUL
1.0000 [drp] | Freq: Two times a day (BID) | OPHTHALMIC | Status: DC
  Administered 2018-09-10: 22:00:00 1 [drp] via OPHTHALMIC
  Filled 2018-09-10: qty 30

## 2018-09-10 MED ORDER — DOCUSATE SODIUM 50 MG/5ML PO LIQD: 100.0000 mg | Freq: Two times a day (BID) | ORAL | Status: DC | PRN

## 2018-09-10 MED ORDER — MIDAZOLAM HCL 5 MG/5ML IJ SOLN
INTRAMUSCULAR | Status: DC | PRN
  Administered 2018-09-10: 2 mg via INTRAVENOUS

## 2018-09-10 MED ORDER — BISACODYL 10 MG RE SUPP: 10.0000 mg | Freq: Every day | RECTAL | Status: DC | PRN

## 2018-09-10 MED ORDER — MIDAZOLAM HCL 2 MG/2ML IJ SOLN
2.0000 mg | Freq: Once | INTRAMUSCULAR | Status: AC
  Administered 2018-09-10: 16:00:00 2 mg via INTRAVENOUS
  Filled 2018-09-10: qty 2

## 2018-09-10 MED ORDER — PANTOPRAZOLE SODIUM 40 MG IV SOLR
40.0000 mg | Freq: Every day | INTRAVENOUS | Status: DC
  Administered 2018-09-10: 21:00:00 40 mg via INTRAVENOUS
  Filled 2018-09-10: qty 40

## 2018-09-10 MED ORDER — DEXMEDETOMIDINE HCL IN NACL 400 MCG/100ML IV SOLN
0.4000 ug/kg/h | INTRAVENOUS | Status: DC
  Administered 2018-09-10: 18:00:00 0.4 ug/kg/h via INTRAVENOUS
  Filled 2018-09-10: qty 100

## 2018-09-10 MED ORDER — FENTANYL CITRATE (PF) 100 MCG/2ML IJ SOLN
25.0000 ug | Freq: Once | INTRAMUSCULAR | Status: AC
  Administered 2018-09-10: 25 ug via INTRAVENOUS
  Filled 2018-09-10: qty 2

## 2018-09-10 MED ORDER — FUROSEMIDE 10 MG/ML IJ SOLN
40.0000 mg | Freq: Two times a day (BID) | INTRAMUSCULAR | Status: DC
  Administered 2018-09-10: 17:00:00 40 mg via INTRAVENOUS
  Filled 2018-09-10: qty 4

## 2018-09-10 MED ORDER — SODIUM CHLORIDE 0.9 % IV SOLN
1.0000 g | Freq: Once | INTRAVENOUS | Status: AC
  Administered 2018-09-10: 12:00:00 1 g via INTRAVENOUS
  Filled 2018-09-10: qty 10

## 2018-09-10 MED ORDER — SODIUM BICARBONATE 8.4 % IV SOLN
INTRAVENOUS | Status: AC
  Administered 2018-09-10: 23:00:00 50 meq
  Filled 2018-09-10: qty 50

## 2018-09-10 MED ORDER — DEXTROSE 50 % IV SOLN
INTRAVENOUS | Status: AC
  Administered 2018-09-10: 21:00:00 50 mL
  Filled 2018-09-10: qty 50

## 2018-09-10 MED ORDER — MIDAZOLAM HCL 2 MG/2ML IJ SOLN
1.0000 mg | INTRAMUSCULAR | Status: DC | PRN
  Administered 2018-09-10: 17:00:00 1 mg via INTRAVENOUS
  Filled 2018-09-10: qty 2

## 2018-09-10 MED ORDER — DEXTROSE 10 % IV SOLN
INTRAVENOUS | Status: DC
  Administered 2018-09-10: 23:00:00 via INTRAVENOUS

## 2018-09-10 MED ORDER — TRAZODONE HCL 50 MG PO TABS
25.0000 mg | ORAL_TABLET | Freq: Every day | ORAL | Status: DC
  Administered 2018-09-10: 22:00:00 25 mg
  Filled 2018-09-10: qty 1

## 2018-09-10 MED ORDER — SODIUM CHLORIDE 0.45 % IV SOLN
INTRAVENOUS | Status: DC
  Administered 2018-09-10: 17:00:00 via INTRAVENOUS

## 2018-09-10 MED ORDER — DIGOXIN 0.25 MG/ML IJ SOLN: 0.2500 mg | Freq: Once | INTRAMUSCULAR | Status: DC

## 2018-09-10 MED ORDER — ALBUTEROL SULFATE (2.5 MG/3ML) 0.083% IN NEBU: 2.5000 mg | INHALATION_SOLUTION | RESPIRATORY_TRACT | Status: DC | PRN

## 2018-09-10 MED ORDER — ETOMIDATE 2 MG/ML IV SOLN
INTRAVENOUS | Status: DC | PRN
  Administered 2018-09-10: 20 mg via INTRAVENOUS

## 2018-09-10 MED ORDER — SODIUM BICARBONATE 8.4 % IV SOLN
INTRAVENOUS | Status: DC
  Administered 2018-09-10: 22:00:00 via INTRAVENOUS
  Filled 2018-09-10 (×2): qty 150

## 2018-09-10 MED ORDER — PROPOFOL 1000 MG/100ML IV EMUL: 5.0000 ug/kg/min | INTRAVENOUS | Status: DC

## 2018-09-10 MED ORDER — SODIUM BICARBONATE 8.4 % IV SOLN
INTRAVENOUS | Status: AC
  Administered 2018-09-10: 23:00:00 150 meq
  Filled 2018-09-10: qty 50

## 2018-09-10 MED ORDER — SODIUM BICARBONATE 8.4 % IV SOLN
INTRAVENOUS | Status: AC
  Administered 2018-09-10: 100 meq
  Filled 2018-09-10: qty 100

## 2018-09-10 MED ORDER — PROPOFOL 1000 MG/100ML IV EMUL
INTRAVENOUS | Status: AC
  Filled 2018-09-10: qty 100

## 2018-09-10 MED ORDER — SODIUM CHLORIDE 0.9 % IV SOLN
1.0000 g | INTRAVENOUS | Status: DC
  Filled 2018-09-10: qty 10

## 2018-09-10 MED ORDER — LEVOTHYROXINE SODIUM 88 MCG PO TABS
88.0000 ug | ORAL_TABLET | Freq: Every day | ORAL | Status: DC
  Filled 2018-09-10: qty 1

## 2018-09-10 MED ORDER — FENTANYL BOLUS VIA INFUSION
25.0000 ug | INTRAVENOUS | Status: DC | PRN
  Filled 2018-09-10: qty 25

## 2018-09-10 MED ORDER — ASPIRIN 300 MG RE SUPP: 150.0000 mg | Freq: Every day | RECTAL | Status: DC

## 2018-09-10 MED ORDER — ONDANSETRON HCL 4 MG/2ML IJ SOLN: 4.0000 mg | Freq: Four times a day (QID) | INTRAMUSCULAR | Status: DC | PRN

## 2018-09-10 MED ORDER — SODIUM CHLORIDE 0.9 % IV SOLN
500.0000 mg | INTRAVENOUS | Status: DC
  Filled 2018-09-10: qty 500

## 2018-09-10 MED ORDER — LEVOTHYROXINE SODIUM 88 MCG PO TABS: 88.0000 ug | ORAL_TABLET | Freq: Every day | ORAL | Status: DC

## 2018-09-10 MED ORDER — SODIUM BICARBONATE 8.4 % IV SOLN
150.0000 meq | Freq: Once | INTRAVENOUS | Status: AC
  Administered 2018-09-10: 150 meq via INTRAVENOUS

## 2018-09-10 MED ORDER — ASPIRIN 81 MG PO CHEW
81.0000 mg | CHEWABLE_TABLET | Freq: Every day | ORAL | Status: DC
  Administered 2018-09-10: 17:00:00 81 mg via ORAL
  Filled 2018-09-10: qty 1

## 2018-09-10 MED ORDER — SODIUM BICARBONATE 8.4 % IV SOLN
INTRAVENOUS | Status: AC
  Administered 2018-09-10: 150 meq via INTRAVENOUS
  Filled 2018-09-10: qty 150

## 2018-09-10 MED ORDER — ATORVASTATIN CALCIUM 80 MG PO TABS
80.0000 mg | ORAL_TABLET | Freq: Every day | ORAL | Status: DC
  Administered 2018-09-10: 17:00:00 80 mg
  Filled 2018-09-10: qty 1

## 2018-09-10 MED ORDER — DEXMEDETOMIDINE HCL IN NACL 400 MCG/100ML IV SOLN
0.0000 ug/kg/h | INTRAVENOUS | Status: DC
  Administered 2018-09-10: 21:00:00 0.5 ug/kg/h via INTRAVENOUS
  Filled 2018-09-10: qty 100

## 2018-09-10 MED ORDER — SODIUM CHLORIDE 0.9 % IV BOLUS
500.0000 mL | Freq: Once | INTRAVENOUS | Status: AC
  Administered 2018-09-10: 19:00:00 500 mL via INTRAVENOUS

## 2018-09-10 MED ORDER — NOREPINEPHRINE 4 MG/250ML-% IV SOLN
INTRAVENOUS | Status: AC
  Administered 2018-09-10: 21:00:00 4 mg via INTRAVENOUS
  Filled 2018-09-10: qty 250

## 2018-09-10 MED ORDER — ASPIRIN EC 81 MG PO TBEC: 81.0000 mg | DELAYED_RELEASE_TABLET | Freq: Every day | ORAL | Status: DC

## 2018-09-10 MED ORDER — FENTANYL 2500MCG IN NS 250ML (10MCG/ML) PREMIX INFUSION
0.0000 ug/h | INTRAVENOUS | Status: DC
  Administered 2018-09-10: 50 ug/h via INTRAVENOUS
  Filled 2018-09-10: qty 250

## 2018-09-10 MED ORDER — SODIUM CHLORIDE 0.9 % IV SOLN
500.0000 mg | Freq: Once | INTRAVENOUS | Status: AC
  Administered 2018-09-10: 500 mg via INTRAVENOUS
  Filled 2018-09-10: qty 500

## 2018-09-10 MED ORDER — VASOPRESSIN 20 UNIT/ML IV SOLN
0.0400 [IU]/min | INTRAVENOUS | Status: DC
  Filled 2018-09-10 (×2): qty 2

## 2018-09-10 MED ORDER — HEPARIN SODIUM (PORCINE) 5000 UNIT/ML IJ SOLN
5000.0000 [IU] | Freq: Three times a day (TID) | INTRAMUSCULAR | Status: DC
  Administered 2018-09-10: 5000 [IU] via SUBCUTANEOUS
  Filled 2018-09-10: qty 1

## 2018-09-10 MED ORDER — FUROSEMIDE 10 MG/ML IJ SOLN
40.0000 mg | Freq: Once | INTRAMUSCULAR | Status: AC
  Administered 2018-09-10: 40 mg via INTRAVENOUS
  Filled 2018-09-10: qty 4

## 2018-09-10 MED ORDER — MIDAZOLAM HCL 2 MG/2ML IJ SOLN: 1.0000 mg | INTRAMUSCULAR | Status: DC | PRN

## 2018-09-10 MED ORDER — PHENYLEPHRINE HCL-NACL 10-0.9 MG/250ML-% IV SOLN
0.0000 ug/min | INTRAVENOUS | Status: DC
  Administered 2018-09-10: 13.333 ug/min via INTRAVENOUS
  Administered 2018-09-10: 20:00:00 200 ug/min via INTRAVENOUS
  Filled 2018-09-10 (×3): qty 250

## 2018-09-11 LAB — COMPREHENSIVE METABOLIC PANEL
ALT: 64 U/L — ABNORMAL HIGH (ref 0–44)
AST: 141 U/L — ABNORMAL HIGH (ref 15–41)
Albumin: 1.7 g/dL — ABNORMAL LOW (ref 3.5–5.0)
Alkaline Phosphatase: 44 U/L (ref 38–126)
Anion gap: 29 — ABNORMAL HIGH (ref 5–15)
BUN: 70 mg/dL — ABNORMAL HIGH (ref 8–23)
CO2: 15 mmol/L — ABNORMAL LOW (ref 22–32)
Calcium: 6.9 mg/dL — ABNORMAL LOW (ref 8.9–10.3)
Chloride: 101 mmol/L (ref 98–111)
Creatinine, Ser: 5.01 mg/dL — ABNORMAL HIGH (ref 0.61–1.24)
GFR calc Af Amer: 12 mL/min — ABNORMAL LOW (ref >60)
GFR calc non Af Amer: 11 mL/min — ABNORMAL LOW (ref >60)
Glucose, Bld: 157 mg/dL — ABNORMAL HIGH (ref 70–99)
Potassium: 4.1 mmol/L (ref 3.5–5.1)
Sodium: 145 mmol/L (ref 135–145)
Total Bilirubin: 0.8 mg/dL (ref 0.3–1.2)
Total Protein: 3.3 g/dL — ABNORMAL LOW (ref 6.5–8.1)

## 2018-09-11 LAB — POCT I-STAT EG7
Acid-Base Excess: 2 mmol/L (ref 0.0–2.0)
Bicarbonate: 32.2 mmol/L — ABNORMAL HIGH (ref 20.0–28.0)
Calcium, Ion: 0.85 mmol/L — CL (ref 1.15–1.40)
HCT: 27 % — ABNORMAL LOW (ref 39.0–52.0)
Hemoglobin: 9.2 g/dL — ABNORMAL LOW (ref 13.0–17.0)
O2 Saturation: 17 %
Patient temperature: 99.2
Potassium: 4.1 mmol/L (ref 3.5–5.1)
Sodium: 152 mmol/L — ABNORMAL HIGH (ref 135–145)
TCO2: 35 mmol/L — ABNORMAL HIGH (ref 22–32)
pCO2, Ven: 96.5 mmHg (ref 44.0–60.0)
pH, Ven: 7.133 — CL (ref 7.250–7.430)
pO2, Ven: 20 mmHg — CL (ref 32.0–45.0)

## 2018-09-11 LAB — BLOOD CULTURE ID PANEL (REFLEXED)

## 2018-09-11 LAB — CBC
HCT: 36.1 % — ABNORMAL LOW (ref 39.0–52.0)
Hemoglobin: 11.4 g/dL — ABNORMAL LOW (ref 13.0–17.0)
MCH: 31.8 pg (ref 26.0–34.0)
MCHC: 31.6 g/dL (ref 30.0–36.0)
MCV: 100.6 fL — ABNORMAL HIGH (ref 80.0–100.0)
Platelets: 29 10*3/uL — CL (ref 150–400)
RBC: 3.59 MIL/uL — ABNORMAL LOW (ref 4.22–5.81)
RDW: 13.1 % (ref 11.5–15.5)
WBC: 4.2 10*3/uL (ref 4.0–10.5)
nRBC: 1 % — ABNORMAL HIGH (ref 0.0–0.2)

## 2018-09-11 IMAGING — DX DG CHEST 1V PORT SAME DAY
1 series · 1 of 1 positions shown · non-contrast
Comparison: Single-view of the chest 10/25/2016.

CLINICAL DATA: Cough and chest pain. Status post cardiac
catheterization and coronary artery stenting 10/25/2016.

EXAM:
PORTABLE CHEST 1 VIEW

[chest ap]
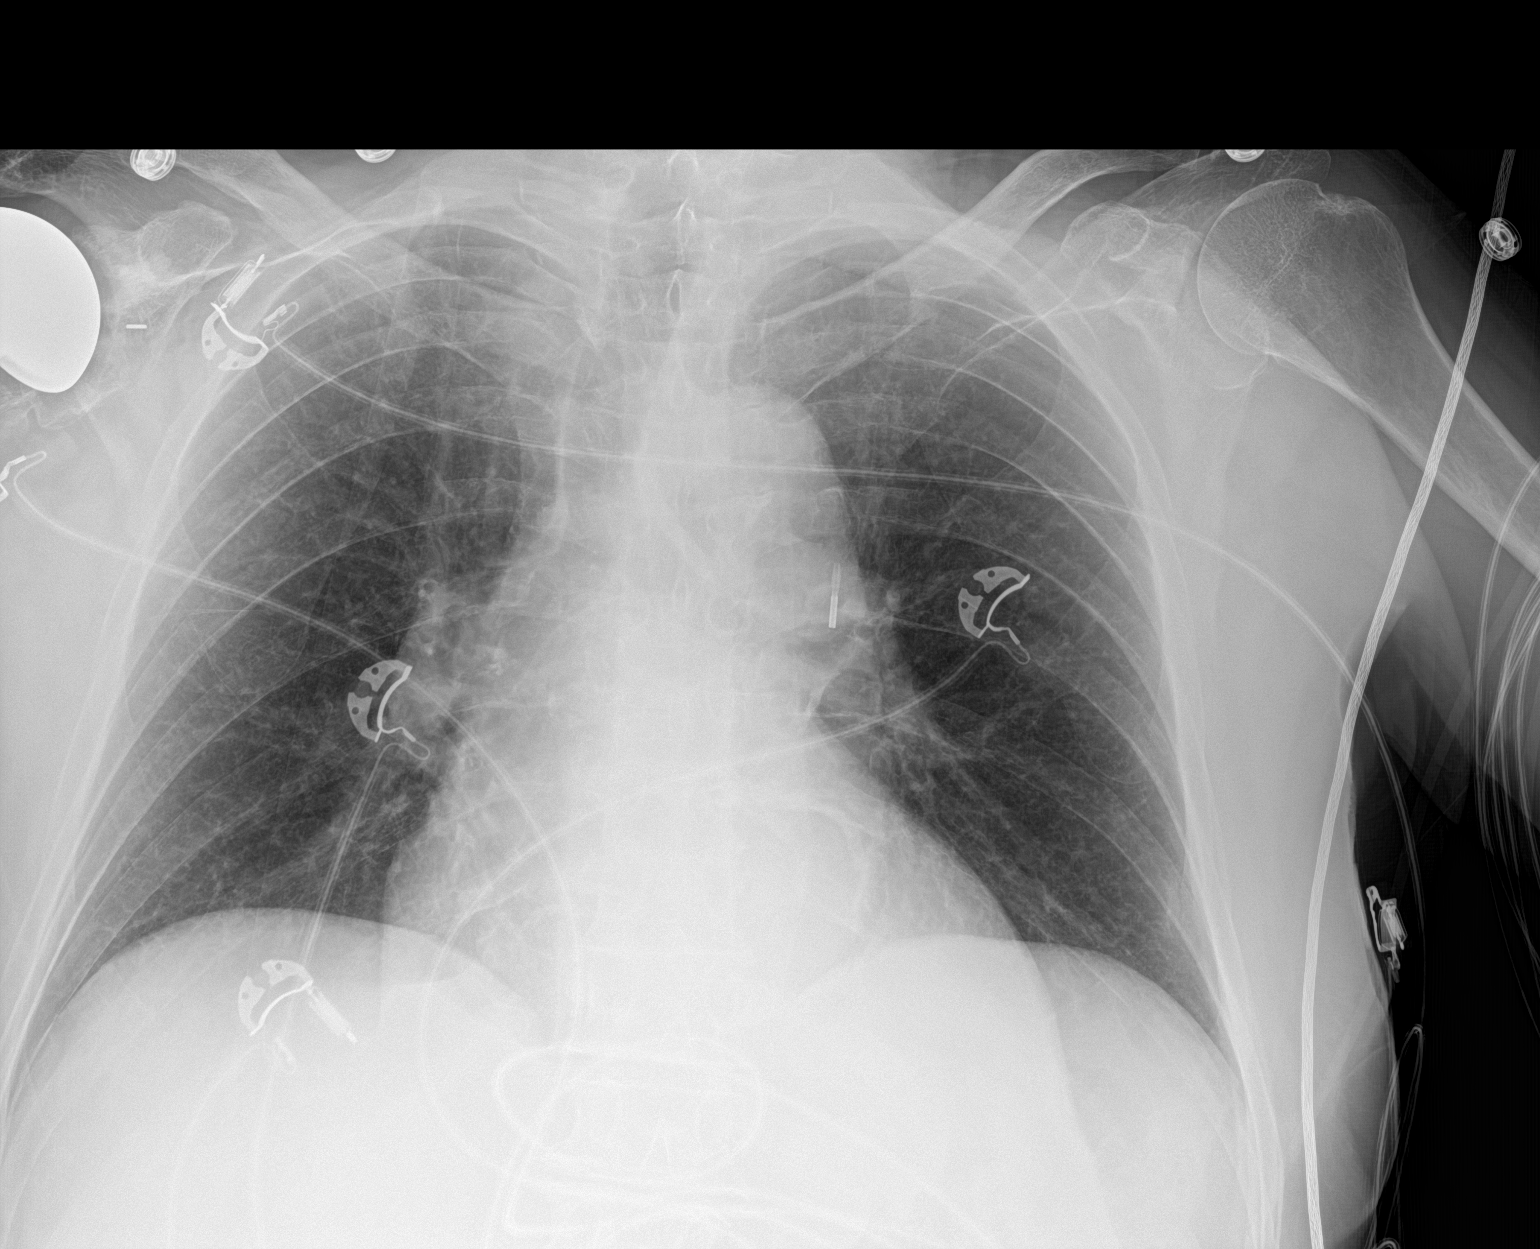

[1 of 1 positions shown; findings below may reference images not displayed]

FINDINGS: Intra-aortic balloon pump is again seen and unchanged in position.
The lungs are clear without edema. No pneumothorax or pleural
effusion. There is cardiomegaly. Atherosclerosis is noted.
IMPRESSION: No acute disease.

Intra-aortic balloon pump is unchanged in position.

## 2018-09-12 LAB — URINE CULTURE: Culture: NO GROWTH

## 2018-09-12 LAB — GLUCOSE, CAPILLARY: Glucose-Capillary: 19 mg/dL — CL (ref 70–99)

## 2018-09-12 MED FILL — Medication: Qty: 1 | Status: AC

## 2018-09-13 ENCOUNTER — Telehealth: Payer: Self-pay

## 2018-09-13 LAB — CULTURE, BLOOD (ROUTINE X 2): Special Requests: ADEQUATE

## 2018-09-13 NOTE — Telephone Encounter (Signed)
Received dc from [employer]  ([employer])   The dc is for cremation and a patient of Doctor Renee Pain..  DC will be taken to Pulmonary Unit for signature.  On 09/15/2018 Received dc back from Doctor Melvyn Novas who signed the dc for Doctor Carson Myrtle.  I called the funeral home to let them know the dc is ready for pickup and also faxed a copy to the funeral home per their request.

## 2018-09-21 NOTE — Significant Event (Signed)
DEATH NOTE  Called to see patient re: asystole on the monitor.  DNR. Unresponsive to noxious, tactile or verbal stimuli. No spontaneous respirations.  No breath sounds. No heart tones Pupils fixed and dilated.  No Doll's eyes.  No corneals.  Patient pronounced dead at 07-29-35 on 10/04/2018.  Case presented to Medical Examiner and was deferred.  Renee Pain, MD Board Certified by the ABIM, [place]

## 2018-09-21 NOTE — Procedures (Signed)
Central Venous Catheter Insertion Procedure Note Tommy Robbins [id number] 1945-09-12  Procedure: Insertion of Central Venous Catheter Indications: Assessment of intravascular volume, Drug and/or fluid administration and Frequent blood sampling  Procedure Details Consent: Unable to obtain consent because of emergent medical necessity. Time Out: Verified patient identification, verified procedure, site/side was marked, verified correct patient position, special equipment/implants available, medications/allergies/relevent history reviewed, required imaging and test results available.  Performed  Maximum sterile technique was used including antiseptics, cap, gloves, gown, hand hygiene, mask and sheet. Skin prep: Chlorhexidine; local anesthetic administered A antimicrobial bonded/coated triple lumen catheter was placed in the right internal jugular vein using the Seldinger technique.  Evaluation Blood flow good Complications: No apparent complications Patient did tolerate procedure well. Chest X-ray ordered to verify placement.  CXR: pending.  Renee Pain, MD Board Certified by the ABIM, Pulmonary Diseases & Critical Care Medicine   2018/09/30, 8:23 PM

## 2018-09-21 NOTE — Progress Notes (Signed)
Assisted tele visit to patient with provider.  Tarin Johndrow M, RN  

## 2018-09-21 NOTE — Progress Notes (Signed)
RT in to collect 2200 ABG at 2201. Attempted stick, unable to return blood. Doppler brought to room to better assess pulses. Pt then became pulseless, CPR started.

## 2018-09-21 NOTE — Progress Notes (Signed)
Critical ABG results called to Dr Lamonte Sakai at 1705. PH-7.18 PCO2- 45 PO2-398 HCO3-17.1  Kathie Dike RRT

## 2018-09-21 NOTE — Progress Notes (Deleted)
eLink Physician-Brief Progress Note Patient Name: Tommy Robbins DOB: 04/24/1945 MRN: [id number]   Date of Service  09/19/2018  HPI/Events of Note  CorTrak not functioning. Request to change per tube medications to another route.   eICU Interventions  Will change ASA to suppository. The other per tube medications can be held until the CorTrak is replaced in the AM.     Intervention Category Major Interventions: Other:  Lysle Dingwall 09-19-2018, 8:41 PM

## 2018-09-21 NOTE — Code Documentation (Addendum)
..  CODE BLUE NOTE  Patient Name: Tommy Robbins   MRN: [id number]   Date of Birth/ Sex: June 10, 1945 , male      Admission Date: September 12, 2018  Attending Provider: [name], MD  Primary Diagnosis: <principal problem not specified>    Indication:  73 yo M w/ PMHx sig for HFrEF w/ initial clinical impression of CHF exacerbation leadint to acute hypoxic resp failure.  Hypoglycemic and Mixed acidosis ( resp and metabolic). Started on Bicarb gtt and Levophed.  Became pulseless at 2213  Technical Description:  - CPR performance duration:  14 minutes  - Was defibrillation or cardioversion used? No   - Was external pacer placed? No  - Was patient intubated pre/post CPR? Yes    Medications Administered: Y = Yes; Blank = No Amiodarone    Atropine          1  Calcium    Epinephrine          4  Lidocaine    Magnesium           1  Norepinephrine    Phenylephrine    Sodium bicarbonate           3  Vasopressin      Post CPR evaluation:  - Final Status - Was patient successfully resuscitated ? Yes - What is current rhythm? Afib RVR Continues on Levophed gtt, Bicarb gtt IVF bolus was started prior to rpt CVP CVP at 17 fluid stopped - What is current hemodynamic status? tenuous   Miscellaneous Information:  - Labs sent, including: CBC, CMP, ABG, Coox  - Primary team notified?  Yes  - Family Notified? Yes  - Additional notes/ transfer status: Already in ICU.  ScVO2  79 prior Last EF 25-40%.    Kandice Hams, MD  2018-09-12, 10:36 PM

## 2018-09-21 NOTE — Progress Notes (Addendum)
OVERNIGHT COVERAGE CRITICAL CARE PROGRESS NOTE  CODE BLUE called.  Dr. Lucile Crater help appreciated.  ROSC after 14 min.  Again found to be hypoglycemic. D50 administered during ACLS.  Now, in atrial fibrillation with rapid ventricular response.  Hypotensive, on norepinephrine gtt.  Principal Problem:   Acute respiratory failure with hypoxia (HCC) Active Problems:   Ischemic cardiomyopathy   Acute renal failure superimposed on stage 3 chronic kidney disease (HCC)   Hypoglycemic shock (HCC)   Cardiogenic shock (HCC)   CAD (coronary artery disease), native coronary artery   Atrial fibrillation with rapid ventricular response (HCC)   Hypothyroidism   HYPERTENSION, BENIGN ESSENTIAL   COPD (chronic obstructive pulmonary disease) (HCC)  Add vasopressin gtt. Start D10 gtt. Repeat SvO2 79-->64%.  Venous blood gas. If BP fails to improve with correction of metabolic acidosis, may need to try digoxin for rate control or milrinone for inotropy. 12-lead EKG.  Sister Levander Campion) updated.  She plans to discuss goals of care with family and return call regarding code status.  Will remain FULL CODE for now.  Critical care time: 60 minutes.  The treatment and management of the patient's condition was required based on the threat of imminent deterioration. This time reflects time spent by the physician evaluating, providing care and managing the critically ill patient's care. The time was spent at the immediate bedside (or on the same floor/unit and dedicated to this patient's care). Time involved in separately billable procedures is NOT included int he critical care time indicated above. Family meeting and update time may be included above if and only if the patient is unable/incompetent to participate in clinical interview and/or decision making, and the discussion was necessary to determining treatment decisions.  Renee Pain, MD Board Certified by the ABIM, [place]

## 2018-09-21 NOTE — ED Notes (Signed)
I was unable to get blood for the blood cultures

## 2018-09-21 NOTE — ED Notes (Signed)
MD Pfeiffer notified of critical Troponin of 1.03

## 2018-09-21 NOTE — ED Triage Notes (Signed)
Pt arrives via EMS from home with reports of SOB worsening over the past few days. Fire reports O2 sats in 80s on RA, placed on NRB. Pt O2 on RA on arrival is 94%, lung sounds crackles.

## 2018-09-21 NOTE — Progress Notes (Signed)
Pt heart rate sinus tach 120-130 on arrival to unit around 16:30  Gave versed pushes and fentanyl (see MAR)  Pt heart rate sinus tach 145-150, @ 1800 and MD notified  Precedex started (see MAR)  No new orders given, will continue to monitor.

## 2018-09-21 NOTE — Progress Notes (Signed)
Fentanyl gtt d/c'd wasted 249mL fentanyl with Shea Stakes B.

## 2018-09-21 NOTE — Procedures (Signed)
Intubation Procedure Note Tommy Robbins [id number] 05/08/1945  Procedure: Intubation Indications: Respiratory insufficiency  Procedure Details Consent: Risks of procedure as well as the alternatives and risks of each were explained to the (patient/caregiver).  Consent for procedure obtained. Time Out: Verified patient identification, verified procedure, site/side was marked, verified correct patient position, special equipment/implants available, medications/allergies/relevent history reviewed, required imaging and test results available.  Performed  Maximum sterile technique was used including gloves, gown, hand hygiene and mask.   Meds: versed 41m, etomidate 248m 8.0 ETT placed under DL with Mac-4 glidescope. The pharynx was very dry. Placed on first pass, location confirmed by ETCO2, DL, auscultation.     Evaluation Hemodynamic Status: BP stable throughout; O2 sats: transiently fell during during procedure Patient's Current Condition: stable Complications: No apparent complications Patient did tolerate procedure well. Chest X-ray ordered to verify placement.  CXR: pending.   RoCollene Robbins/September 29, 2018

## 2018-09-21 NOTE — H&P (Signed)
NAME:  Tommy Robbins, MRN:  [id number], DOB:  Jul 16, 1945, LOS: 0 ADMISSION DATE:  09-18-2018,  CHIEF COMPLAINT: Acute hypoxemic respiratory failure  Brief History    History of present illness   73 year old man with a history of severe CAD and ischemic cardiomyopathy with ICD, prior VF arrest 10/2016 (required IABP), hypertension, hypothyroidism.  Also with history of remote IV drug use, hepatitis C which is currently being treated with Epclusa.  He presents with 3 to 4 days of progressive exertional dyspnea.  No known fevers or sick contacts although he was somewhat confused on questioning at presentation.  He was found to be hypoxemic with bilateral interstitial and alveolar infiltrates on chest x-ray, right greater than left (reviewed by me).  His niece is a PA in our emergency department, reports that he has had worsening of his chronic back pain for 4 days.  Labs reveal acute on chronic renal failure, elevated BNP, elevated troponin.  Lactic acid 4.2.  There is no leukocytosis, new significant thrombocytopenia, PLT 44.  In the emergency department he has received empiric Lasix, empiric antibiotics for possible pneumonia.  COVID 19 testing is negative and BiPAP was initiated.  Past Medical History   has a past medical history of AICD (automatic cardioverter/defibrillator) present (03/24/2017), Cataract, Cellulitis and abscess of right leg (10/06/2016), Chronic low back pain, Colon polyps, Depression, Diverticulosis, GERD (gastroesophageal reflux disease), Hepatitis C, History of sudden cardiac arrest (10/25/2016), substance abuse (HCC), Hypertension, Insomnia, and Thyroid disease.   Significant Hospital Events     Consults:  Cardiology  Procedures:  ETT 6/20 >>   Significant Diagnostic Tests:  CXR 6/20 >> patchy bilateral airspace disease, right greater than left   Micro Data:  SARS CoV2 6/20 >> negative Blood 6/20 >>   Antimicrobials:  Ceftriaxone 6/20 >>  Azithromycin 6/20 >>    Interim history/subjective:    Objective   Blood pressure (!) 105/92, pulse (!) 132, temperature 98.3 F (36.8 C), temperature source Oral, resp. rate (!) 22, height 5\' 10"  (1.778 m), weight 77.1 kg, SpO2 100 %.       No intake or output data in the 24 hours ending 09-18-18 1300 Filed Weights   18-Sep-2018 1021  Weight: 77.1 kg    Examination: General: Acute and chronically ill elderly man, moderate respiratory distress on BiPAP HENT: Edentulous, oropharynx moist Lungs: Coarse rhonchi bilaterally, no wheezing Cardiovascular: Tachycardic, regular, frequent PVCs by telemetry, no murmur Abdomen: Soft, nondistended, hypoactive bowel sounds Extremities: No significant lower extremity edema, chronic venous stasis changes Neuro: Awake, attempts to interact but difficult to understand.  Insight into his current situation unclear  Resolved Hospital Problem list     Assessment & Plan:  Acute respiratory failure with bilateral pulmonary infiltrates.  Most consistent with cardiogenic pulmonary edema in absence of leukocytosis, fever. COVID testing negative but syndrome could be consistent.  BiPAP initiated.  Based on his current evaluation and in absence of any significant improvement after diuretics I suspect he will require intubation and mechanical ventilation Sedation w fent + versed PRVC 8cc/kg. May consider dropping Vt if we get evidence that this is non-cardiogenic Diuresis as renal function, blood pressure will allow Empiric bronchodilators given tobacco history Low threshold to stop antibiotics given normal white count.  Check procalcitonin to help guide decision-making. Could consider PE given elevated d-dimer, back pain. Cannot perform CT-PA due to renal failure. May end up on heparin infusion for NSTEMI depending on troponin trend.   Ischemic cardiomyopathy, CAD with an AICD  in place NSTEMI, likely secondary to stress situation but consider primary MI.  ECG reviewed, no  significant ST changes seen (very subtle V3, V4) On Entresto, spironolactone, metoprolol, Imdur, ASA, Lipitor as an outpatient Lasix 40 mg given in the emergency department, continue plans to diurese depending on blood pressure and renal function. Consult cardiology, followed by the advanced heart failure clinic Consider heparin although note thrombocytopenia.  Plan to follow troponin and initiate if rising.  Echocardiogram   Tobacco use, presumed COPD Albuterol prn for now, assess for potential benefit scheduled DuoNeb  Acute on chronic renal failure. Per family has been taking NSAID around the clock last 3-4 days Follow BMP, urine output Careful with diuresis Hold Entresto  Thrombocytopenia.  Etiology unclear, consider meds, consider sepsis Follow CBC Consider DIC panel Continue aspirin for now but may need to hold if thrombocytopenia progresses  Hypothyroidism Continue home Synthroid dosing Check TSH  Acute on chronic back pain.  On MS Contin outpatient Try to defer Bellerose-acting narcotics at least temporarily to avoid respiratory suppression Question of the possible etiology.  With elevated d-dimer may need to consider empiric rx PE. Deferring for now since we have an alternative dx for his decompensation.     Best practice:  Diet: N.p.o. Pain/Anxiety/Delirium protocol (if indicated): Fentanyl, Versed as needed VAP protocol (if indicated): 6/20 DVT prophylaxis: Heparin GI prophylaxis: PPI Glucose control: Scale insulin Mobility: BR Code Status: Full code.  Patient states that he would be willing to be supported aggressively to try and reverse acute decompensation, would not want prolonged or indefinite ventilation or invasive support. Family Communication: spoke with the patient's niece Britni 6/20 Disposition: ICU  Labs   CBC: Recent Labs  Lab 06/30/18 1045 06/30/18 1048  WBC 3.7*  --   HGB 14.9 16.0  HCT 46.3 47.0  MCV 95.5  --   PLT 44*  --     Basic  Metabolic Panel: Recent Labs  Lab 06/30/18 1045 06/30/18 1048  NA 133* 132*  K 4.9 4.7  CL 98 102  CO2 16*  --   GLUCOSE 82 80  BUN 59* 54*  CREATININE 3.88* 3.50*  CALCIUM 8.0*  --    GFR: Estimated Creatinine Clearance: 19.7 mL/min (A) (by C-G formula based on SCr of 3.5 mg/dL (H)). Recent Labs  Lab 06/30/18 1045 06/30/18 1130  WBC 3.7*  --   LATICACIDVEN  --  4.2*    Liver Function Tests: No results for input(s): AST, ALT, ALKPHOS, BILITOT, PROT, ALBUMIN in the last 168 hours. No results for input(s): LIPASE, AMYLASE in the last 168 hours. No results for input(s): AMMONIA in the last 168 hours.  ABG    Component Value Date/Time   TCO2 19 (L) 06/30/2018 1048     Coagulation Profile: No results for input(s): INR, PROTIME in the last 168 hours.  Cardiac Enzymes: Recent Labs  Lab 06/30/18 1045  TROPONINI 1.03*    HbA1C: No results found for: HGBA1C  CBG: No results for input(s): GLUCAP in the last 168 hours.  Review of Systems:   Complains of back pain, SOB, occasional cough non-productive   Past Medical History  He,  has a past medical history of AICD (automatic cardioverter/defibrillator) present (03/24/2017), Cataract, Cellulitis and abscess of right leg (10/06/2016), Chronic low back pain, Colon polyps, Depression, Diverticulosis, GERD (gastroesophageal reflux disease), Hepatitis C, History of sudden cardiac arrest (10/25/2016), substance abuse (HCC), Hypertension, Insomnia, and Thyroid disease.   Surgical History    Past Surgical History:  Procedure Laterality Date  . CARDIAC CATHETERIZATION    . CARDIAC DEFIBRILLATOR PLACEMENT  03/24/2017  . IABP INSERTION N/A 10/24/2016   Procedure: IABP Insertion;  Surgeon: Lorretta Harp, MD;  Location: [facility];  Service: Cardiovascular;  Laterality: N/A;  . ICD IMPLANT N/A 03/24/2017   MDT [place] MRI XT DR ICD implanted by Dr Rayann Heman for secondary prevention (VF arrest)  . JOINT REPLACEMENT    .  LEFT HEART CATH AND CORONARY ANGIOGRAPHY N/A 10/24/2016   Procedure: LEFT HEART CATH AND CORONARY ANGIOGRAPHY;  Surgeon: Lorretta Harp, MD;  Location: [facility];  Service: Cardiovascular;  Laterality: N/A;  . TOTAL SHOULDER REPLACEMENT Right 03/23/2008     Social History   reports that he quit smoking about 16 months ago. His smoking use included cigarettes. He quit after 53.00 years of use. He has never used smokeless tobacco. He reports current alcohol use of about 5.0 standard drinks of alcohol per week. He reports current drug use. Drug: Marijuana.   Family History   His family history includes Arrhythmia in his sister; Arthritis in his father and sister; Arthritis/Rheumatoid in his sister; CAD in his brother; Cancer in his sister; Hypertension in his sister; Osteoarthritis in his mother; Peripheral vascular disease in his sister; Scoliosis in his sister.   Allergies Allergies  Allergen Reactions  . Other Other (See Comments)    Seasonal allergies - sniffling      Home Medications  Prior to Admission medications   Medication Sig Start Date End Date Taking? Authorizing Provider  aspirin EC 81 MG tablet Take 81 mg by mouth daily.    [provider]  atorvastatin (LIPITOR) 80 MG tablet TAKE 1 TABLET DAILY AT [facility] 08/23/18   Alycia Rossetti, MD  cycloSPORINE (RESTASIS) 0.05 % ophthalmic emulsion Place 1 drop into both eyes 2 (two) times daily.    [provider]  diclofenac sodium (VOLTAREN) 1 % GEL Apply 1 application topically daily as needed (for pain).    [provider]  ENTRESTO 24-26 MG TAKE 1 TABLET BY MOUTH TWICE A DAY 06/21/18   Bensimhon, Shaune Pascal, MD  furosemide (LASIX) 20 MG tablet Take 1 tablet (20 mg total) by mouth daily as needed. Patient taking differently: Take 20 mg by mouth daily as needed for fluid or edema.  01/20/17 11/11/18  Almyra Deforest, PA  gabapentin (NEURONTIN) 100 MG capsule Take 200 mg by mouth at bedtime. Every other night.  03/03/17   [provider]  isosorbide mononitrate (IMDUR) 30 MG 24 hr tablet TAKE 1 TABLET BY MOUTH EVERY DAY Patient taking differently: Take 30 mg by mouth daily.  12/20/17   Almyra Deforest, PA  ketoconazole (NIZORAL) 2 % shampoo APPLY TOPICALLY 2 TIMES A WEEK. 07/28/18   Alycia Rossetti, MD  lidocaine (LIDODERM) 5 % Place 3 patches onto the skin daily as needed (pain).  10/17/14   [provider]  metoprolol succinate (TOPROL-XL) 50 MG 24 hr tablet Take one tablet daily with food 12/06/17   Lorretta Harp, MD  morphine (MS CONTIN) 15 MG 12 hr tablet Take 15 mg by mouth See admin instructions. Take 15 mg by mouth in the morning with 30 mg to equal 45 mg dose 06/30/15   [provider]  morphine (MS CONTIN) 30 MG 12 hr tablet Take 30 mg by mouth See admin instructions. Takes 30 mg by mouth in the morning with 15 mg to equal 45 mg dose, 30 mg  in the afternoon, and 30 mg at night    [provider]  mupirocin ointment (BACTROBAN) 2 % Apply 1 application topically 2 (two) times daily. 06/01/18   Danelle Berryapia, Leisa, PA-C  nitroGLYCERIN (NITROSTAT) 0.4 MG SL tablet Place 1 tablet (0.4 mg total) under the tongue every 5 (five) minutes as needed for chest pain. 01/20/17   Azalee CourseMeng, Hao, PA  Nutritional Supplements (JUICE PLUS FIBRE PO) Take 2 capsules by mouth 2 (two) times daily.    [provider]  spironolactone (ALDACTONE) 25 MG tablet TAKE 1 TABLET DAILY 05/16/18   [place], Velna HatchetKawanta F, MD  SYNTHROID 88 MCG tablet TAKE 1 TABLET DAILY BEFORE BREAKFAST Patient taking differently: Take 88 mcg by mouth daily before breakfast.  04/04/18   [place], Velna HatchetKawanta F, MD  temazepam (RESTORIL) 15 MG capsule Take 1 capsule (15 mg total) by mouth at bedtime as needed for sleep. Patient taking differently: Take 15 mg by mouth at bedtime as needed for sleep. Every other night 07/06/18   Salley Scarleturham, Kawanta F, MD  traZODone (DESYREL) 50 MG tablet Take 25 mg by mouth at bedtime.  09/02/16   [provider]  varenicline (CHANTIX STARTING MONTH PAK) 0.5 MG X 11 & 1 MG X 42 tablet Take one 0.5 mg tablet by mouth once daily for 3 days, then increase to one 0.5 mg tablet twice daily for 4 days, then increase to one 1 mg tablet twice daily. 04/15/18   Salley Scarleturham, Kawanta F, MD     Critical care time: 5260     Levy Pupaobert , MD, PhD 05/15/2018, 3:00 PM [facility] [phone] or if no answer [phone]

## 2018-09-21 NOTE — Progress Notes (Signed)
OVERNIGHT COVERAGE CRITICAL CARE PROGRESS NOTE  CTSP re: hypotension/shock.  BP 65/15.  Phenylphrine was ordered by e-ICU, started just after clinical encounter.  Patient is intubated.  Not on sedation.  RR 30 with PRVC f 28, VT 580, FiO2 50%, PEEP 10.  Patient was admitted earlier today with a clinical impression of CHF exacerbation as the primary cause for acute hypoxemic respiratory acidosis.  Due to concerns of hypotension, PEEP was decreased to 5 at the bedside.  ABG obtained at bedside:  ABG    Component Value Date/Time   PHART 7.096 (LL) 19-Sep-2018 1950   PCO2ART 34.1 19-Sep-2018 1950   PO2ART 104 Sep 19, 2018 1950   HCO3 10.0 (L) September 19, 2018 1950   TCO2 18 (L) September 19, 2018 1659   ACIDBASEDEF 17.8 (H) 2018/09/19 1950   O2SAT 95.7 09-19-2018 1950   Based on 2D echo (10/06/2017): LVEF 35-40% with wall motion abnormalities (predominantly apical), grade 1 diastolic dysfunction.  Lab Results  Component Value Date   BNP 1,191.9 (H) 2018-09-19   TROPONINI 4.38 (HH) 19-Sep-2018   Lab Results  Component Value Date   ANIONGAP 23 (H) September 19, 2018   LATICACIDVEN 6.5 (HH) 09-19-2018   CREATININE 4.26 (H) 2018/09/19   Shock, NOS  CVC placement.  Titrate phenylephrine for MAP 65+.  Check CVP and SvO2.  Leukopenia may be indicative of Gram negative septic process.  Trach aspirate from Gram stain, C/S.  Continue Rocephin/azithromycin for now.  Check glucose.  Sodium bicarbonate 100 mEq IV push and start D5W + 3 amps NB @ 75 mL/hr.  Check random cortisol.  Restart Synthroid.  Critical care time: 60 minutes.  The treatment and management of the patient's condition was required based on the threat of imminent deterioration. This time reflects time spent by the physician evaluating, providing care and managing the critically ill patient's care. The time was spent at the immediate bedside (or on the same floor/unit and dedicated to this patient's care). Time involved in separately billable  procedures is NOT included int he critical care time indicated above. Family meeting and update time may be included above if and only if the patient is unable/incompetent to participate in clinical interview and/or decision making, and the discussion was necessary to determining treatment decisions.  Renee Pain, MD Board Certified by the ABIM, [place]

## 2018-09-21 NOTE — Progress Notes (Signed)
Patient time of death 2337. Verified by 2 RN"s and Dr. Carson Myrtle at bedside. Patient's family had him made a DNR during second attempt at resuscitation.   Milford Cage, RN

## 2018-09-21 NOTE — Code Documentation (Addendum)
BP was 67/50. Critical Care gave verbal order for a 250 bolus. NS bolus administered to pt.

## 2018-09-21 NOTE — Progress Notes (Signed)
CRITICAL VALUE ALERT  Critical Value:  Lactic 6.55  Date & Time Notied:  10-07-2018 6:01 PM   Provider Notified: Dr Lamonte Sakai MD

## 2018-09-21 NOTE — Progress Notes (Signed)
eLink Physician-Brief Progress Note Patient Name: Tommy Robbins DOB: February 02, 1946 MRN: [id number]   Date of Service  10/05/2018  HPI/Events of Note  Multiple issues: 1. ABG at 5 PM with pH = 7.184 and no follow up ABG, 2. Hypotension - BP = 81/57 and 3. Lactic Acid = 6.5. Hgb = 16.0.  eICU Interventions  Will order: 1. Phenylephrine IV infusion. Titrate to MAP > 65. 2. Continue to trend Lactic Acid with improved BP. 3. Follow up ABG now. 4. Will ask ground team to place CVL to monitor CVP, trend COOX and infuse Vasopressors.       Intervention Category Major Interventions: Other:;Acid-Base disturbance - evaluation and management;Hypotension - evaluation and management;Respiratory failure - evaluation and management  Lysle Dingwall Oct 05, 2018, 7:33 PM

## 2018-09-21 NOTE — Consult Note (Signed)
Cardiology Consultation:   Due to the COVID-19 pandemic, this visit was completed with telemedicine (audio/video) technology to reduce patient and provider exposure as well as to preserve personal protective equipment.   Patient ID: Tommy Robbins MRN: [id number]; DOB: 10-12-1945  Admit date: 09-23-18 Date of Consult: 09/23/2018  Primary Care Provider: Salley Scarlet, MD Primary Cardiologist: Nanetta Batty, MD/ Dr. Gala Romney Primary Electrophysiologist:  Hillis Range, MD    Patient Profile:   Tommy Robbins is a 73 y.o. 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 Delton Coombes, PCCM.   History of Present Illness:   [name] is a 73 y/o male with h/o Vfib arrest 10/2016 requiring IABP, Severe 3 v CAD with LAD CTO and no targets for CABG,  Hepatitis C, distant h/o IV drug abuse, s/p ICD Opti vol. Primary followed by Dr. Allyson Sabal. Was referred to the [facility] in 2019 but was deemed by Dr. Gala Romney not to be a candidate for advanced therapies.  Has been treated w/ Entresto, Troprol XL and spironolactone. Dr. Johney Frame follows his ICD. This is a [employer] device.   He was admitted back in January for acute cholecystitis. He was seen by Dr. Tenny Craw for preoperative assessment and felt to be high risk for cardiac complications and she recommended non surgical approach to treatment if possible. In addition, his HIDA scan turned out to be negative and a conservative approached was followed through.   Pt presented to the [facility] today via EMS for acute hypoxic respiratory failure. Pt called EMS for dyspnea, which has been worsening over the past few days. He was found to have O2 sats in the 80s on RA and placed on NRB. On arrival to the ED, his O2 sats were 95% and crackles were noted on auscultation.  CXR w/ patchy bilateral airspace process over the mid to upper lungs, right worse than left. ? likely multifocal pneumonia. Labs c/w acute renal failure w/ SCr at 3.88 (baseline 0.8-1.1), elevated BNP at 546 and elevated troponin at 1.03 (was 17.76 when he had his VF arrest in 2018). D-dimer is >20!! Lactic acid also elevated at 4.2. WBC ct WNL. Afebrile. Variable HRs. Some bradycardia and tachycardia. Pulse rate from 32>>132. EKGs show sinus bradycardia w/ HR in the 50s and occasional PVCs. Initial COVID test negative, however per Dr. Delton Coombes w/ PCCM, pt may require retest. He is being intubated in the ED w/ Plans to admit to the ICU. He will be given a dose of IV Lasix and started on empiric antibiotics for possible PNA. Dr. Delton Coombes has asked cardiology to assist with CHF.   Past Medical History:  Diagnosis Date  . AICD (automatic cardioverter/defibrillator) present 03/24/2017  . Cataract   . Cellulitis and abscess of right leg 10/06/2016   right thigh  . Chronic low back pain   . Colon polyps   . Depression   . Diverticulosis   . GERD (gastroesophageal reflux disease)   . Hepatitis C   . History of sudden cardiac arrest 10/25/2016   witness/notes 02/10/2017  . Hx of substance abuse (HCC)    Tommy Robbins 02/10/2017  . Hypertension   . Insomnia   . Thyroid disease    hypothyroid  Past Surgical History:  Procedure Laterality Date  . CARDIAC CATHETERIZATION    . CARDIAC DEFIBRILLATOR PLACEMENT  03/24/2017  . IABP INSERTION N/A 10/24/2016   Procedure: IABP Insertion;  Surgeon: Runell Gess, MD;  Location: [facility] INVASIVE CV LAB;  Service: Cardiovascular;  Laterality: N/A;  . ICD IMPLANT N/A 03/24/2017   MDT [place] MRI XT DR ICD implanted by Dr Johney Frame for secondary prevention (VF arrest)  . JOINT REPLACEMENT    . LEFT HEART CATH AND CORONARY ANGIOGRAPHY N/A 10/24/2016   Procedure: LEFT HEART CATH AND CORONARY ANGIOGRAPHY;  Surgeon: Runell Gess, MD;  Location: [facility] INVASIVE CV LAB;  Service:  Cardiovascular;  Laterality: N/A;  . TOTAL SHOULDER REPLACEMENT Right 03/23/2008     Home Medications:  Prior to Admission medications   Medication Sig Start Date End Date Taking? Authorizing Provider  aspirin EC 81 MG tablet Take 81 mg by mouth daily.   Yes [provider]  atorvastatin (LIPITOR) 80 MG tablet TAKE 1 TABLET DAILY AT 6PM 08/23/18  Yes [place], Velna Hatchet, MD  cycloSPORINE (RESTASIS) 0.05 % ophthalmic emulsion Place 1 drop into both eyes 2 (two) times daily.   Yes [provider]  diclofenac sodium (VOLTAREN) 1 % GEL Apply 1 application topically daily as needed (for pain).   Yes [provider]  ENTRESTO 24-26 MG TAKE 1 TABLET BY MOUTH TWICE A DAY 06/21/18  Yes Bensimhon, Bevelyn Buckles, MD  furosemide (LASIX) 20 MG tablet Take 1 tablet (20 mg total) by mouth daily as needed. Patient taking differently: Take 20 mg by mouth daily as needed for fluid or edema.  01/20/17 11/11/18 Yes Azalee Course, PA  isosorbide mononitrate (IMDUR) 30 MG 24 hr tablet TAKE 1 TABLET BY MOUTH EVERY DAY Patient taking differently: Take 30 mg by mouth daily.  12/20/17  Yes Meng, Wynema Birch, PA  ketoconazole (NIZORAL) 2 % shampoo APPLY TOPICALLY 2 TIMES A WEEK. 07/28/18  Yes Taylor, Velna Hatchet, MD  lidocaine (LIDODERM) 5 % Place 3 patches onto the skin daily as needed (pain).  10/17/14  Yes [provider]  metoprolol succinate (TOPROL-XL) 50 MG 24 hr tablet Take one tablet daily with food 12/06/17  Yes Runell Gess, MD  morphine (MS CONTIN) 15 MG 12 hr tablet Take 15 mg by mouth See admin instructions. Take 15 mg by mouth in the morning with 30 mg to equal 45 mg dose 06/30/15  Yes [provider]  morphine (MS CONTIN) 30 MG 12 hr tablet Take 30 mg by mouth See admin instructions. Takes 30 mg by mouth in the morning with 15 mg to equal 45 mg dose, 30 mg in the afternoon, and 30 mg at night   Yes [provider]  Nutritional Supplements (JUICE PLUS FIBRE PO) Take 2 capsules by  mouth 2 (two) times daily.   Yes [provider]  pantoprazole (PROTONIX) 40 MG tablet Take 40 mg by mouth daily.   Yes [provider]  spironolactone (ALDACTONE) 25 MG tablet TAKE 1 TABLET DAILY 05/16/18  Yes [place], Velna Hatchet, MD  SYNTHROID 88 MCG tablet TAKE 1 TABLET DAILY BEFORE BREAKFAST Patient taking differently: Take 88 mcg by mouth daily before breakfast.  04/04/18  Yes , Velna Hatchet, MD  temazepam (RESTORIL) 15 MG capsule Take 1 capsule (15 mg total) by mouth at bedtime as needed for sleep. Patient taking differently: Take 15 mg by mouth at bedtime as needed for sleep. Every other night 07/06/18  Yes , Velna Hatchet, MD  traZODone (DESYREL) 50 MG tablet Take 25 mg by mouth at bedtime.  09/02/16  Yes [provider]  gabapentin (NEURONTIN) 100 MG capsule Take 200 mg by mouth at bedtime. Every other night. 03/03/17   [provider]  mupirocin ointment (BACTROBAN) 2 % Apply 1 application topically 2 (two) times daily. Patient not taking: Reported on 2018/09/24 06/01/18   Delsa Grana, PA-C  nitroGLYCERIN (NITROSTAT) 0.4 MG SL tablet Place 1 tablet (0.4 mg total) under the tongue every 5 (five) minutes as needed for chest pain. 01/20/17   Almyra Deforest, PA  varenicline (CHANTIX STARTING MONTH PAK) 0.5 MG X 11 & 1 MG X 42 tablet Take one 0.5 mg tablet by mouth once daily for 3 days, then increase to one 0.5 mg tablet twice daily for 4 days, then increase to one 1 mg tablet twice daily. Patient not taking: Reported on Sep 24, 2018 04/15/18   Alycia Rossetti, MD    Inpatient Medications: Scheduled Meds: . midazolam  2 mg Intravenous Once   Continuous Infusions: . fentaNYL infusion INTRAVENOUS 50 mcg/hr (Sep 24, 2018 1442)  . propofol     PRN Meds: etomidate, midazolam  Allergies:    Allergies  Allergen Reactions  . Other Other (See Comments)    Seasonal allergies - sniffling     Social History:   Social History   Socioeconomic History  . Marital  status: Divorced    Spouse name: Not on file  . Number of children: 3  . Years of education: 67  . Highest education level: Not on file  Occupational History  . Occupation: retired  Scientific laboratory technician  . Financial resource strain: Not hard at all  . Food insecurity    Worry: Never true    Inability: Never true  . Transportation needs    Medical: No    Non-medical: No  Tobacco Use  . Smoking status: Former Smoker    Years: 53.00    Types: Cigarettes    Quit date: 04/19/2017    Years since quitting: 1.3  . Smokeless tobacco: Never Used  Substance and Sexual Activity  . Alcohol use: Yes    Alcohol/week: 5.0 standard drinks    Types: 5 Cans of beer per week  . Drug use: Yes    Types: Marijuana    Comment: h/o IVDA; "no IVDAsince the 1970s; last smoked pot ~ 10/2016"  . Sexual activity: Not Currently  Lifestyle  . Physical activity    Days per week: Not on file    Minutes per session: Not on file  . Stress: Not on file  Relationships  . Social Herbalist on phone: Not on file    Gets together: Not on file    Attends religious service: Not on file    Active member of club or organization: Not on file    Attends meetings of clubs or organizations: Not on file    Relationship status: Not on file  . Intimate partner violence    Fear of current or ex partner: Not on file    Emotionally abused: Not on file    Physically abused: Not on file    Forced sexual activity: Not on file  Other Topics Concern  . Not on file  Social History Narrative   Entered 12/2013:   Lives with his Sister, Niece, and Mother   They take turns caring for mother, who is 67   Quit smoking in 2013    Family History:    Family  History  Problem Relation Age of Onset  . Arthritis Father        rheumatoid  . Hypertension Sister   . Cancer Sister   . Scoliosis Sister   . Arthritis Sister   . Osteoarthritis Mother   . Arthritis/Rheumatoid Sister   . Peripheral vascular disease Sister   .  Arrhythmia Sister   . CAD Brother      ROS:  Please see the history of present illness.   All other ROS reviewed and negative.     Physical Exam/Data:   Vitals:   20-Jun-2018 1245 20-Jun-2018 1251 20-Jun-2018 1324 20-Jun-2018 1400  BP: (!) 105/92   110/84  Pulse: (!) 125 (!) 132 (!) 116 (!) 32  Resp: (!) 36 (!) 22 (!) 33 (!) 32  Temp:      TempSrc:      SpO2: 99% 100% 100% 97%  Weight:      Height:       No intake or output data in the 24 hours ending 20-Jun-2018 1449 Last 3 Weights February 13, 2019 07/25/2018 06/01/2018  Weight (lbs) 170 lb 172 lb 3.2 oz 171 lb  Weight (kg) 77.111 kg 78.109 kg 77.565 kg     Body mass index is 24.39 kg/m.   Physical Exam: MD to assess via video, given concerns for possible covid 19. See MD portion of note for exam findings.   EKG:  The EKG was personally reviewed and demonstrates:  Sinus bradycardia 50s, occasional PVCs Telemetry:   MD to review. Please see MD portion of note.   Relevant CV Studies: Echo 10/06/17 Study Conclusions  - Left ventricle: The cavity size was normal. There was mild   concentric hypertrophy. Systolic function was moderately reduced.   The estimated ejection fraction was in the range of 35% to 40%.   Doppler parameters are consistent with abnormal left ventricular   relaxation (grade 1 diastolic dysfunction). - Regional wall motion abnormality: Hypokinesis of the apical   anterior, mid anteroseptal, apical inferior, mid inferolateral,   apical septal, apical lateral, and apical myocardium. - Aortic valve: Sclerosis without stenosis. There was no   regurgitation. - Ascending aorta: The ascending aorta was at the upper limits of   normal. - Mitral valve: Calcified annulus. There was mild regurgitation. - Left atrium: The atrium was moderately dilated. - Right ventricle: Systolic function was low normal. - Atrial septum: No defect or patent foramen ovale was identified. - Tricuspid valve: There was mild regurgitation. Peak RV-RA    gradient (S): 25 mm Hg. - Pulmonic valve: There was no significant regurgitation. - Pulmonary arteries: Systolic pressure was within the normal   range. PA peak pressure: 28 mm Hg (S).  Impressions:  - Moderate to severe reduction in LV EF with wall motion   abnormalities above, similar to prior.  LHC 10/2016 in setting of VF arrest  - Prox RCA to Mid RCA lesion, 100 %stenosed.  Ost LAD to Prox LAD lesion, 95 %stenosed.  Mid LAD lesion, 100 %stenosed.  Ost 2nd Diag lesion, 95 %stenosed.  Ost 1st Mrg to 1st Mrg lesion, 99 %stenosed.  2nd Mrg lesion, 99 %stenosed.  Ost Cx to Prox Cx lesion, 60 %stenosed.  Mid Cx to Dist Cx lesion, 99 %stenosed.  The left ventricular ejection fraction is 35-45% by visual estimate.  There is moderate to severe left ventricular systolic dysfunction.  LV end diastolic pressure is low. Coronary Diagrams  Diagnostic Dominance: Right  Intervention- non diagnostic only    Laboratory Data:  Chemistry Recent Labs  Lab November 08, 2018 1045 November 08, 2018 1048  NA 133* 132*  K 4.9 4.7  CL 98 102  CO2 16*  --   GLUCOSE 82 80  BUN 59* 54*  CREATININE 3.88* 3.50*  CALCIUM 8.0*  --   GFRNONAA 15*  --   GFRAA 17*  --   ANIONGAP 19*  --     No results for input(s): PROT, ALBUMIN, AST, ALT, ALKPHOS, BILITOT in the last 168 hours. Hematology Recent Labs  Lab November 08, 2018 1045 November 08, 2018 1048  WBC 3.7*  --   RBC 4.85  --   HGB 14.9 16.0  HCT 46.3 47.0  MCV 95.5  --   MCH 30.7  --   MCHC 32.2  --   RDW 13.0  --   PLT 44*  --    Cardiac Enzymes Recent Labs  Lab November 08, 2018 1045  TROPONINI 1.03*   No results for input(s): TROPIPOC in the last 168 hours.  BNP Recent Labs  Lab November 08, 2018 1045  BNP 546.7*    DDimer  Recent Labs  Lab November 08, 2018 1045  DDIMER >20.00*    Radiology/Studies:  Dg Chest Portable 1 View  Result Date: 06/28/18 CLINICAL DATA:  Worsening shortness of breath over the past 3 days. EXAM: PORTABLE CHEST 1 VIEW  COMPARISON:  04/09/2018 FINDINGS: Left-sided pacemaker unchanged. Lungs are somewhat hypoinflated demonstrate patchy airspace opacification over the mid to upper lungs right worse than left. No effusion. Cardiomediastinal silhouette and remainder of the exam is unchanged. IMPRESSION: Patchy bilateral airspace process over the mid to upper lungs right worse than left likely multifocal pneumonia. Electronically Signed   By: Elberta Fortisaniel  Boyle M.D.   On: August 18, 202020 11:51    @[email or web address] @   Assessment and Plan:   1. Acute Hypoxic Respiratory Failure: requiring intubation. Vent management per PCCM. Initial test for COVID 19 is negative. Based on CXR there are concerns for bilateral PNA. Empiric antibiotics per primary team. Elevated BNP suggest acute CHF may also be contributing. BNP 546.7. He has received IV lasix in the ED, 40 mg. Will monitor response. Strict I/Os. Aggressive diuresis may be difficult due to AKI. Also significantly elevated D-dimer at >20. Given hypoxia, consider PE w/u. Unable to get CT scan due to AKI. ? V/Q scan. Will defer to PCCM. Also ? IV heparin given his elevated troponin + d dimer as well.   2. Acute on Chronic Systolic CHF: last assessment of EF was by echo in July 2019. Ef was 35-40%. BNP today elevated at 546.7. He just got a dose of IV Lasix, 40 mg, in the ED. Will monitor response. Strict I/Os. Aggressive diuresis may be difficult due to AKI. Monitor renal function. Will give additional lasix, if renal function allows. If unable to diuresis w/ IV diuretics due to worsening renal function, will need to nephrology consult and consider CRRT. Also ? Cardiorenal syndrome and trial of milrinone. Repeat echo pending.   3. AKI: Scr 3.88 initially>3.50. Baseline SCr 0.8>>1.1. monitor closely. See above. May need renal consult.   4. Elevated Troponin: 1.03 (was 17.76 when he had his VF arrest in 2018). Likely demand ischemia from acute hypoxic respiratory failure, CHF,  Hypertensive emergency and AKI. Has known CAD. Cycle troponins but not a candidate for cardiac cath currently given AKI. Repeat echo pending. Will reassess LVF and check for new WMAs. ? Initiation of IV heparin given troponin and d-dimer elevation > 20.   5. 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 EKG non acute. Per above, cycle troponin's, get repeat echo.    MD to follow with further recommendations.    For questions or updates, please contact [facility] Please consult [email or web address] for contact info under     Signed, Robbie LisBrittainy Simmons, PA-C  09-26-18 2:49 PM

## 2018-09-21 NOTE — Significant Event (Signed)
CODE BLUE (#2)   Patient was hypotensive and in atrial fibrillation with rapid ventricular response.  Developed brady arrythmia and then PEA arrest.  Chest compressions started.  1 amp epinephrine administered along with 3 amps sodium bicarbonate.  ROSC in 2 minutes.  Of note, the patient's sister was called while ACLS/CPR was in progress.  She instructed that CPR efforts should stop.  CODE STATUS was changed to DNR per her instruction.  Renee Pain, MD Board Certified by the [employer], [place]

## 2018-09-21 NOTE — Death Summary Note (Signed)
DEATH SUMMARY   Patient Details  Name: Tommy MinerRichard M Maranan MRN: [id number] DOB: 08/12/1945  Admission/Discharge Information   Admit Date:  05-Aug-2018  Date of Death:  05-Aug-2018  Time of Death:  2337  Length of Stay: 0  Referring Physician: Salley Scarleturham, Kawanta F, MD   Reason(s) for Hospitalization  acute hypoxemic respiratory failure  Diagnoses  Preliminary cause of death:  Secondary Diagnoses (including complications and co-morbidities):  Principal Problem:   Acute respiratory failure with hypoxia (HCC) Active Problems:   Ischemic cardiomyopathy   Acute renal failure superimposed on stage 3 chronic kidney disease (HCC)   Hypoglycemic shock (HCC)   Cardiogenic shock (HCC)   CAD (coronary artery disease), native coronary artery   Atrial fibrillation with rapid ventricular response (HCC)   Hypothyroidism   HYPERTENSION, BENIGN ESSENTIAL   COPD (chronic obstructive pulmonary disease) [facility](HCC)   Brief Hospital Course (including significant findings, care, treatment, and services provided and events leading to death)  Tommy Robbins is a 73 y.o. year old male who [facility] to [facility] on 05-Aug-2018 with complaints of progressively worsening dyspnea over 3-4 days prior to presentation.  He was somewhat confused during his initial clinical interview.  Laboratory evaluation suggested acute on chronic renal failure, lactic acidosis and elevated troponin and BNP levels.  Chest x-ray showed bilateral alveolar and interstitial infiltrates.  He was initiated on treatment for a working diagnosis of acute hypoxemic respiratory failure, suspected to be due to CHF exacerbation related to NSTEMI with underlying ischemic cardiomyopathy.  He was also empirically treated with antibiotics for possible community acquired pneumonia.  He failed BiPAP support in the Emergency Department and required endotracheal intubation with mechanical ventilatory support.  He was admitted to the ICU  (2M05). While his oxygenation improved, he continued to have worsening metabolic acidosis (multifactorial: lactic + uremic), for which he was unable to achieve respiratory compensation.  Despite supportive measures, his acidosis worsened.  He experienced PEA arrest twice.  The patient's sister Chales Salmon(Dianne Henderly) was updated with the complications during treatment, and she instructed us to cease CPR efforts and change the patient's CODE STATUS to DNR during the second round of CPR.  The patient was pronounced dead at 2337 on 05-Aug-2018.    Pertinent Labs and Studies  Significant Diagnostic Studies Dg Chest Port 1 View  Result Date: 05-Aug-2018 CLINICAL DATA:  Central line EXAM: PORTABLE CHEST 1 VIEW COMPARISON:  015-May-2020, 04/09/2018, CT 04/09/2018 FINDINGS: Endotracheal tube tip is about 2 cm superior to the carina. Right IJ central venous catheter tip is partially obscured by cardiac pacing leads. Tip may be within the right atrium. No pneumothorax. Left-sided pacing device as before. Esophageal tube tip below the diaphragm but non included. Bilateral right greater than left poorly defined and somewhat nodular opacities without significant change. Stable cardiomediastinal silhouette with aortic atherosclerosis. No pneumothorax. Right shoulder replacement. IMPRESSION: 1. Right central venous catheter tip partially obscured by overlying cardiac pacing leads. Tip appears to be within the right atrium. No pneumothorax. 2. No change in bilateral right greater than left multifocal and somewhat nodular infiltrates. Electronically Signed   By: Jasmine PangKim  Fujinaga M.D.   On: 015-May-2020 20:44   Dg Chest Portable 1 View  Result Date: 05-Aug-2018 CLINICAL DATA:  Post intubation pneumonia. EXAM: PORTABLE CHEST 1 VIEW COMPARISON:  015-May-2020 FINDINGS: Endotracheal tube has tip 2.2 cm above the carina. Enteric tube courses into the stomach and off the film as tip is not visualized. Left-sided pacemaker unchanged. Lungs are  hypoinflated and demonstrate persistent patchy bilateral airspace opacification right worse than left suggesting multifocal infection. No effusion or pneumothorax. Cardiomediastinal silhouette and remainder of the exam is unchanged. IMPRESSION: Hypoinflation with persistent bilateral patchy airspace process right worse than left suggesting multifocal infection. Tubes and lines as described. Electronically Signed   By: Marin Olp M.D.   On: Oct 03, 2018 15:46   Dg Chest Portable 1 View  Result Date: 10-03-2018 CLINICAL DATA:  Worsening shortness of breath over the past 3 days. EXAM: PORTABLE CHEST 1 VIEW COMPARISON:  04/09/2018 FINDINGS: Left-sided pacemaker unchanged. Lungs are somewhat hypoinflated demonstrate patchy airspace opacification over the mid to upper lungs right worse than left. No effusion. Cardiomediastinal silhouette and remainder of the exam is unchanged. IMPRESSION: Patchy bilateral airspace process over the mid to upper lungs right worse than left likely multifocal pneumonia. Electronically Signed   By: Marin Olp M.D.   On: 2018/10/03 11:51    Microbiology Recent Results (from the past 240 hour(s))  SARS Coronavirus 2 (CEPHEID - Performed in [place] hospital lab), Hosp Order     Status: None   Collection Time: 10-03-18 10:38 AM   Specimen: Nasopharyngeal Swab  Result Value Ref Range Status   SARS Coronavirus 2 NEGATIVE NEGATIVE Final    Comment: (NOTE) If result is NEGATIVE SARS-CoV-2 target nucleic acids are NOT DETECTED. The SARS-CoV-2 RNA is generally detectable in upper and lower  respiratory specimens during the acute phase of infection. The lowest  concentration of SARS-CoV-2 viral copies this assay can detect is 250  copies / mL. A negative result does not preclude SARS-CoV-2 infection  and should not be used as the sole basis for treatment or other  patient management decisions.  A negative result may occur with  improper specimen collection / handling,  submission of specimen other  than nasopharyngeal swab, presence of viral mutation(s) within the  areas targeted by this assay, and inadequate number of viral copies  (<250 copies / mL). A negative result must be combined with clinical  observations, patient history, and epidemiological information. If result is POSITIVE SARS-CoV-2 target nucleic acids are DETECTED. The SARS-CoV-2 RNA is generally detectable in upper and lower  respiratory specimens dur ing the acute phase of infection.  Positive  results are indicative of active infection with SARS-CoV-2.  Clinical  correlation with patient history and other diagnostic information is  necessary to determine patient infection status.  Positive results do  not rule out bacterial infection or co-infection with other viruses. If result is PRESUMPTIVE POSTIVE SARS-CoV-2 nucleic acids MAY BE PRESENT.   A presumptive positive result was obtained on the submitted specimen  and confirmed on repeat testing.  While 2019 novel coronavirus  (SARS-CoV-2) nucleic acids may be present in the submitted sample  additional confirmatory testing may be necessary for epidemiological  and / or clinical management purposes  to differentiate between  SARS-CoV-2 and other Sarbecovirus currently known to infect humans.  If clinically indicated additional testing with an alternate test  methodology [phone]) is advised. The SARS-CoV-2 RNA is generally  detectable in upper and lower respiratory sp ecimens during the acute  phase of infection. The expected result is Negative. Fact Sheet for Patients:  StrictlyIdeas.no Fact Sheet for Healthcare Providers: BankingDealers.co.za This test is not yet approved or cleared by the [place] FDA and has been authorized for detection and/or diagnosis of SARS-CoV-2 by FDA under an Emergency Use Authorization (EUA).  This EUA will remain in effect (meaning this test can be  used) for the duration of the COVID-19 declaration under Section 564(b)(1) of the Act, 21 U.S.C. section 360bbb-3(b)(1), unless the authorization is terminated or revoked sooner. Performed at [facility], [address], [place], [place] [address]   Blood Culture (routine x 2)     Status: None (Preliminary result)   Collection Time: 09-16-18  1:03 PM   Specimen: BLOOD  Result Value Ref Range Status   Specimen Description BLOOD SITE NOT SPECIFIED  Final   Special Requests   Final    BOTTLES DRAWN AEROBIC AND ANAEROBIC Blood Culture adequate volume   Culture  Setup Time   Final    GRAM POSITIVE COCCI IN BOTH AEROBIC AND ANAEROBIC BOTTLES Organism ID to follow Performed at [facility], [address], [place], [place] [address]    Culture PENDING  Incomplete   Report Status PENDING  Incomplete  MRSA PCR Screening     Status: Abnormal   Collection Time: 2018-09-16  3:58 PM   Specimen: Nasal Mucosa; Nasopharyngeal  Result Value Ref Range Status   MRSA by PCR POSITIVE (A) NEGATIVE Final    Comment:        The GeneXpert MRSA Assay (FDA approved for NASAL specimens only), is one component of a comprehensive MRSA colonization surveillance program. It is not intended to diagnose MRSA infection nor to guide or monitor treatment for MRSA infections. CRITICAL RESULT CALLED TO, READ BACK BY AND VERIFIED WITH: RN [facility] ELIAS 1940 [phone] FCP  Performed at [facility], [address], [place], [place] [address]     Lab Basic Metabolic Panel: Recent Labs  Lab 2018/09/16 1045 2018-09-16 1048 09/16/2018 1640 Sep 16, 2018 1659 September 16, 2018 2250 09/16/18 2300  NA 133* 132* 136 131* 145 140  K 4.9 4.7 5.0 4.8 4.1 4.0  CL 98 102 102  --  101  --   CO2 16*  --  11*  --  15*  --   GLUCOSE 82 80 76  --  157*  --   BUN 59* 54* 67*  --  70*  --   CREATININE 3.88* 3.50* 4.26*  --  5.01*  --   CALCIUM 8.0*  --  8.2*  --  6.9*  --   MG  --   --   --   --  4.1*  --   PHOS  --   --    --   --  11.0*  --    Liver Function Tests: Recent Labs  Lab 09-16-2018 1640 2018/09/16 2250  AST 72* PENDING  ALT 22 PENDING  ALKPHOS 67 44  BILITOT 1.8* 0.8  PROT 6.4* 3.3*  ALBUMIN 3.1* 1.7*   No results for input(s): LIPASE, AMYLASE in the last 168 hours. No results for input(s): AMMONIA in the last 168 hours. CBC: Recent Labs  Lab 09/16/2018 1045 09/16/2018 1048 Sep 16, 2018 1640 2018/09/16 1659 September 16, 2018 2250 09-16-18 2300  WBC 3.7*  --  3.1*  --  4.2  --   HGB 14.9 16.0 16.5 16.0 11.4* 10.9*  HCT 46.3 47.0 50.3 47.0 36.1* 32.0*  MCV 95.5  --  93.5  --  100.6*  --   PLT 44*  --  41*  --  PENDING  --    Cardiac Enzymes: Recent Labs  Lab Sep 16, 2018 1045 09-16-18 1640 Sep 16, 2018 2304  TROPONINI 1.03* 4.38* 7.46*   Sepsis Labs: Recent Labs  Lab 09/16/2018 1045 09-16-2018 1130 16-Sep-2018 1640 09-16-18 2250 Sep 16, 2018 2304  PROCALCITON 15.95  --  19.80  --   --   WBC 3.7*  --  3.1* 4.2  --   LATICACIDVEN  --  4.2* 6.5*  --  >11.0*    Procedures/Operations  R IJ central venous catheter insertion (10/07/2018).   Marcelle SmilingSeong-Joo Domini Vandehei, MD Board Certified by the ABIM, Pulmonary Diseases & Critical Care Medicine  10/07/2018, 11:50 PM

## 2018-09-21 NOTE — ED Provider Notes (Signed)
[facility] Provider Note   CSN: [id number] Arrival date & time: 11-04-2018  1017    History   Chief Complaint Chief Complaint  Patient presents with  . Shortness of Breath    HPI Tommy Robbins is a 73 y.o. male.     HPI Patient reports he has had shortness of breath developing over the past week.  He does endorse cough.  He endorses subjective fever.  He denies chest pain.  He reports he has had some abdominal discomfort.  There has been some achiness.  This is been progressively worse to the point that now his chest feels very congested and he is having a lot of difficulty breathing.  Patient is coming from home.  Upon EMS arrival oxygen saturations were in the 80s on room air.  Patient placed on a nonrebreather mask.  EMR review indicates severe coronary artery disease last EF 30 to 40%.  None of the patient's lesions were amenable to interventional management. Multiple other medical comorbidities.  I was able to review the patient's history of present illness with his niece who is a Advice workerphysician assistant for the emergency department.  She reports that 1 of his main complaints have been low back pain for the past 4 days.  He had decided that he had strained lifting.  He does however have history of chronic pain as well.  He had denied having any fever to her.  He does have history of CHF and about 3 weeks ago took 5 days of Lasix after developing shortness of breath and got better with resolution of symptoms.  She reports his baseline blood pressure is 90s over 60s and heart rate over 50s.  She reports he checks his blood pressure daily due to recommendations for very tight blood pressure management with his severe coronary artery disease. Past Medical History:  Diagnosis Date  . AICD (automatic cardioverter/defibrillator) present 03/24/2017  . Cataract   . Cellulitis and abscess of right leg 10/06/2016   right thigh  . Chronic low back pain   .  Colon polyps   . Depression   . Diverticulosis   . GERD (gastroesophageal reflux disease)   . Hepatitis C   . History of sudden cardiac arrest 10/25/2016   witness/notes 02/10/2017  . Hx of substance abuse (HCC)    [name]/notes 02/10/2017  . Hypertension   . Insomnia   . Thyroid disease    hypothyroid    Patient Active Problem List   Diagnosis Date Noted  . Gynecomastia 04/15/2018  . Cirrhosis of liver (HCC) 04/15/2018  . Abdominal pain 04/09/2018  . Cholecystitis 04/09/2018  . Ischemic cardiomyopathy 03/24/2017  . CAD (coronary artery disease), native coronary artery 11/06/2016  . Systolic CHF (HCC) 11/06/2016  . Acute ST elevation myocardial infarction (STEMI) involving left anterior descending (LAD) coronary artery (HCC) 10/25/2016  . Actinic keratosis 07/25/2015  . Chronic low back pain   . COPD (chronic obstructive pulmonary disease) (HCC) 09/11/2012  . Hepatitis C 09/11/2012  . GERD (gastroesophageal reflux disease) 09/11/2012  . Insomnia 09/11/2012  . Hypothyroidism 09/20/2006  . HYPERTENSION, BENIGN ESSENTIAL 09/20/2006  . ECHOCARDIOGRAM, HX OF 09/20/2006    Past Surgical History:  Procedure Laterality Date  . CARDIAC CATHETERIZATION    . CARDIAC DEFIBRILLATOR PLACEMENT  03/24/2017  . IABP INSERTION N/A 10/24/2016   Procedure: IABP Insertion;  Surgeon: Runell GessBerry, Jonathan J, MD;  [facility];  Service: Cardiovascular;  Laterality: N/A;  . ICD IMPLANT  N/A 03/24/2017   MDT [place] MRI XT DR ICD implanted by Dr Johney Frame for secondary prevention (VF arrest)  . JOINT REPLACEMENT    . LEFT HEART CATH AND CORONARY ANGIOGRAPHY N/A 10/24/2016   Procedure: LEFT HEART CATH AND CORONARY ANGIOGRAPHY;  Surgeon: Runell Gess, MD;  Location: [facility] INVASIVE CV LAB;  Service: Cardiovascular;  Laterality: N/A;  . TOTAL SHOULDER REPLACEMENT Right 03/23/2008        Home Medications    Prior to Admission medications   Medication Sig Start Date End Date Taking? Authorizing  Provider  aspirin EC 81 MG tablet Take 81 mg by mouth daily.    [provider]  atorvastatin (LIPITOR) 80 MG tablet TAKE 1 TABLET DAILY AT [facility] 08/23/18   Salley Scarlet, MD  cycloSPORINE (RESTASIS) 0.05 % ophthalmic emulsion Place 1 drop into both eyes 2 (two) times daily.    [provider]  diclofenac sodium (VOLTAREN) 1 % GEL Apply 1 application topically daily as needed (for pain).    [provider]  ENTRESTO 24-26 MG TAKE 1 TABLET BY MOUTH TWICE A DAY 06/21/18   Bensimhon, Bevelyn Buckles, MD  EPCLUSA 400-100 MG TABS  05/13/18   [provider]  furosemide (LASIX) 20 MG tablet Take 1 tablet (20 mg total) by mouth daily as needed. Patient taking differently: Take 20 mg by mouth daily as needed for fluid or edema.  01/20/17 11/11/18  Azalee Course, PA  gabapentin (NEURONTIN) 100 MG capsule Take 200 mg by mouth at bedtime. Every other night. 03/03/17   [provider]  isosorbide mononitrate (IMDUR) 30 MG 24 hr tablet TAKE 1 TABLET BY MOUTH EVERY DAY Patient taking differently: Take 30 mg by mouth daily.  12/20/17   Azalee Course, PA  ketoconazole (NIZORAL) 2 % shampoo APPLY TOPICALLY 2 TIMES A WEEK. 07/28/18   Salley Scarlet, MD  lidocaine (LIDODERM) 5 % Place 3 patches onto the skin daily as needed (pain).  10/17/14   [provider]  metoprolol succinate (TOPROL-XL) 50 MG 24 hr tablet Take one tablet daily with food 12/06/17   Runell Gess, MD  morphine (MS CONTIN) 15 MG 12 hr tablet Take 15 mg by mouth See admin instructions. Take 15 mg by mouth in the morning with 30 mg to equal 45 mg dose 06/30/15   [provider]  morphine (MS CONTIN) 30 MG 12 hr tablet Take 30 mg by mouth See admin instructions. Takes 30 mg by mouth in the morning with 15 mg to equal 45 mg dose, 30 mg in the afternoon, and 30 mg at night    [provider]  mupirocin ointment (BACTROBAN) 2 % Apply 1 application topically 2 (two) times daily. 06/01/18   Danelle Berry,  PA-C  nitroGLYCERIN (NITROSTAT) 0.4 MG SL tablet Place 1 tablet (0.4 mg total) under the tongue every 5 (five) minutes as needed for chest pain. 01/20/17   Azalee Course, PA  Nutritional Supplements (JUICE PLUS FIBRE PO) Take 2 capsules by mouth 2 (two) times daily.    [provider]  spironolactone (ALDACTONE) 25 MG tablet TAKE 1 TABLET DAILY 05/16/18   [place], Velna Hatchet, MD  SYNTHROID 88 MCG tablet TAKE 1 TABLET DAILY BEFORE BREAKFAST Patient taking differently: Take 88 mcg by mouth daily before breakfast.  04/04/18   [place], Velna Hatchet, MD  temazepam (RESTORIL) 15 MG capsule Take 1 capsule (15 mg total) by mouth at bedtime as needed for sleep. Patient taking differently: Take 15  mg by mouth at bedtime as needed for sleep. Every other night 07/06/18   Salley Scarleturham, Kawanta F, MD  traZODone (DESYREL) 50 MG tablet Take 25 mg by mouth at bedtime.  09/02/16   [provider]  varenicline (CHANTIX STARTING MONTH PAK) 0.5 MG X 11 & 1 MG X 42 tablet Take one 0.5 mg tablet by mouth once daily for 3 days, then increase to one 0.5 mg tablet twice daily for 4 days, then increase to one 1 mg tablet twice daily. 04/15/18   Salley Scarleturham, Kawanta F, MD    Family History Family History  Problem Relation Age of Onset  . Arthritis Father        rheumatoid  . Hypertension Sister   . Cancer Sister   . Scoliosis Sister   . Arthritis Sister   . Osteoarthritis Mother   . Arthritis/Rheumatoid Sister   . Peripheral vascular disease Sister   . Arrhythmia Sister   . CAD Brother     Social History Social History   Tobacco Use  . Smoking status: Former Smoker    Years: 53.00    Types: Cigarettes    Quit date: 04/19/2017    Years since quitting: 1.3  . Smokeless tobacco: Never Used  Substance Use Topics  . Alcohol use: Yes    Alcohol/week: 5.0 standard drinks    Types: 5 Cans of beer per week  . Drug use: Yes    Types: Marijuana    Comment: h/o IVDA; "no IVDAsince the 1970s; last smoked pot ~ 10/2016"      Allergies   Other   Review of Systems Review of Systems 10 Systems reviewed and are negative for acute change except as noted in the HPI.  Physical Exam Updated Vital Signs BP 111/72   Pulse (!) 119   Temp 98.3 F (36.8 C) (Oral)   Resp (!) 22   Ht 5\' 10"  (1.778 m)   Wt 77.1 kg   SpO2 98%   BMI 24.39 kg/m   Physical Exam Constitutional:      Comments: Frail in appearance.  Patient appears moderately ill and fatigued with increased work of breathing.  Mental status is situationally oriented but patient appears slightly confused.  HENT:     Head: Normocephalic and atraumatic.  Eyes:     Extraocular Movements: Extraocular movements intact.  Cardiovascular:     Pulses: Normal pulses.     Comments: Tachycardia.  Distant heart sounds cannot appreciate gross rub murmur gallop.  pulmonary noise. Pulmonary:     Comments: Lung sounds very congested with rhonchi and crackles throughout. Abdominal:     General: There is no distension.     Palpations: Abdomen is soft.     Tenderness: There is no abdominal tenderness. There is no guarding.  Musculoskeletal:     Comments: No peripheral edema.  Calves are soft and nontender.  Skin:    General: Skin is warm and dry.  Neurological:     Comments: No focal neurologic deficit.  Patient does appear fatigued and mildly confused but is following commands.  He is generally weak.  No localizing deficits.      ED Treatments / Results  Labs (all labs ordered are listed, but only abnormal results are displayed) Labs Reviewed  BASIC METABOLIC PANEL - Abnormal; Notable for the following components:      Result Value   Sodium 133 (*)    CO2 16 (*)    BUN 59 (*)    Creatinine, Ser 3.88 (*)  Calcium 8.0 (*)    GFR calc non Af Amer 15 (*)    GFR calc Af Amer 17 (*)    Anion gap 19 (*)    All other components within normal limits  CBC - Abnormal; Notable for the following components:   WBC 3.7 (*)    Platelets 44 (*)    All other  components within normal limits  BRAIN NATRIURETIC PEPTIDE - Abnormal; Notable for the following components:   B Natriuretic Peptide 546.7 (*)    All other components within normal limits  TROPONIN I - Abnormal; Notable for the following components:   Troponin I 1.03 (*)    All other components within normal limits  LACTIC ACID, PLASMA - Abnormal; Notable for the following components:   Lactic Acid, Venous 4.2 (*)    All other components within normal limits  D-DIMER, QUANTITATIVE (NOT AT [facility]) - Abnormal; Notable for the following components:   D-Dimer, Quant >20.00 (*)    All other components within normal limits  LACTATE DEHYDROGENASE - Abnormal; Notable for the following components:   LDH 312 (*)    All other components within normal limits  TRIGLYCERIDES - Abnormal; Notable for the following components:   Triglycerides 189 (*)    All other components within normal limits  I-STAT CHEM 8, ED - Abnormal; Notable for the following components:   Sodium 132 (*)    BUN 54 (*)    Creatinine, Ser 3.50 (*)    Calcium, Ion 0.91 (*)    TCO2 19 (*)    All other components within normal limits  SARS CORONAVIRUS 2 (HOSPITAL ORDER, [facility])  CULTURE, BLOOD (ROUTINE X 2)  CULTURE, BLOOD (ROUTINE X 2)  FIBRINOGEN  LACTIC ACID, PLASMA  PROCALCITONIN  C-REACTIVE PROTEIN  FERRITIN    EKG EKG Interpretation  Date/Time:  Sep 11, 2018 10:18:16 EDT Ventricular Rate:  122 PR Interval:    QRS Duration: 92 QT Interval:  275 QTC Calculation: 392 R Axis:   1 Text Interpretation:  Sinus tachycardia Low voltage, extremity and precordial leads RSR' in V1 or V2, probably normal variant Consider inferior infarct similar to old. subtle ST elevation inferior  Confirmed by Charlesetta Shanks [phone]) on 09-11-18 12:21:42 PM   Radiology Dg Chest Portable 1 View  Result Date: 2018/09/11 CLINICAL DATA:  Worsening shortness of breath over the past 3 days. EXAM:  PORTABLE CHEST 1 VIEW COMPARISON:  04/09/2018 FINDINGS: Left-sided pacemaker unchanged. Lungs are somewhat hypoinflated demonstrate patchy airspace opacification over the mid to upper lungs right worse than left. No effusion. Cardiomediastinal silhouette and remainder of the exam is unchanged. IMPRESSION: Patchy bilateral airspace process over the mid to upper lungs right worse than left likely multifocal pneumonia. Electronically Signed   By: Marin Olp M.D.   On: 09/11/18 11:51    Procedures Procedures (including critical care time) CRITICAL CARE Performed by: Charlesetta Shanks   Total critical care time: 45 minutes  Critical care time was exclusive of separately billable procedures and treating other patients.  Critical care was necessary to treat or prevent imminent or life-threatening deterioration.  Critical care was time spent personally by me on the following activities: development of treatment plan with patient and/or surrogate as well as nursing, discussions with consultants, evaluation of patient's response to treatment, examination of patient, obtaining history from patient or surrogate, ordering and performing treatments and interventions, ordering and review of laboratory studies, ordering and review of radiographic studies, pulse oximetry  and re-evaluation of patient's condition.  Medications Ordered in ED Medications  cefTRIAXone (ROCEPHIN) 1 g in sodium chloride 0.9 % 100 mL IVPB (1 g Intravenous New Bag/Given 2018-09-12 1223)  azithromycin (ZITHROMAX) 500 mg in sodium chloride 0.9 % 250 mL IVPB (has no administration in time range)  furosemide (LASIX) injection 40 mg (40 mg Intravenous Given 2018-09-12 1200)     Initial Impression / Assessment and Plan / ED Course  I have reviewed the triage vital signs and the nursing notes.  Pertinent labs & imaging results that were available during my care of the patient were reviewed by me and considered in my medical decision making  (see chart for details).  Clinical Course as of Sep 11 1120  Sat Sep 10, 2018  1246 Subjectively patient denies much improvement on BiPAP.   [MP]  1250 Consult: Intensivist Dr. Delton CoombesByrum.  Recommends to continue the BiPAP and defer intubation until seen by intensivists.   [MP]    Clinical Course User Index [MP] Arby BarrettePfeiffer, Brennley Curtice, MD      Patient severely ill with respiratory failure.  Potentially multifactorial with known severe coronary artery disease and patchy infiltrates consistent with pneumonia.  Differential includes congestive heart failure, COVID, bacterial pneumonia.  Jessee Judie PetitM Kropp was evaluated in Emergency Department on 09/12/2018 for the symptoms described in the history of present illness. He was evaluated in the context of the global COVID-19 pandemic, which necessitated consideration that the patient might be at risk for infection with the SARS-CoV-2 virus that causes COVID-19. Institutional protocols and algorithms that pertain to the evaluation of patients at risk for COVID-19 are in a state of rapid change based on information released by regulatory bodies including the CDC and federal and state organizations. These policies and algorithms were followed during the patient's care in the ED.   Final Clinical Impressions(s) / ED Diagnoses   Final diagnoses:  Acute hypoxemic respiratory failure [facility](HCC)    ED Discharge Orders    None       Arby BarrettePfeiffer, Lacretia Tindall, MD 09/12/18 1125

## 2018-09-21 DEATH — deceased

## 2018-10-14 ENCOUNTER — Telehealth: Payer: BC Managed Care – PPO | Admitting: Cardiovascular Disease

## 2019-01-24 ENCOUNTER — Ambulatory Visit (INDEPENDENT_AMBULATORY_CARE_PROVIDER_SITE_OTHER): Payer: BC Managed Care – PPO | Admitting: Internal Medicine

## 2020-04-28 IMAGING — US US ABDOMEN LIMITED
1 series · 13 of 25 positions shown · non-contrast
Comparison: None.

CLINICAL DATA: Right upper quadrant pain.

EXAM:
ULTRASOUND ABDOMEN LIMITED RIGHT UPPER QUADRANT

[Series 1: us abdomen limited · 0.25mm/px · 13 of 53 slices shown]
[im 1/53]
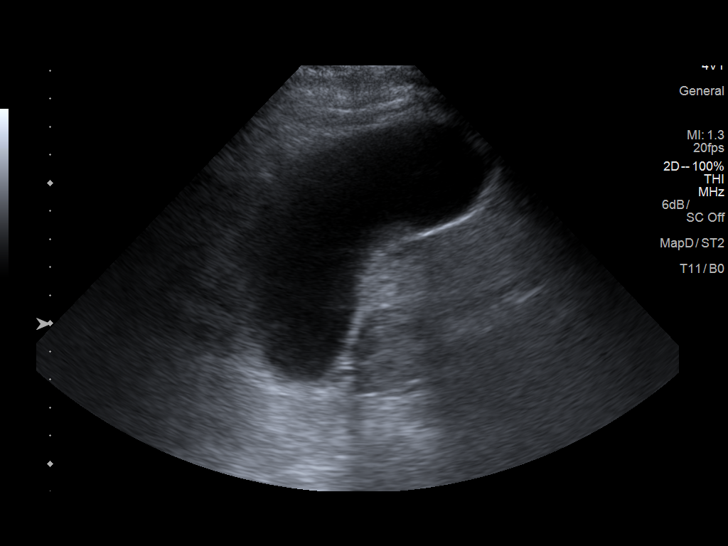
[im 5/53]
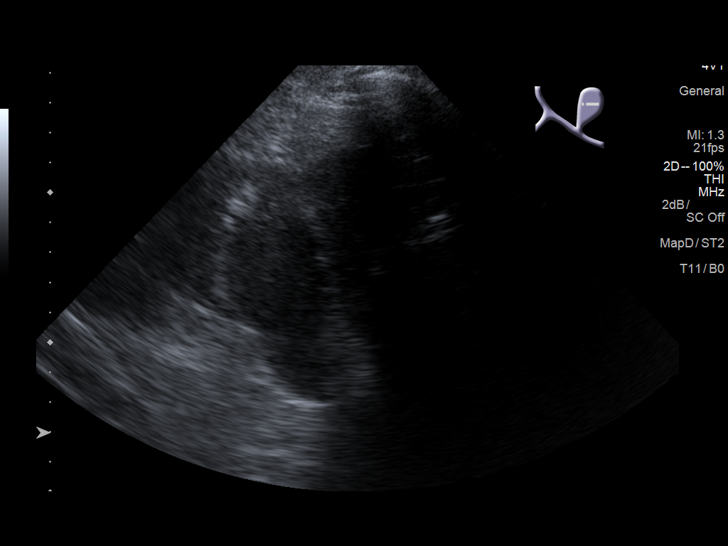
[im 9/53]
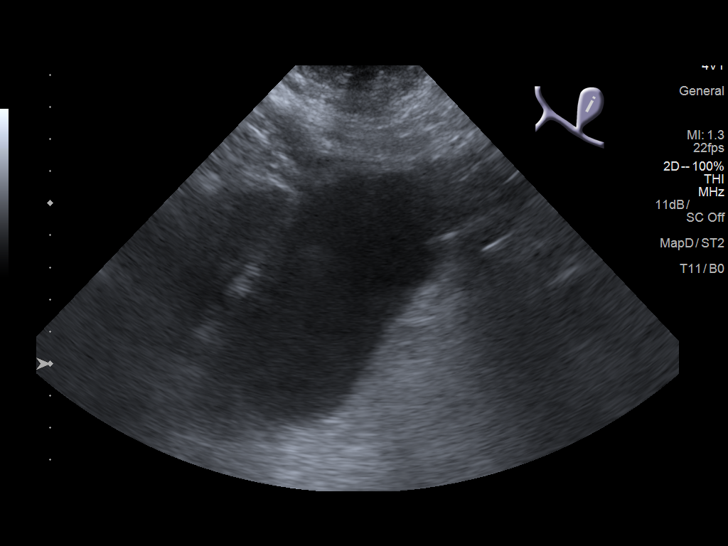
[im 14/53]
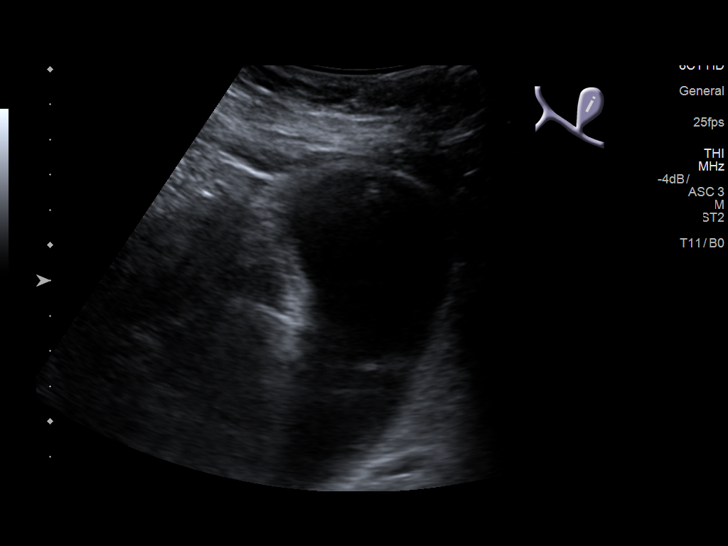
[im 18/53]
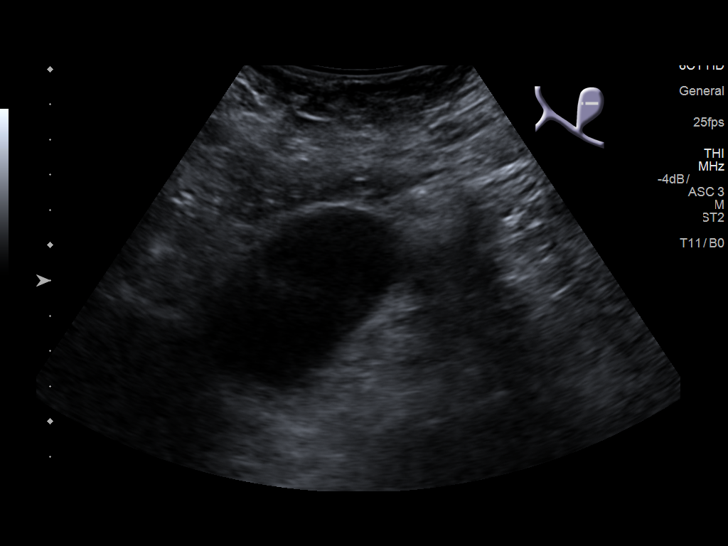
[im 22/53]
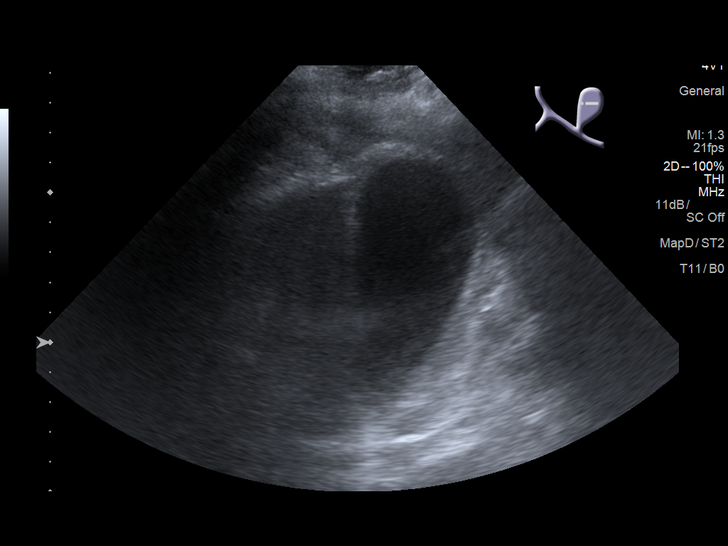
[im 27/53]
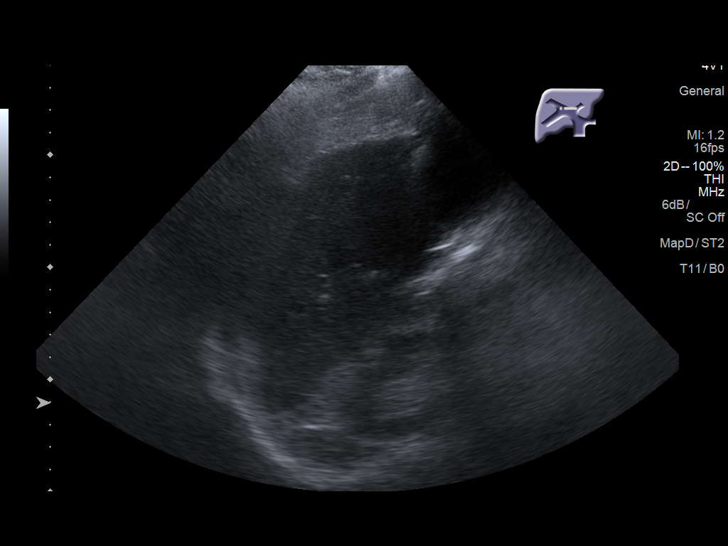
[im 31/53]
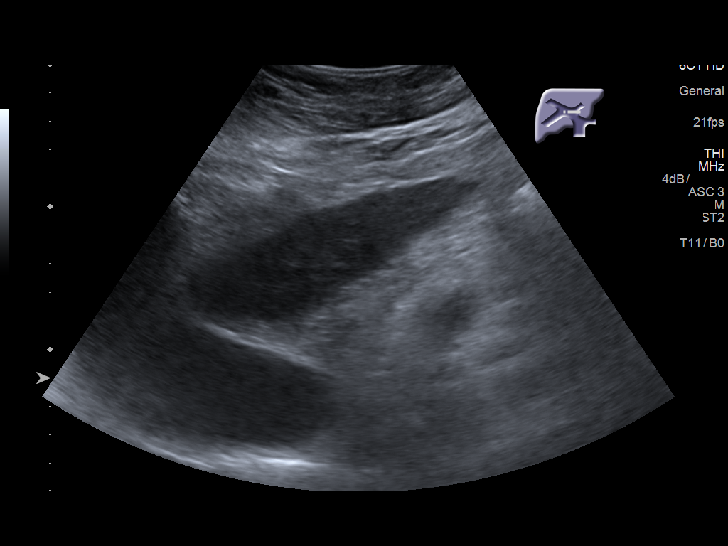
[im 35/53]
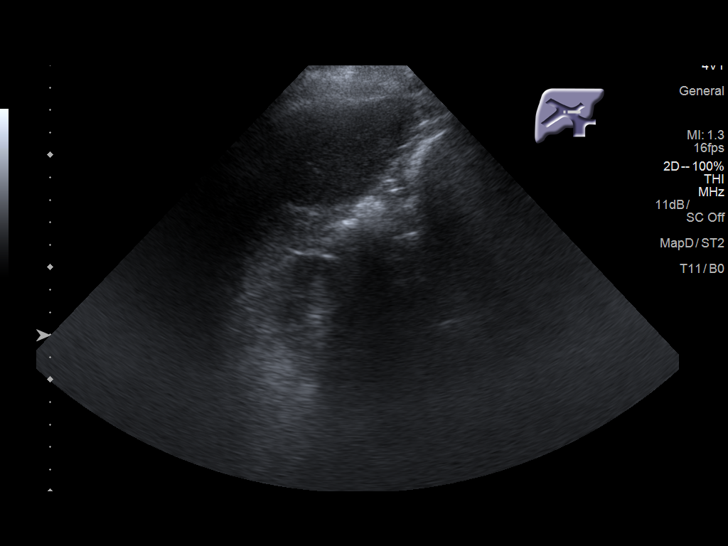
[im 40/53]
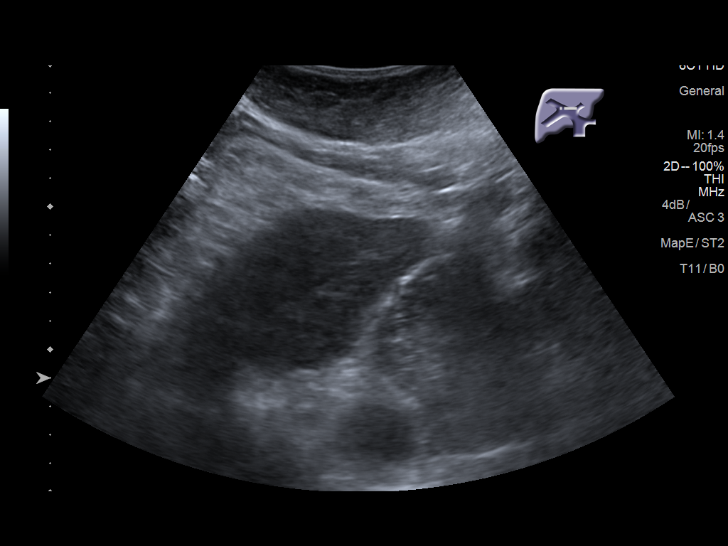
[im 44/53]
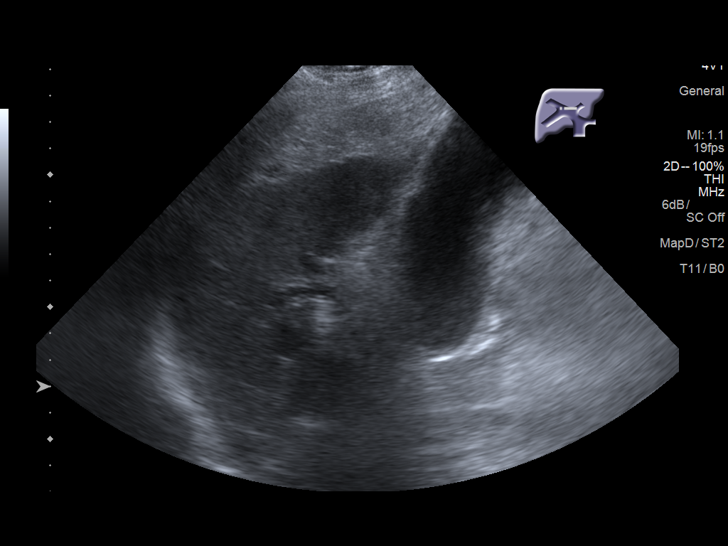
[im 48/53]
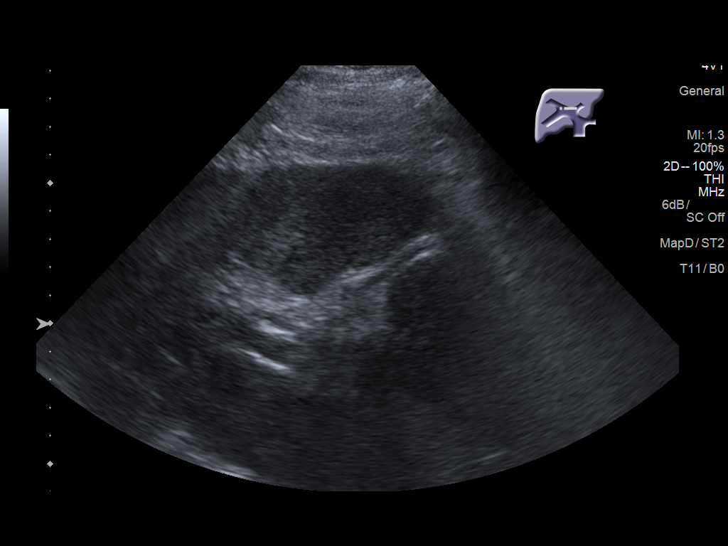
[im 53/53]
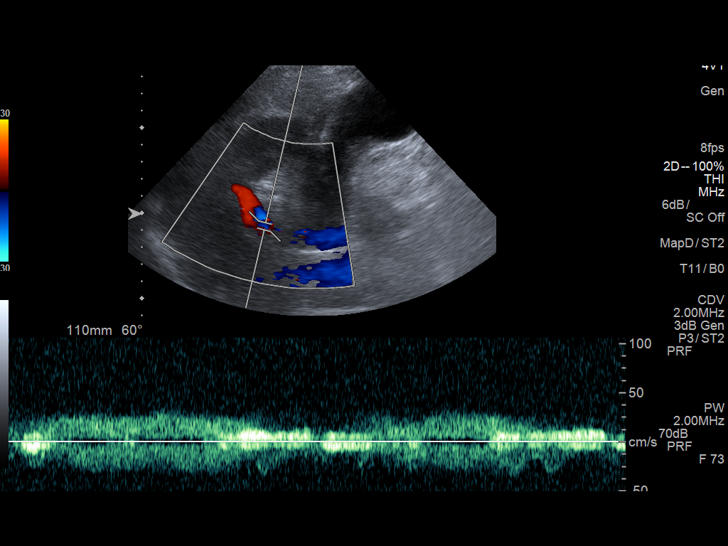

[13 of 25 positions shown; findings below may reference images not displayed]

FINDINGS: Gallbladder:

The gallbladder is distended without wall thickening. The wall
measures 2 mm. There may be a tiny amount of pericholecystic fluid.
A positive Murphy's sign is reported. There is sludge in the
gallbladder with no stones.

Common bile duct:

Diameter: 6.3 mm

Liver:

A nodular contour and heterogeneous echotexture of the liver is
consistent with known cirrhosis. No focal mass. The umbilical vein
is recannulized. The portal vein is patent but appears to contain
bidirectional flow.
IMPRESSION: 1. Findings associated with the gallbladder are mixed. Gallbladder
distension, gallbladder sludge, a positive Murphy's sign, and the
possibility of minimal pericholecystic fluid all raise the
possibility of acute cholecystitis. However, there is no wall
thickening or stones. A HIDA scan could further evaluate as
clinically warranted.
2. Cirrhotic liver.
3. Recannulization of the umbilical vein and bidirectional flow in
the portal vein are consistent with portal venous hypertension.
4. The common bile duct is borderline in caliber. Recommend
correlation with labs.

## 2020-07-26 IMAGING — CR PORTABLE CHEST - 1 VIEW
1 series · 1 of 1 positions shown · non-contrast
Comparison: 04/09/2018

CLINICAL DATA: Worsening shortness of breath over the past 3 days.

EXAM:
PORTABLE CHEST 1 VIEW

[AP]
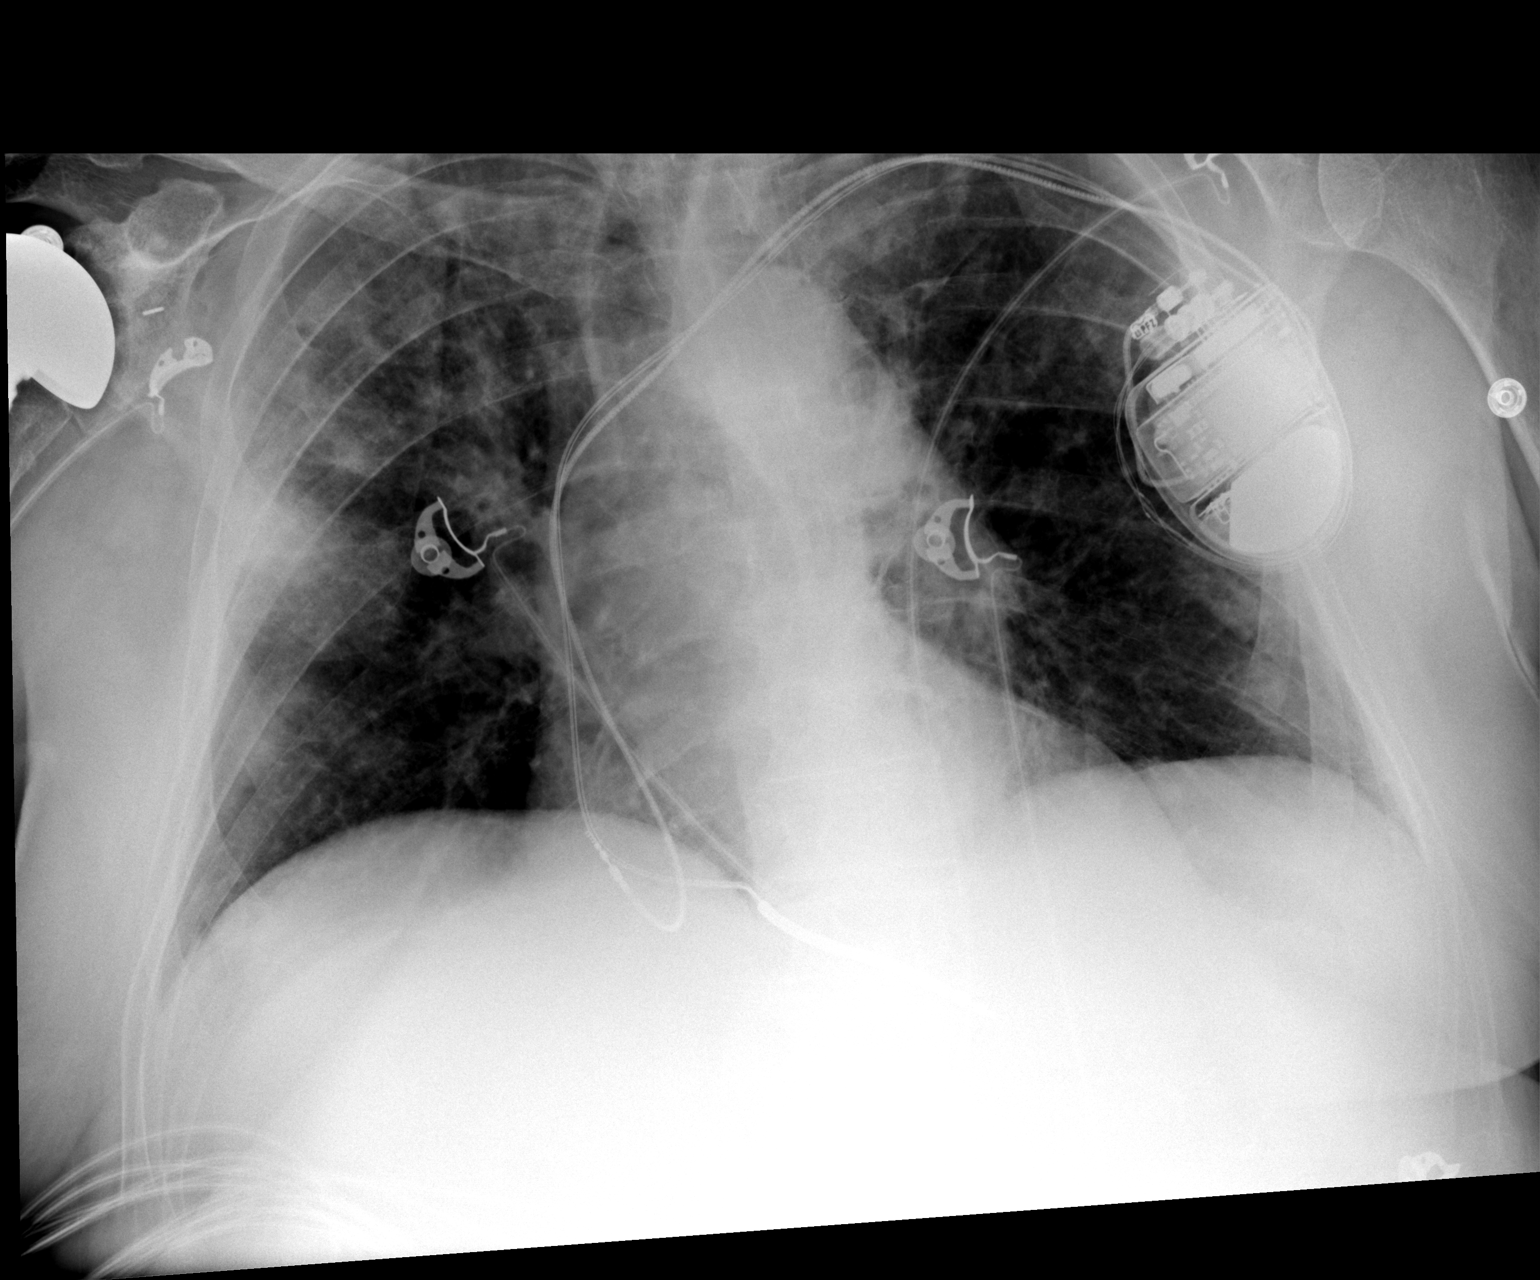

[1 of 1 positions shown; findings below may reference images not displayed]

FINDINGS: Left-sided pacemaker unchanged. Lungs are somewhat hypoinflated
demonstrate patchy airspace opacification over the mid to upper
lungs right worse than left. No effusion. Cardiomediastinal
silhouette and remainder of the exam is unchanged.
IMPRESSION: Patchy bilateral airspace process over the mid to upper lungs right
worse than left likely multifocal pneumonia.

## 2020-07-26 IMAGING — DX PORTABLE CHEST - 1 VIEW
1 series · 1 of 1 positions shown · non-contrast
Comparison: 09/10/2018, 04/09/2018, CT 04/09/2018

CLINICAL DATA: Central line

EXAM:
PORTABLE CHEST 1 VIEW

[chest ap]
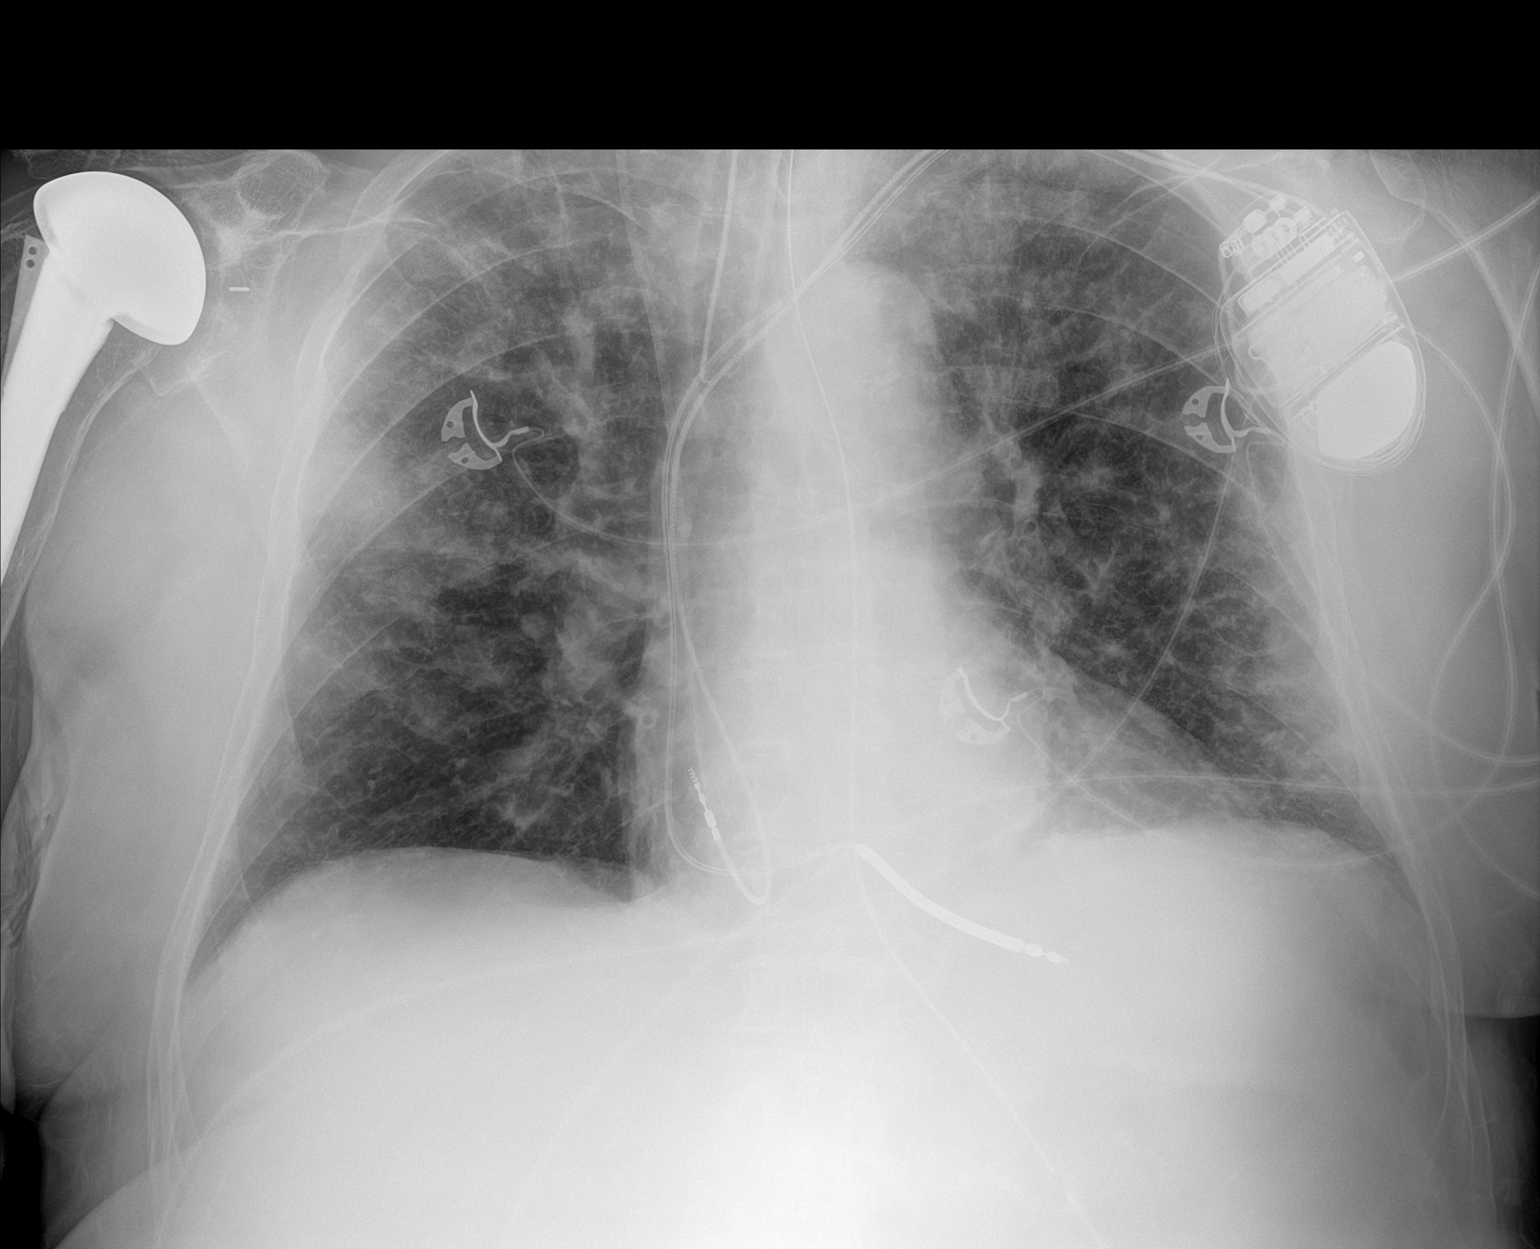

[1 of 1 positions shown; findings below may reference images not displayed]

FINDINGS: Endotracheal tube tip is about 2 cm superior to the carina. Right IJ
central venous catheter tip is partially obscured by cardiac pacing
leads. Tip may be within the right atrium. No pneumothorax.
Left-sided pacing device as before. Esophageal tube tip below the
diaphragm but non included. Bilateral right greater than left poorly
defined and somewhat nodular opacities without significant change.
Stable cardiomediastinal silhouette with aortic atherosclerosis. No
pneumothorax. Right shoulder replacement.
IMPRESSION: 1. Right central venous catheter tip partially obscured by overlying
cardiac pacing leads. Tip appears to be within the right atrium. No
pneumothorax.
2. No change in bilateral right greater than left multifocal and
somewhat nodular infiltrates.
